# Patient Record
Sex: Female | Born: 1935 | Race: White | Hispanic: No | Smoking: Former smoker
Health system: Southern US, Community
[De-identification: ages and names within clinical notes are randomized; demographics above are authoritative.]

## PROBLEM LIST (undated history)

## (undated) DIAGNOSIS — I480 Paroxysmal atrial fibrillation: Secondary | ICD-10-CM

## (undated) DIAGNOSIS — E119 Type 2 diabetes mellitus without complications: Secondary | ICD-10-CM

## (undated) DIAGNOSIS — I10 Essential (primary) hypertension: Secondary | ICD-10-CM

## (undated) DIAGNOSIS — E785 Hyperlipidemia, unspecified: Secondary | ICD-10-CM

## (undated) DIAGNOSIS — I2510 Atherosclerotic heart disease of native coronary artery without angina pectoris: Secondary | ICD-10-CM

## (undated) DIAGNOSIS — I509 Heart failure, unspecified: Secondary | ICD-10-CM

## (undated) DIAGNOSIS — J449 Chronic obstructive pulmonary disease, unspecified: Secondary | ICD-10-CM

## (undated) HISTORY — PX: BREAST EXCISIONAL BIOPSY: SUR124

## (undated) HISTORY — PX: ABDOMINAL HYSTERECTOMY: SHX81

## (undated) HISTORY — DX: Heart failure, unspecified: I50.9

---

## 2004-05-13 ENCOUNTER — Ambulatory Visit: Payer: Self-pay | Admitting: Internal Medicine

## 2004-06-30 ENCOUNTER — Ambulatory Visit: Payer: Self-pay | Admitting: Unknown Physician Specialty

## 2004-12-10 ENCOUNTER — Ambulatory Visit: Payer: Self-pay | Admitting: Cardiology

## 2005-05-27 ENCOUNTER — Ambulatory Visit: Payer: Self-pay | Admitting: Internal Medicine

## 2005-06-14 ENCOUNTER — Ambulatory Visit: Payer: Self-pay | Admitting: Internal Medicine

## 2005-07-01 ENCOUNTER — Ambulatory Visit: Payer: Self-pay | Admitting: Internal Medicine

## 2005-11-16 ENCOUNTER — Encounter: Payer: Self-pay | Admitting: Internal Medicine

## 2005-12-10 ENCOUNTER — Encounter: Payer: Self-pay | Admitting: Internal Medicine

## 2005-12-20 ENCOUNTER — Ambulatory Visit: Payer: Self-pay | Admitting: Internal Medicine

## 2006-05-30 ENCOUNTER — Ambulatory Visit: Payer: Self-pay | Admitting: Internal Medicine

## 2006-06-11 ENCOUNTER — Ambulatory Visit: Payer: Self-pay | Admitting: Internal Medicine

## 2006-07-12 ENCOUNTER — Ambulatory Visit: Payer: Self-pay | Admitting: Internal Medicine

## 2006-08-04 ENCOUNTER — Ambulatory Visit: Payer: Self-pay | Admitting: Internal Medicine

## 2007-07-12 ENCOUNTER — Emergency Department: Payer: Self-pay | Admitting: Emergency Medicine

## 2007-07-12 ENCOUNTER — Other Ambulatory Visit: Payer: Self-pay

## 2007-10-16 ENCOUNTER — Ambulatory Visit: Payer: Self-pay | Admitting: Internal Medicine

## 2008-11-22 ENCOUNTER — Ambulatory Visit: Payer: Self-pay | Admitting: Nurse Practitioner

## 2009-05-23 ENCOUNTER — Ambulatory Visit: Payer: Self-pay | Admitting: Family Medicine

## 2010-02-18 ENCOUNTER — Ambulatory Visit: Payer: Self-pay | Admitting: Nurse Practitioner

## 2010-05-07 ENCOUNTER — Emergency Department: Payer: Self-pay | Admitting: Emergency Medicine

## 2010-09-25 ENCOUNTER — Ambulatory Visit: Payer: Self-pay | Admitting: Family Medicine

## 2011-08-16 ENCOUNTER — Ambulatory Visit: Payer: Self-pay | Admitting: Unknown Physician Specialty

## 2011-08-18 LAB — PATHOLOGY REPORT

## 2011-12-09 ENCOUNTER — Ambulatory Visit: Payer: Self-pay | Admitting: Family Medicine

## 2013-07-09 ENCOUNTER — Ambulatory Visit: Payer: Self-pay | Admitting: Family Medicine

## 2016-06-25 ENCOUNTER — Other Ambulatory Visit: Payer: Self-pay | Admitting: Family Medicine

## 2016-06-25 DIAGNOSIS — Z1231 Encounter for screening mammogram for malignant neoplasm of breast: Secondary | ICD-10-CM

## 2016-08-03 ENCOUNTER — Ambulatory Visit
Admission: RE | Admit: 2016-08-03 | Discharge: 2016-08-03 | Disposition: A | Discharge status: Home / Self Care | Payer: Medicare Other | Source: Ambulatory Visit | Attending: Family Medicine | Admitting: Family Medicine

## 2016-08-03 ENCOUNTER — Encounter: Payer: Self-pay | Admitting: Radiology

## 2016-08-03 DIAGNOSIS — Z1231 Encounter for screening mammogram for malignant neoplasm of breast: Secondary | ICD-10-CM | POA: Diagnosis not present

## 2017-06-30 ENCOUNTER — Other Ambulatory Visit: Payer: Self-pay | Admitting: Family Medicine

## 2017-06-30 DIAGNOSIS — Z1231 Encounter for screening mammogram for malignant neoplasm of breast: Secondary | ICD-10-CM

## 2018-05-01 ENCOUNTER — Encounter: Payer: Self-pay | Admitting: Internal Medicine

## 2018-05-01 ENCOUNTER — Emergency Department: Payer: Medicare Other

## 2018-05-01 ENCOUNTER — Other Ambulatory Visit: Payer: Self-pay

## 2018-05-01 ENCOUNTER — Inpatient Hospital Stay
Admission: EM | Admit: 2018-05-01 | Discharge: 2018-05-04 | DRG: 871 | Disposition: A | Discharge status: Home Health | Payer: Medicare Other | Attending: Internal Medicine | Admitting: Internal Medicine

## 2018-05-01 DIAGNOSIS — Z7901 Long term (current) use of anticoagulants: Secondary | ICD-10-CM

## 2018-05-01 DIAGNOSIS — I130 Hypertensive heart and chronic kidney disease with heart failure and stage 1 through stage 4 chronic kidney disease, or unspecified chronic kidney disease: Secondary | ICD-10-CM | POA: Diagnosis present

## 2018-05-01 DIAGNOSIS — N179 Acute kidney failure, unspecified: Secondary | ICD-10-CM

## 2018-05-01 DIAGNOSIS — J9601 Acute respiratory failure with hypoxia: Secondary | ICD-10-CM | POA: Diagnosis present

## 2018-05-01 DIAGNOSIS — Z87891 Personal history of nicotine dependence: Secondary | ICD-10-CM

## 2018-05-01 DIAGNOSIS — R0603 Acute respiratory distress: Secondary | ICD-10-CM | POA: Diagnosis not present

## 2018-05-01 DIAGNOSIS — I2510 Atherosclerotic heart disease of native coronary artery without angina pectoris: Secondary | ICD-10-CM | POA: Diagnosis present

## 2018-05-01 DIAGNOSIS — N183 Chronic kidney disease, stage 3 (moderate): Secondary | ICD-10-CM | POA: Diagnosis present

## 2018-05-01 DIAGNOSIS — E785 Hyperlipidemia, unspecified: Secondary | ICD-10-CM | POA: Diagnosis present

## 2018-05-01 DIAGNOSIS — Z515 Encounter for palliative care: Secondary | ICD-10-CM | POA: Diagnosis not present

## 2018-05-01 DIAGNOSIS — D649 Anemia, unspecified: Secondary | ICD-10-CM | POA: Diagnosis present

## 2018-05-01 DIAGNOSIS — J189 Pneumonia, unspecified organism: Secondary | ICD-10-CM

## 2018-05-01 DIAGNOSIS — E119 Type 2 diabetes mellitus without complications: Secondary | ICD-10-CM

## 2018-05-01 DIAGNOSIS — Z23 Encounter for immunization: Secondary | ICD-10-CM

## 2018-05-01 DIAGNOSIS — R6520 Severe sepsis without septic shock: Secondary | ICD-10-CM | POA: Diagnosis present

## 2018-05-01 DIAGNOSIS — Z66 Do not resuscitate: Secondary | ICD-10-CM | POA: Diagnosis present

## 2018-05-01 DIAGNOSIS — J9622 Acute and chronic respiratory failure with hypercapnia: Secondary | ICD-10-CM | POA: Diagnosis not present

## 2018-05-01 DIAGNOSIS — E1122 Type 2 diabetes mellitus with diabetic chronic kidney disease: Secondary | ICD-10-CM | POA: Diagnosis present

## 2018-05-01 DIAGNOSIS — J440 Chronic obstructive pulmonary disease with acute lower respiratory infection: Secondary | ICD-10-CM | POA: Diagnosis present

## 2018-05-01 DIAGNOSIS — J9621 Acute and chronic respiratory failure with hypoxia: Secondary | ICD-10-CM | POA: Diagnosis present

## 2018-05-01 DIAGNOSIS — J9602 Acute respiratory failure with hypercapnia: Secondary | ICD-10-CM | POA: Diagnosis not present

## 2018-05-01 DIAGNOSIS — I350 Nonrheumatic aortic (valve) stenosis: Secondary | ICD-10-CM | POA: Diagnosis present

## 2018-05-01 DIAGNOSIS — I5031 Acute diastolic (congestive) heart failure: Secondary | ICD-10-CM | POA: Diagnosis present

## 2018-05-01 DIAGNOSIS — I480 Paroxysmal atrial fibrillation: Secondary | ICD-10-CM | POA: Diagnosis present

## 2018-05-01 DIAGNOSIS — Z825 Family history of asthma and other chronic lower respiratory diseases: Secondary | ICD-10-CM

## 2018-05-01 DIAGNOSIS — J441 Chronic obstructive pulmonary disease with (acute) exacerbation: Secondary | ICD-10-CM

## 2018-05-01 DIAGNOSIS — A419 Sepsis, unspecified organism: Secondary | ICD-10-CM | POA: Diagnosis present

## 2018-05-01 DIAGNOSIS — R0602 Shortness of breath: Secondary | ICD-10-CM | POA: Diagnosis present

## 2018-05-01 DIAGNOSIS — Z833 Family history of diabetes mellitus: Secondary | ICD-10-CM

## 2018-05-01 DIAGNOSIS — Z800 Family history of malignant neoplasm of digestive organs: Secondary | ICD-10-CM

## 2018-05-01 DIAGNOSIS — D689 Coagulation defect, unspecified: Secondary | ICD-10-CM | POA: Diagnosis present

## 2018-05-01 DIAGNOSIS — I447 Left bundle-branch block, unspecified: Secondary | ICD-10-CM | POA: Diagnosis present

## 2018-05-01 DIAGNOSIS — I10 Essential (primary) hypertension: Secondary | ICD-10-CM | POA: Diagnosis present

## 2018-05-01 DIAGNOSIS — I5021 Acute systolic (congestive) heart failure: Secondary | ICD-10-CM | POA: Diagnosis present

## 2018-05-01 DIAGNOSIS — J9600 Acute respiratory failure, unspecified whether with hypoxia or hypercapnia: Secondary | ICD-10-CM

## 2018-05-01 DIAGNOSIS — Z7189 Other specified counseling: Secondary | ICD-10-CM | POA: Diagnosis not present

## 2018-05-01 HISTORY — DX: Essential (primary) hypertension: I10

## 2018-05-01 HISTORY — DX: Atherosclerotic heart disease of native coronary artery without angina pectoris: I25.10

## 2018-05-01 HISTORY — DX: Hyperlipidemia, unspecified: E78.5

## 2018-05-01 HISTORY — DX: Type 2 diabetes mellitus without complications: E11.9

## 2018-05-01 HISTORY — DX: Paroxysmal atrial fibrillation: I48.0

## 2018-05-01 HISTORY — DX: Chronic obstructive pulmonary disease, unspecified: J44.9

## 2018-05-01 LAB — URINALYSIS, ROUTINE W REFLEX MICROSCOPIC
Bacteria, UA: NONE SEEN
Bilirubin Urine: NEGATIVE
Glucose, UA: NEGATIVE mg/dL
Ketones, ur: NEGATIVE mg/dL
LEUKOCYTES UA: NEGATIVE
Nitrite: NEGATIVE
PH: 5 (ref 5.0–8.0)
Protein, ur: NEGATIVE mg/dL
SPECIFIC GRAVITY, URINE: 1.006 (ref 1.005–1.030)
SQUAMOUS EPITHELIAL / LPF: NONE SEEN (ref 0–5)

## 2018-05-01 LAB — COMPREHENSIVE METABOLIC PANEL
ALBUMIN: 3.3 g/dL — AB (ref 3.5–5.0)
ALT: 50 U/L — ABNORMAL HIGH (ref 0–44)
AST: 76 U/L — AB (ref 15–41)
Alkaline Phosphatase: 79 U/L (ref 38–126)
Anion gap: 13 (ref 5–15)
BILIRUBIN TOTAL: 0.6 mg/dL (ref 0.3–1.2)
BUN: 20 mg/dL (ref 8–23)
CHLORIDE: 98 mmol/L (ref 98–111)
CO2: 23 mmol/L (ref 22–32)
Calcium: 8.9 mg/dL (ref 8.9–10.3)
Creatinine, Ser: 1.63 mg/dL — ABNORMAL HIGH (ref 0.44–1.00)
GFR calc Af Amer: 33 mL/min — ABNORMAL LOW (ref >60)
GFR calc non Af Amer: 29 mL/min — ABNORMAL LOW (ref >60)
GLUCOSE: 255 mg/dL — AB (ref 70–99)
POTASSIUM: 4.4 mmol/L (ref 3.5–5.1)
Sodium: 134 mmol/L — ABNORMAL LOW (ref 135–145)
TOTAL PROTEIN: 6.7 g/dL (ref 6.5–8.1)

## 2018-05-01 LAB — CBC
HCT: 41.7 % (ref 36.0–46.0)
HEMOGLOBIN: 12.4 g/dL (ref 12.0–15.0)
MCH: 26.9 pg (ref 26.0–34.0)
MCHC: 29.7 g/dL — ABNORMAL LOW (ref 30.0–36.0)
MCV: 90.5 fL (ref 80.0–100.0)
Platelets: 247 10*3/uL (ref 150–400)
RBC: 4.61 MIL/uL (ref 3.87–5.11)
RDW: 15.3 % (ref 11.5–15.5)
WBC: 15.8 10*3/uL — ABNORMAL HIGH (ref 4.0–10.5)
nRBC: 0 % (ref 0.0–0.2)

## 2018-05-01 LAB — GLUCOSE, CAPILLARY: GLUCOSE-CAPILLARY: 225 mg/dL — AB (ref 70–99)

## 2018-05-01 LAB — PROTIME-INR
INR: 2
Prothrombin Time: 22.4 seconds — ABNORMAL HIGH (ref 11.4–15.2)

## 2018-05-01 LAB — LACTIC ACID, PLASMA: Lactic Acid, Venous: 3 mmol/L (ref 0.5–1.9)

## 2018-05-01 LAB — TROPONIN I: TROPONIN I: 0.03 ng/mL — AB (ref <0.03)

## 2018-05-01 MED ORDER — ACETAMINOPHEN 650 MG RE SUPP: 650.0000 mg | Freq: Four times a day (QID) | RECTAL | Status: DC | PRN

## 2018-05-01 MED ORDER — NYSTATIN 100000 UNIT/GM EX CREA
TOPICAL_CREAM | Freq: Two times a day (BID) | CUTANEOUS | Status: DC
  Administered 2018-05-02 – 2018-05-04 (×5): via TOPICAL
  Filled 2018-05-01: qty 15

## 2018-05-01 MED ORDER — FUROSEMIDE 10 MG/ML IJ SOLN
60.0000 mg | Freq: Once | INTRAMUSCULAR | Status: AC
  Administered 2018-05-01: 60 mg via INTRAVENOUS
  Filled 2018-05-01: qty 8

## 2018-05-01 MED ORDER — ONDANSETRON HCL 4 MG/2ML IJ SOLN: 4.0000 mg | Freq: Four times a day (QID) | INTRAMUSCULAR | Status: DC | PRN

## 2018-05-01 MED ORDER — ACETAMINOPHEN 650 MG RE SUPP
RECTAL | Status: AC
  Administered 2018-05-01: 650 mg
  Filled 2018-05-01: qty 1

## 2018-05-01 MED ORDER — IPRATROPIUM-ALBUTEROL 0.5-2.5 (3) MG/3ML IN SOLN: 3.0000 mL | RESPIRATORY_TRACT | Status: DC | PRN

## 2018-05-01 MED ORDER — IPRATROPIUM-ALBUTEROL 0.5-2.5 (3) MG/3ML IN SOLN
9.0000 mL | Freq: Once | RESPIRATORY_TRACT | Status: AC
  Administered 2018-05-01: 9 mL via RESPIRATORY_TRACT

## 2018-05-01 MED ORDER — SODIUM CHLORIDE 0.9 % IV SOLN
2.0000 g | Freq: Once | INTRAVENOUS | Status: AC
  Administered 2018-05-01: 2 g via INTRAVENOUS
  Filled 2018-05-01: qty 2

## 2018-05-01 MED ORDER — MOMETASONE FURO-FORMOTEROL FUM 100-5 MCG/ACT IN AERO
2.0000 | INHALATION_SPRAY | Freq: Two times a day (BID) | RESPIRATORY_TRACT | Status: DC
  Filled 2018-05-01: qty 8.8

## 2018-05-01 MED ORDER — ACETAMINOPHEN 325 MG PO TABS
650.0000 mg | ORAL_TABLET | Freq: Four times a day (QID) | ORAL | Status: DC | PRN
  Administered 2018-05-02 – 2018-05-04 (×3): 650 mg via ORAL
  Filled 2018-05-01 (×3): qty 2

## 2018-05-01 MED ORDER — SODIUM CHLORIDE 0.9 % IV SOLN: 1.0000 g | Freq: Three times a day (TID) | INTRAVENOUS | Status: DC

## 2018-05-01 MED ORDER — SODIUM CHLORIDE 0.9 % IV SOLN
2.0000 g | INTRAVENOUS | Status: DC
  Filled 2018-05-01: qty 2

## 2018-05-01 MED ORDER — INSULIN ASPART 100 UNIT/ML ~~LOC~~ SOLN
0.0000 [IU] | SUBCUTANEOUS | Status: DC
  Administered 2018-05-01 – 2018-05-02 (×2): 3 [IU] via SUBCUTANEOUS
  Administered 2018-05-02: 5 [IU] via SUBCUTANEOUS
  Administered 2018-05-02: 2 [IU] via SUBCUTANEOUS
  Administered 2018-05-02 – 2018-05-03 (×2): 1 [IU] via SUBCUTANEOUS
  Filled 2018-05-01 (×6): qty 1

## 2018-05-01 MED ORDER — METHYLPREDNISOLONE SODIUM SUCC 125 MG IJ SOLR
60.0000 mg | Freq: Four times a day (QID) | INTRAMUSCULAR | Status: DC
  Administered 2018-05-02 (×2): 60 mg via INTRAVENOUS
  Filled 2018-05-01 (×2): qty 2

## 2018-05-01 MED ORDER — INFLUENZA VAC SPLIT HIGH-DOSE 0.5 ML IM SUSY
0.5000 mL | PREFILLED_SYRINGE | INTRAMUSCULAR | Status: AC
  Administered 2018-05-02: 0.5 mL via INTRAMUSCULAR
  Filled 2018-05-01 (×2): qty 0.5

## 2018-05-01 MED ORDER — ONDANSETRON HCL 4 MG PO TABS: 4.0000 mg | ORAL_TABLET | Freq: Four times a day (QID) | ORAL | Status: DC | PRN

## 2018-05-01 MED ORDER — SODIUM CHLORIDE 0.9 % IV BOLUS
1000.0000 mL | Freq: Once | INTRAVENOUS | Status: AC
  Administered 2018-05-01: 1000 mL via INTRAVENOUS

## 2018-05-01 MED ORDER — VANCOMYCIN HCL IN DEXTROSE 1-5 GM/200ML-% IV SOLN
1000.0000 mg | INTRAVENOUS | Status: DC
  Administered 2018-05-02: 1000 mg via INTRAVENOUS
  Filled 2018-05-01: qty 200

## 2018-05-01 MED ORDER — VANCOMYCIN HCL IN DEXTROSE 1-5 GM/200ML-% IV SOLN
1000.0000 mg | Freq: Once | INTRAVENOUS | Status: AC
  Administered 2018-05-01: 1000 mg via INTRAVENOUS
  Filled 2018-05-01: qty 200

## 2018-05-01 MED ORDER — AMIODARONE IV BOLUS ONLY 150 MG/100ML: 150.0000 mg | Freq: Once | INTRAVENOUS | Status: DC

## 2018-05-01 MED ORDER — METHYLPREDNISOLONE SODIUM SUCC 125 MG IJ SOLR
125.0000 mg | Freq: Once | INTRAMUSCULAR | Status: AC
  Administered 2018-05-01: 125 mg via INTRAVENOUS
  Filled 2018-05-01: qty 2

## 2018-05-01 NOTE — ED Notes (Signed)
Foley inserted by this EDT and Amy, RN. Urine sample sent to lab upon insertion. Tolerated well.

## 2018-05-01 NOTE — H&P (Signed)
[facility] NAME: Ashley Bird    MR#:  [id number]  DATE OF BIRTH:  02-21-1936  DATE OF ADMISSION:  05/01/2018  PRIMARY CARE PHYSICIAN: Teodoro Spray, MD   REQUESTING/REFERRING PHYSICIAN: Quentin Cornwall, MD  CHIEF COMPLAINT:   Chief Complaint  Patient presents with  . Shortness of Breath    HISTORY OF PRESENT ILLNESS:  Ashley Bird  is a 82 y.o. female who presents with chief complaint as above.  Patient and family at bedside both state that she began to feel ill 3 days ago.  Her daughter had some upper respiratory infection, likely viral, and the patient started having similar symptoms 3 days ago.  Her cough progressed, and today in the afternoon she became severely short of breath.  She used nebulizer treatments at home without any beneficial effect.  She states that she had a few episodes where she became very lethargic and less responsive.  That was when she came to the hospital.  Here she was found to have pulmonary edema on x-ray, likely pneumonia given elevated white blood cell count, she meets sepsis criteria, likely COPD exacerbation given both hypoxia and hypercarbia.  She was placed on BiPAP, given antibiotics, and hospitalist were called for admission  PAST MEDICAL HISTORY:   Past Medical History:  Diagnosis Date  . CAD (coronary artery disease)   . COPD (chronic obstructive pulmonary disease) ([place])   . Diabetes ([place])   . HLD (hyperlipidemia)   . HTN (hypertension)   . PAF (paroxysmal atrial fibrillation) ([place])      PAST SURGICAL HISTORY:   Past Surgical History:  Procedure Laterality Date  . ABDOMINAL HYSTERECTOMY    . BREAST EXCISIONAL BIOPSY Left 40 yrs ago   neg  . BREAST EXCISIONAL BIOPSY Left 40 yrs ago   neg     SOCIAL HISTORY:   Social History   Tobacco Use  . Smoking status: Former Smoker  Substance Use Topics  . Alcohol use: Not Currently     FAMILY HISTORY:   Family History   Problem Relation Age of Onset  . Colon cancer Mother   . Diabetes Mother   . Asthma Father      DRUG ALLERGIES:  Not on File  MEDICATIONS AT HOME:   Prior to Admission medications   Medication Sig Start Date End Date Taking? Authorizing Provider  ADVAIR DISKUS 100-50 MCG/DOSE AEPB Inhale 1 puff into the lungs every 12 (twelve) hours. 02/23/18   [provider]  amiodarone (PACERONE) 200 MG tablet Take 200 mg by mouth daily. 03/03/18   [provider]  metoprolol succinate (TOPROL-XL) 50 MG 24 hr tablet Take 50 mg by mouth daily. 05/01/18   [provider]  olmesartan-hydrochlorothiazide (BENICAR HCT) 40-25 MG tablet Take 1 tablet by mouth daily. 01/26/18   [provider]  warfarin (COUMADIN) 2 MG tablet Take 2 mg by mouth as directed. Take on Sunday, Tuesday, Wednesday, Thursday, Friday, Saturday 04/17/18   [provider]  warfarin (COUMADIN) 3 MG tablet Take 3 mg by mouth once a week. On Monday 03/02/18   [provider]    REVIEW OF SYSTEMS:  Review of Systems  Constitutional: Negative for chills, fever, malaise/fatigue and weight loss.  HENT: Negative for ear pain, hearing loss and tinnitus.   Eyes: Negative for blurred vision, double vision, pain and redness.  Respiratory: Positive for cough, shortness of breath and wheezing. Negative for hemoptysis.   Cardiovascular: Negative for  chest pain, palpitations, orthopnea and leg swelling.  Gastrointestinal: Negative for abdominal pain, constipation, diarrhea, nausea and vomiting.  Genitourinary: Negative for dysuria, frequency and hematuria.  Musculoskeletal: Negative for back pain, joint pain and neck pain.  Skin:       No acne, rash, or lesions  Neurological: Negative for dizziness, tremors, focal weakness and weakness.  Endo/Heme/Allergies: Negative for polydipsia. Does not bruise/bleed easily.  Psychiatric/Behavioral: Negative for depression. The patient is not  nervous/anxious and does not have insomnia.      VITAL SIGNS:   Vitals:   05/01/18 1940 05/01/18 2030 05/01/18 2105 05/01/18 2109  BP:   112/72   Pulse:  (!) 116 (!) 112   Resp:  18 19   Temp: (!) 102.7 F (39.3 C)   100.2 F (37.9 C)  TempSrc: Rectal   Rectal  SpO2:  99% 100%   Weight:      Height:       Wt Readings from Last 3 Encounters:  05/01/18 88.9 kg    PHYSICAL EXAMINATION:  Physical Exam  Vitals reviewed. Constitutional: She is oriented to person, place, and time. She appears well-developed and well-nourished. No distress.  HENT:  Head: Normocephalic and atraumatic.  Mouth/Throat: Oropharynx is clear and moist.  Eyes: Pupils are equal, round, and reactive to light. Conjunctivae and EOM are normal. No scleral icterus.  Neck: Normal range of motion. Neck supple. No JVD present. No thyromegaly present.  Cardiovascular: Intact distal pulses. Exam reveals no gallop and no friction rub.  No murmur heard. Tachycardic, irregular rhythm  Respiratory: She is in respiratory distress. She has no wheezes. She has rales.  Rhonchi, right greater than left  GI: Soft. Bowel sounds are normal. She exhibits no distension. There is no tenderness.  Musculoskeletal: Normal range of motion. She exhibits no edema.  No arthritis, no gout  Lymphadenopathy:    She has no cervical adenopathy.  Neurological: She is alert and oriented to person, place, and time. No cranial nerve deficit.  No dysarthria, no aphasia  Skin: Skin is warm and dry. No rash noted. No erythema.  Psychiatric: She has a normal mood and affect. Her behavior is normal. Judgment and thought content normal.    LABORATORY PANEL:   CBC Recent Labs  Lab 05/01/18 1837  WBC 15.8*  HGB 12.4  HCT 41.7  PLT 247   ------------------------------------------------------------------------------------------------------------------  Chemistries  Recent Labs  Lab 05/01/18 1837  NA 134*  K 4.4  CL 98  CO2 23   GLUCOSE 255*  BUN 20  CREATININE 1.63*  CALCIUM 8.9  AST 76*  ALT 50*  ALKPHOS 79  BILITOT 0.6   ------------------------------------------------------------------------------------------------------------------  Cardiac Enzymes Recent Labs  Lab 05/01/18 1837  TROPONINI 0.03*   ------------------------------------------------------------------------------------------------------------------  RADIOLOGY:  Dg Chest 1 View  Result Date: 05/01/2018 CLINICAL DATA:  Shortness of breath, chest pain EXAM: CHEST  1 VIEW COMPARISON:  07/12/2007 FINDINGS: Cardiomegaly with vascular congestion and interstitial prominence concerning for interstitial edema. No visible significant effusions or acute bony abnormality. IMPRESSION: Cardiomegaly, vascular congestion, probable mild interstitial edema. Electronically Signed   By: Rolm Baptise M.D.   On: 05/01/2018 19:12    EKG:   Orders placed or performed during the hospital encounter of 05/01/18  . ED EKG within 10 minutes  . ED EKG within 10 minutes  . EKG 12-Lead  . EKG 12-Lead  . EKG 12-Lead  . EKG 12-Lead  . EKG 12-Lead  . EKG 12-Lead  . EKG 12-Lead  .  EKG 12-Lead    IMPRESSION AND PLAN:  Principal Problem:   Acute respiratory failure with hypoxia and hypercapnia (HCC) -patient placed on BiPAP with some improvement in her respiratory status.  She certainly had improvement in her oxygenation.  We will keep her on BiPAP for now, admit to ICU, treatment of other problems as below Active Problems:   Acute systolic CHF (congestive heart failure) ([place]) -patient does not have a history of chronic CHF.  She was given a dose of IV Lasix in the ED and states that this improved her breathing some tonight.  We will get an echocardiogram.  If this is abnormal she will need a cardiology consult   Severe sepsis ([place]) -IV antibiotics given, troponin was barely elevated at 0.03, trend cardiac enzymes tonight, lactic acid was elevated, but she was  diuresed initially as above, continue to trend lactate until within normal limits, blood pressure stable though on the soft side, cultures sent   CAP (community acquired pneumonia) -IV antibiotics as above, supportive treatment   COPD with acute exacerbation (HCC) -IV steroids, IV antibiotics, duo nebs and home inhalers, other treatment as above   Diabetes (HCC) -sliding scale insulin with corresponding glucose checks   HTN (hypertension) -hold antihypertensives for now as the patient's blood pressure is borderline low   PAF (paroxysmal atrial fibrillation) ([place]) -continue home meds including anticoagulation   HLD (hyperlipidemia) -Home dose antilipid  Chart review performed and case discussed with ED provider. Labs, imaging and/or ECG reviewed by provider and discussed with patient/family. Management plans discussed with the patient and/or family.  DVT PROPHYLAXIS: Systemic anticoagulation  GI PROPHYLAXIS:  None  ADMISSION STATUS: Inpatient     CODE STATUS: Full  TOTAL CRITICAL CARE TIME TAKING CARE OF THIS PATIENT: 50 minutes.   Hollin Crewe [place] 05/01/2018, 10:09 PM  [employer]  [phone]  CC: Primary care physician; Teodoro Spray, MD  Note:  This document was prepared using Dragon voice recognition software and may include unintentional dictation errors.

## 2018-05-01 NOTE — Consult Note (Addendum)
Name: Ashley Bird MRN: [id number] DOB: 1935/12/01    ADMISSION DATE:  05/01/2018 CONSULTATION DATE: 05/01/2018  REFERRING MD : Dr. Jannifer Franklin   CHIEF COMPLAINT: Shortness of Breath   BRIEF PATIENT DESCRIPTION:  82 yo female admitted with acute on chronic hypercapnic respiratory failure secondary to AECOP, pulmonary edema, and possible pneumonia requiring Bipap   SIGNIFICANT EVENTS/STUDIES:  10/21-Pt admitted to ICU   HISTORY OF PRESENT ILLNESS:   This is an 82 yo female with a PMH of Paroxysmal Atrial Fibrillation, HTN, HLD, Type II Diabetes Mellitus, COPD, and CAD.  She presented to [facility] ER on 10/21 via EMS with c/o worsening shortness of breath. Per ER notes the pt has been compliant with her outpatient medications.  En route to the ER EMS administered 1 duoneb treatment, 125 mg iv solumedrol, and 4 mg of zofran however she remained in severe respiratory distress.  Upon arrival to the ER she required Bipap.  CXR revealed pulmonary edema, she received 60 mg iv lasix.  She was also febrile with temp of 102.7 F and tachycardic hr 120's meeting sepsis protocol.  She received 1L NS bolus, cefepime, and vancomycin.  She was subsequently admitted to ICU by hospitalist team for further workup and treatment.    PAST MEDICAL HISTORY :   has a past medical history of CAD (coronary artery disease), COPD (chronic obstructive pulmonary disease) ([place]), Diabetes ([place]), HLD (hyperlipidemia), HTN (hypertension), and PAF (paroxysmal atrial fibrillation) ([place]).  has a past surgical history that includes Breast excisional biopsy (Left, 40 yrs ago); Breast excisional biopsy (Left, 40 yrs ago); and Abdominal hysterectomy. Prior to Admission medications   Medication Sig Start Date End Date Taking? Authorizing Provider  ADVAIR DISKUS 100-50 MCG/DOSE AEPB Inhale 1 puff into the lungs every 12 (twelve) hours. 02/23/18   [provider]  amiodarone (PACERONE) 200 MG tablet Take 200 mg by mouth daily. 03/03/18    [provider]  metoprolol succinate (TOPROL-XL) 50 MG 24 hr tablet Take 50 mg by mouth daily. 05/01/18   [provider]  olmesartan-hydrochlorothiazide (BENICAR HCT) 40-25 MG tablet Take 1 tablet by mouth daily. 01/26/18   [provider]  warfarin (COUMADIN) 2 MG tablet Take 2 mg by mouth as directed. Take on Sunday, Tuesday, Wednesday, Thursday, Friday, Saturday 04/17/18   [provider]  warfarin (COUMADIN) 3 MG tablet Take 3 mg by mouth once a week. On Monday 03/02/18   [provider]   Not on File  FAMILY HISTORY:  family history includes Asthma in her father; Colon cancer in her mother; Diabetes in her mother. SOCIAL HISTORY:  reports that she has quit smoking. She does not have any smokeless tobacco history on file. She reports that she drank alcohol.  REVIEW OF SYSTEMS: Positives in BOLD   Constitutional: fever, chills, weight loss, malaise/fatigue and diaphoresis.  HENT: Negative for hearing loss, ear pain, nosebleeds, congestion, sore throat, neck pain, tinnitus and ear discharge.   Eyes: Negative for blurred vision, double vision, photophobia, pain, discharge and redness.  Respiratory: cough, hemoptysis, sputum production, shortness of breath, wheezing and stridor.   Cardiovascular: Negative for chest pain, palpitations, orthopnea, claudication, leg swelling and PND.  Gastrointestinal: Negative for heartburn, nausea, vomiting, abdominal pain, diarrhea, constipation, blood in stool and melena.  Genitourinary: Negative for dysuria, urgency, frequency, hematuria and flank pain.  Musculoskeletal: Negative for myalgias, back pain, joint pain and falls.  Skin: Negative for itching and rash.  Neurological: Negative for dizziness, tingling, tremors, sensory change,  speech change, focal weakness, seizures, loss of consciousness, weakness and headaches.  Endo/Heme/Allergies: Negative for environmental allergies and polydipsia. Does not  bruise/bleed easily.  SUBJECTIVE:  Pt states breathing has improved   VITAL SIGNS: Temp:  [100.2 F (37.9 C)-102.7 F (39.3 C)] 100.2 F (37.9 C) (10/21 2109) Pulse Rate:  [112-135] 112 (10/21 2105) Resp:  [18-36] 19 (10/21 2105) BP: (112-136)/(72-114) 112/72 (10/21 2105) SpO2:  [97 %-100 %] 100 % (10/21 2105) Weight:  [88.9 kg] 88.9 kg (10/21 1856)  PHYSICAL EXAMINATION: General: acutely ill appearing female, NAD on Bipap  Neuro: alert and oriented, follows commands  HEENT: supple, no JVD  Cardiovascular: irregular irregular, no R/G  Lungs: diminished throughout, even, non labored  Abdomen: +BS x4, soft, non distended, non tender   Musculoskeletal: normal bulk and tone, no edema  Skin: abdominal fold rash   Recent Labs  Lab 05/01/18 1837  NA 134*  K 4.4  CL 98  CO2 23  BUN 20  CREATININE 1.63*  GLUCOSE 255*   Recent Labs  Lab 05/01/18 1837  HGB 12.4  HCT 41.7  WBC 15.8*  PLT 247   Dg Chest 1 View  Result Date: 05/01/2018 CLINICAL DATA:  Shortness of breath, chest pain EXAM: CHEST  1 VIEW COMPARISON:  07/12/2007 FINDINGS: Cardiomegaly with vascular congestion and interstitial prominence concerning for interstitial edema. No visible significant effusions or acute bony abnormality. IMPRESSION: Cardiomegaly, vascular congestion, probable mild interstitial edema. Electronically Signed   By: Rolm Baptise M.D.   On: 05/01/2018 19:12    ASSESSMENT / PLAN:  Acute on chronic hypercapnic respiratory failure secondary to pulmonary edema and AECOPD Possible pneumonia  Prn Bipap for dyspnea and/or hypoxia  Scheduled and prn bronchodilator therapy IV steroids Repeat CXR in am   Elevated troponin likely demand ischemia in setting of respiratory failure  Paroxysmal atrial fibrillation  Continuous telemetry monitoring  Trend troponin's  BNP and Echo pending  IV lasix as bp permits  VTE px: coumadin Trend CBC Monitor for s/sx bleeding and transfuse for hgb  <7  Acute on chronic renal failure  Lactic acidosis  Trend BMP  Replace electrolytes as indicated  Monitor UOP Avoid nephrotoxic medications   Sepsis  Leukocytosis secondary to possible pneumonia  Trend WBC and monitor fever curve  Trend PCT and lactic acid  Follow cultures  Continue cefepime and vancomycin if MRSA PCR negative will discontinue vancomycin   Type II Diabetes Mellitus CBG's ac/hs SSI   Marda Stalker, [place] Pager [phone] (please enter 7 digits) PCCM Consult Pager [phone] (please enter 7 digits)   I agree with the documented

## 2018-05-01 NOTE — ED Notes (Signed)
Family at bedside. 

## 2018-05-01 NOTE — ED Notes (Signed)
MD Robinson at bedside 

## 2018-05-01 NOTE — ED Provider Notes (Signed)
[facility] Emergency Department Provider Note    First MD Initiated Contact with Patient 05/01/18 1830     (approximate)  I have reviewed the triage vital signs and the nursing notes.   HISTORY  Chief Complaint Shortness of Breath  Level V Caveat:  Respiratory distress  HPI Ashley Bird is a 82 y.o. female presents to the ER in extremis.  Patient reportedly called EMS due to worsening shortness of breath that started around noon today.  Is having some chest pain.  Primary complaint right now is worsening shortness of breath.  States she is been taking her "fluid pill ".  Took some nebulizers in route with some improvement.  Patient with severe respiratory distress requiring submental oxygen unable to speak in more than 1 or 2 word responses.  Denies any abdominal pain.    No past medical history on file. No family history on file. Past Surgical History:  Procedure Laterality Date  . BREAST EXCISIONAL BIOPSY Left 40 yrs ago   neg  . BREAST EXCISIONAL BIOPSY Left 40 yrs ago   neg   There are no active problems to display for this patient.     Prior to Admission medications   Not on File    Allergies Patient has no allergy information on record.    Social History Social History   Tobacco Use  . Smoking status: Not on file  Substance Use Topics  . Alcohol use: Not on file  . Drug use: Not on file    Review of Systems Patient denies headaches, rhinorrhea, blurry vision, numbness, shortness of breath, chest pain, edema, cough, abdominal pain, nausea, vomiting, diarrhea, dysuria, fevers, rashes or hallucinations unless otherwise stated above in HPI. ____________________________________________   PHYSICAL EXAM:  VITAL SIGNS: Vitals:   05/01/18 1940 05/01/18 2030  BP:    Pulse:  (!) 116  Resp:  18  Temp: (!) 102.7 F (39.3 C)   SpO2:  99%    Constitutional: Alert and oriented. Critically ill appearing Eyes: Conjunctivae are  normal.  Head: Atraumatic. Nose: No congestion/rhinnorhea. Mouth/Throat: Mucous membranes are moist.   Neck: No stridor. Painless ROM.  Cardiovascular: tachycardic, regular rhythm. Grossly normal heart sounds.  Good peripheral circulation. Respiratory: Normal respiratory effort.  No retractions. Lungs CTAB. Gastrointestinal: Soft and nontender. No distention. No abdominal bruits. No CVA tenderness. Genitourinary: normal external genitalia Musculoskeletal: No lower extremity tenderness nor edema.  No joint effusions. Neurologic:  Normal speech and language. No gross focal neurologic deficits are appreciated. No facial droop Skin:  Skin is warm, dry and intact. No rash noted. Psychiatric: Mood and affect are normal. Speech and behavior are normal.  ____________________________________________   LABS (all labs ordered are listed, but only abnormal results are displayed)  Results for orders placed or performed during the hospital encounter of 05/01/18 (from the past 24 hour(s))  Lactic acid, plasma     Status: Abnormal   Collection Time: 05/01/18  6:34 PM  Result Value Ref Range   Lactic Acid, Venous 3.0 (HH) 0.5 - 1.9 mmol/L  CBC     Status: Abnormal   Collection Time: 05/01/18  6:37 PM  Result Value Ref Range   WBC 15.8 (H) 4.0 - 10.5 K/uL   RBC 4.61 3.87 - 5.11 MIL/uL   Hemoglobin 12.4 12.0 - 15.0 g/dL   HCT 41.7 36.0 - 46.0 %   MCV 90.5 80.0 - 100.0 fL   MCH 26.9 26.0 - 34.0 pg   MCHC 29.7 (  L) 30.0 - 36.0 g/dL   RDW 15.3 11.5 - 15.5 %   Platelets 247 150 - 400 K/uL   nRBC 0.0 0.0 - 0.2 %  Troponin I     Status: Abnormal   Collection Time: 05/01/18  6:37 PM  Result Value Ref Range   Troponin I 0.03 (HH) <0.03 ng/mL  Comprehensive metabolic panel     Status: Abnormal   Collection Time: 05/01/18  6:37 PM  Result Value Ref Range   Sodium 134 (L) 135 - 145 mmol/L   Potassium 4.4 3.5 - 5.1 mmol/L   Chloride 98 98 - 111 mmol/L   CO2 23 22 - 32 mmol/L   Glucose, Bld 255 (H) 70  - 99 mg/dL   BUN 20 8 - 23 mg/dL   Creatinine, Ser 1.63 (H) 0.44 - 1.00 mg/dL   Calcium 8.9 8.9 - 10.3 mg/dL   Total Protein 6.7 6.5 - 8.1 g/dL   Albumin 3.3 (L) 3.5 - 5.0 g/dL   AST 76 (H) 15 - 41 U/L   ALT 50 (H) 0 - 44 U/L   Alkaline Phosphatase 79 38 - 126 U/L   Total Bilirubin 0.6 0.3 - 1.2 mg/dL   GFR calc non Af Amer 29 (L) >60 mL/min   GFR calc Af Amer 33 (L) >60 mL/min   Anion gap 13 5 - 15  Blood gas, venous     Status: Abnormal (Preliminary result)   Collection Time: 05/01/18  6:49 PM  Result Value Ref Range   FIO2 1.00    Delivery systems NON-REBREATHER OXYGEN MASK    pH, Ven 7.15 (LL) 7.250 - 7.430   pCO2, Ven 78 (HH) 44.0 - 60.0 mmHg   pO2, Ven PENDING 32.0 - 45.0 mmHg   Bicarbonate 27.2 20.0 - 28.0 mmol/L   Acid-base deficit 3.4 (H) 0.0 - 2.0 mmol/L   O2 Saturation PENDING %   Patient temperature 37.0    Collection site VEIN    Sample type VEIN    ____________________________________________  EKG My review and personal interpretation at Time: 18:30   Indication: resp distress  Rate: 125  Rhythm: afib Axis: left Other: lbbb, abnormal ekg ____________________________________________  RADIOLOGY  I personally reviewed all radiographic images ordered to evaluate for the above acute complaints and reviewed radiology reports and findings.  These findings were personally discussed with the patient.  Please see medical record for radiology report.  ____________________________________________   PROCEDURES  Procedure(s) performed:  .Critical Care Performed by: Merlyn Lot, MD Authorized by: Merlyn Lot, MD   Critical care provider statement:    Critical care time (minutes):  48   Critical care time was exclusive of:  Separately billable procedures and treating other patients   Critical care was time spent personally by me on the following activities:  Development of treatment plan with patient or surrogate, discussions with consultants, evaluation  of patient's response to treatment, examination of patient, obtaining history from patient or surrogate, ordering and performing treatments and interventions, ordering and review of laboratory studies, ordering and review of radiographic studies, pulse oximetry, re-evaluation of patient's condition and review of old charts      Critical Care performed: yes ____________________________________________   INITIAL IMPRESSION / ASSESSMENT AND PLAN / ED COURSE  Pertinent labs & imaging results that were available during my care of the patient were reviewed by me and considered in my medical decision making (see chart for details).   DDX: Asthma, copd, CHF, pna, ptx, malignancy, Pe, anemia  BRODY KUMP is a 82 y.o. who presents to the ED with respiratory distress as described above.  Patient placed on BiPAP due to extremis.  Complex presentation given her history of CHF as well as COPD.  Certainly sounds like COPD on exam.  Letter will be sent for the above differentials.  Will start nebulizer treatment steroids.  Clinical Course as of May 01 2036  Mon May 01, 2018  1904 Patient on BiPAP and feels much more comfortable.   [PR]  1907 Patient with hypercapnia.  She states that she feels better is not having any chest pain right now.  EKG does show new left bundle but I do not have previous EKG since 2011.  Doubt ACS given primary symptoms related to respiratory distress.   [PR]  1943 Patient found to be febrile 102.  Will start broad-spectrum IV antibiotics.   Patient did receive IV Lasix.  Blood pressure is stable.  Will also give IV fluid due to elevated lactate.  Basic metabolic panel [PR]  6759 Patient reassessed.  Does appear much better.  Is now having some diuresis and does appear that her respiratory rate is improved.  Heart rate also coming down.  I do feel the lactic acidosis is predominantly secondary to respiratory drive.  Denies any chest pain at this time.  Is given very gentle IV  hydration as I do not want to worsen any volume overload as she does have the most improvement just with Lasix.  Will be covered for IV antibiotics.  Will discuss with hospitalist for admission.   [PR]    Clinical Course User Index [PR] Merlyn Lot, MD     As part of my medical decision making, I reviewed the following data within the [place] notes reviewed and incorporated, Labs reviewed, notes from prior ED visits .  ____________________________________________   FINAL CLINICAL IMPRESSION(S) / ED DIAGNOSES  Final diagnoses:  Acute respiratory distress  Chronic obstructive pulmonary disease with acute exacerbation (HCC)  Sepsis with acute hypercapnic respiratory failure, due to unspecified organism, unspecified whether septic shock present ([place])      NEW MEDICATIONS STARTED DURING THIS VISIT:  New Prescriptions   No medications on file     Note:  This document was prepared using Dragon voice recognition software and may include unintentional dictation errors.    Merlyn Lot, MD 05/01/18 2038

## 2018-05-01 NOTE — Progress Notes (Signed)
CODE SEPSIS - PHARMACY COMMUNICATION  **Broad Spectrum Antibiotics should be administered within 1 hour of Sepsis diagnosis**  Time Code Sepsis Called/Page Received: 1944  Antibiotics Ordered: Cefepime and vancomycin  Time of 1st antibiotic administration: 1956  Additional action taken by pharmacy: n/a  If necessary, Name of Provider/Nurse Contacted: [place] ,PharmD Clinical Pharmacist  05/01/2018  7:50 PM

## 2018-05-01 NOTE — Consult Note (Signed)
Pharmacy Antibiotic Note  Ashley Bird is a 82 y.o. female admitted on 05/01/2018 with pneumonia.  Pharmacy has been consulted for cefepime and vancomycin dosing.  Plan: Vancomycin 1000 IV once followed by 10.5 hr stacked dosing vancomycin 1000 mg IV every 36 hours.  Goal trough 15-20 mcg/mL.  Will draw trough prior to the fifth dose.  Cefepime 2 gm every 24 hours  Height: 5\' 1"  (154.9 cm) Weight: 196 lb (88.9 kg) IBW/kg (Calculated) : 47.8  Temp (24hrs), Avg:102.7 F (39.3 C), Min:102.7 F (39.3 C), Max:102.7 F (39.3 C)  Recent Labs  Lab 05/01/18 1834 05/01/18 1837  WBC  --  15.8*  CREATININE  --  1.63*  LATICACIDVEN 3.0*  --     Estimated Creatinine Clearance: 27.9 mL/min (A) (by C-G formula based on SCr of 1.63 mg/dL (H)).    Not on File  Antimicrobials this admission: cefepime 10/21 >>  Vanco 10/21 >>   Dose adjustments this admission:   Microbiology results: 10/21 BCx: pending 10/21 UCx:  pending  Sputum:   10/21 MRSA PCR: pending  Thank you for allowing pharmacy to be a part of this patient's care.  Forrest Moron, PharmD 05/01/2018 8:22 PM

## 2018-05-01 NOTE — ED Notes (Signed)
ED Provider at bedside. 

## 2018-05-01 NOTE — ED Notes (Signed)
Pt cleaned up twice as she continues to have soft liquid brown stool.  meds given for fever.  Iv abx infusing.  Foley cath in place.  Pt talkin to family bipap in place.

## 2018-05-01 NOTE — ED Notes (Signed)
MD at bedside. 

## 2018-05-01 NOTE — ED Triage Notes (Signed)
Pt to ED from home reporting increased WOB. EMS reporting wheezing in all filed's. Pt was given 1 duoneb, 125 solumedrol and 4 of zofran in route to the hospital. Pt continues to have noted WOB and SOB. Pt unable to speak in complete sentences.

## 2018-05-01 NOTE — ED Notes (Signed)
Resumed care from christina rn.  Pt with urine and stool incontinence.  No temp reported.  Wide tach on monitor. bipap in place.  Family with pt.   Pt alert.  Skin hot to touch.

## 2018-05-01 NOTE — ED Notes (Addendum)
Entered room and Respiratory Beverlee Nims, RN Deforest Hoyles, RN Larene Beach, EDT Laurence Slate at bedside. Pt on BIPAP, pt restless and labored breathing noted. Began charting what information was being relayed to me.

## 2018-05-01 NOTE — Progress Notes (Signed)
[facility] Progress Note Patient Name: Ashley Bird DOB: 1935-11-19 MRN: [id number]   Date of Service  05/01/2018  HPI/Events of Note  COPD and heart failure presented with shortness of breath and fever. Now requiring BiPap.  eICU Interventions  Continue BiPap. Antibiotics and diuresis.     Intervention Category Major Interventions: Hypercarbia - evaluation and management;Respiratory failure - evaluation and management Evaluation Type: New Patient Evaluation  Judd Lien 05/01/2018, 11:53 PM

## 2018-05-02 ENCOUNTER — Inpatient Hospital Stay: Payer: Medicare Other

## 2018-05-02 ENCOUNTER — Inpatient Hospital Stay
Admit: 2018-05-02 | Discharge: 2018-05-02 | Disposition: A | Discharge status: Home / Self Care | Payer: Medicare Other | Attending: Internal Medicine | Admitting: Internal Medicine

## 2018-05-02 DIAGNOSIS — J9601 Acute respiratory failure with hypoxia: Secondary | ICD-10-CM

## 2018-05-02 DIAGNOSIS — J9602 Acute respiratory failure with hypercapnia: Secondary | ICD-10-CM

## 2018-05-02 LAB — BASIC METABOLIC PANEL
Anion gap: 8 (ref 5–15)
Anion gap: 10 (ref 5–15)
BUN: 27 mg/dL — ABNORMAL HIGH (ref 8–23)
BUN: 33 mg/dL — ABNORMAL HIGH (ref 8–23)
CALCIUM: 8.5 mg/dL — AB (ref 8.9–10.3)
CALCIUM: 8.7 mg/dL — AB (ref 8.9–10.3)
CHLORIDE: 98 mmol/L (ref 98–111)
CO2: 26 mmol/L (ref 22–32)
CO2: 25 mmol/L (ref 22–32)
CREATININE: 2.02 mg/dL — AB (ref 0.44–1.00)
CREATININE: 2.02 mg/dL — AB (ref 0.44–1.00)
Chloride: 100 mmol/L (ref 98–111)
GFR calc Af Amer: 26 mL/min — ABNORMAL LOW (ref >60)
GFR calc non Af Amer: 22 mL/min — ABNORMAL LOW (ref >60)
GFR, EST AFRICAN AMERICAN: 26 mL/min — AB (ref >60)
GFR, EST NON AFRICAN AMERICAN: 22 mL/min — AB (ref >60)
Glucose, Bld: 213 mg/dL — ABNORMAL HIGH (ref 70–99)
Glucose, Bld: 158 mg/dL — ABNORMAL HIGH (ref 70–99)
Potassium: 4 mmol/L (ref 3.5–5.1)
Potassium: 4.1 mmol/L (ref 3.5–5.1)
SODIUM: 134 mmol/L — AB (ref 135–145)
SODIUM: 133 mmol/L — AB (ref 135–145)

## 2018-05-02 LAB — INFLUENZA PANEL BY PCR (TYPE A & B)
INFLAPCR: NEGATIVE
Influenza B By PCR: NEGATIVE

## 2018-05-02 LAB — BLOOD GAS, ARTERIAL
ACID-BASE EXCESS: 1.5 mmol/L (ref 0.0–2.0)
Bicarbonate: 26.6 mmol/L (ref 20.0–28.0)
FIO2: 0.28
O2 SAT: 94.9 %
PH ART: 7.4 (ref 7.350–7.450)
Patient temperature: 37
pCO2 arterial: 43 mmHg (ref 32.0–48.0)
pO2, Arterial: 75 mmHg — ABNORMAL LOW (ref 83.0–108.0)

## 2018-05-02 LAB — CBC
HCT: 37.9 % (ref 36.0–46.0)
Hemoglobin: 11.7 g/dL — ABNORMAL LOW (ref 12.0–15.0)
MCH: 27 pg (ref 26.0–34.0)
MCHC: 30.9 g/dL (ref 30.0–36.0)
MCV: 87.3 fL (ref 80.0–100.0)
PLATELETS: 161 10*3/uL (ref 150–400)
RBC: 4.34 MIL/uL (ref 3.87–5.11)
RDW: 15.1 % (ref 11.5–15.5)
WBC: 16.6 10*3/uL — ABNORMAL HIGH (ref 4.0–10.5)
nRBC: 0 % (ref 0.0–0.2)

## 2018-05-02 LAB — ECHOCARDIOGRAM COMPLETE
Height: 63 in
WEIGHTICAEL: 3068.8 [oz_av]

## 2018-05-02 LAB — GLUCOSE, CAPILLARY
GLUCOSE-CAPILLARY: 300 mg/dL — AB (ref 70–99)
GLUCOSE-CAPILLARY: 179 mg/dL — AB (ref 70–99)
GLUCOSE-CAPILLARY: 136 mg/dL — AB (ref 70–99)
Glucose-Capillary: 205 mg/dL — ABNORMAL HIGH (ref 70–99)
Glucose-Capillary: 194 mg/dL — ABNORMAL HIGH (ref 70–99)
Glucose-Capillary: 223 mg/dL — ABNORMAL HIGH (ref 70–99)

## 2018-05-02 LAB — BLOOD GAS, VENOUS
Acid-base deficit: 3.4 mmol/L — ABNORMAL HIGH (ref 0.0–2.0)
BICARBONATE: 27.2 mmol/L (ref 20.0–28.0)
FIO2: 1
PCO2 VEN: 78 mmHg — AB (ref 44.0–60.0)
Patient temperature: 37
pH, Ven: 7.15 — CL (ref 7.250–7.430)

## 2018-05-02 LAB — BRAIN NATRIURETIC PEPTIDE: B Natriuretic Peptide: 991 pg/mL — ABNORMAL HIGH (ref 0.0–100.0)

## 2018-05-02 LAB — PROTIME-INR
INR: 1.87
PROTHROMBIN TIME: 21.3 s — AB (ref 11.4–15.2)

## 2018-05-02 LAB — LACTIC ACID, PLASMA: Lactic Acid, Venous: 1.8 mmol/L (ref 0.5–1.9)

## 2018-05-02 LAB — PROCALCITONIN
Procalcitonin: 18.62 ng/mL
Procalcitonin: 32.06 ng/mL

## 2018-05-02 LAB — TROPONIN I
TROPONIN I: 0.23 ng/mL — AB (ref <0.03)
TROPONIN I: 0.16 ng/mL — AB (ref <0.03)
TROPONIN I: 0.1 ng/mL — AB (ref <0.03)

## 2018-05-02 LAB — MRSA PCR SCREENING: MRSA BY PCR: NEGATIVE

## 2018-05-02 LAB — PHOSPHORUS: Phosphorus: 2.9 mg/dL (ref 2.5–4.6)

## 2018-05-02 LAB — MAGNESIUM: Magnesium: 1.7 mg/dL (ref 1.7–2.4)

## 2018-05-02 MED ORDER — WARFARIN SODIUM 2 MG PO TABS: 2.0000 mg | ORAL_TABLET | Freq: Every day | ORAL | Status: DC

## 2018-05-02 MED ORDER — SODIUM CHLORIDE 0.9 % IV SOLN
1.0000 g | INTRAVENOUS | Status: DC
  Administered 2018-05-02 – 2018-05-03 (×2): 1 g via INTRAVENOUS
  Filled 2018-05-02 (×3): qty 1

## 2018-05-02 MED ORDER — WARFARIN SODIUM 2 MG PO TABS
2.0000 mg | ORAL_TABLET | Freq: Once | ORAL | Status: DC
  Filled 2018-05-02: qty 1

## 2018-05-02 MED ORDER — WARFARIN - PHARMACIST DOSING INPATIENT
Freq: Every day | Status: DC
  Administered 2018-05-02 – 2018-05-03 (×2)

## 2018-05-02 MED ORDER — FLUTICASONE FUROATE-VILANTEROL 100-25 MCG/INH IN AEPB
1.0000 | INHALATION_SPRAY | Freq: Every day | RESPIRATORY_TRACT | Status: DC
  Filled 2018-05-02: qty 28

## 2018-05-02 MED ORDER — FLUTICASONE-SALMETEROL 100-50 MCG/DOSE IN AEPB
1.0000 | INHALATION_SPRAY | Freq: Two times a day (BID) | RESPIRATORY_TRACT | Status: DC
  Administered 2018-05-02 – 2018-05-04 (×4): 1 via RESPIRATORY_TRACT

## 2018-05-02 MED ORDER — PREDNISONE 20 MG PO TABS
40.0000 mg | ORAL_TABLET | Freq: Every day | ORAL | Status: DC
  Administered 2018-05-03 – 2018-05-04 (×2): 40 mg via ORAL
  Filled 2018-05-02 (×2): qty 2

## 2018-05-02 MED ORDER — ASPIRIN EC 81 MG PO TBEC
81.0000 mg | DELAYED_RELEASE_TABLET | Freq: Every day | ORAL | Status: DC
  Administered 2018-05-02 – 2018-05-04 (×3): 81 mg via ORAL
  Filled 2018-05-02 (×3): qty 1

## 2018-05-02 MED ORDER — TRAMADOL HCL 50 MG PO TABS
50.0000 mg | ORAL_TABLET | Freq: Every day | ORAL | Status: DC
  Administered 2018-05-02 – 2018-05-03 (×2): 50 mg via ORAL
  Filled 2018-05-02 (×2): qty 1

## 2018-05-02 MED ORDER — AMIODARONE HCL 200 MG PO TABS
200.0000 mg | ORAL_TABLET | Freq: Every day | ORAL | Status: DC
  Administered 2018-05-02 – 2018-05-04 (×3): 200 mg via ORAL
  Filled 2018-05-02 (×3): qty 1

## 2018-05-02 MED ORDER — WARFARIN SODIUM 3 MG PO TABS
3.0000 mg | ORAL_TABLET | Freq: Once | ORAL | Status: AC
  Administered 2018-05-02: 3 mg via ORAL
  Filled 2018-05-02 (×2): qty 1

## 2018-05-02 MED ORDER — SODIUM CHLORIDE 0.9 % IV SOLN: INTRAVENOUS | Status: DC | PRN

## 2018-05-02 NOTE — Progress Notes (Signed)
Transported pt to ICU 14 on Bipap without incident. Pt remains on Bipap and is tol well at this time. Report given to the ICU RT.

## 2018-05-02 NOTE — Progress Notes (Signed)
*  PRELIMINARY RESULTS* Echocardiogram 2D Echocardiogram has been performed.  Ashley Bird Ashley Bird 05/02/2018, 12:26 PM

## 2018-05-02 NOTE — Progress Notes (Signed)
ANTICOAGULATION CONSULT NOTE - Initial Consult  Pharmacy Consult for warfarin Indication: atrial fibrillation  Not on File  Patient Measurements: Height: 5\' 3"  (160 cm) Weight: 191 lb 12.8 oz (87 kg) IBW/kg (Calculated) : 52.4  Vital Signs: Temp: 100 F (37.8 C) (10/21 2320) Temp Source: Axillary (10/21 2320) BP: 90/71 (10/22 0100) Pulse Rate: 86 (10/22 0100)  Labs: Recent Labs    05/01/18 1837 05/01/18 2328  HGB 12.4  --   HCT 41.7  --   PLT 247  --   LABPROT  --  22.4*  INR  --  2.00  CREATININE 1.63*  --   TROPONINI 0.03* 0.23*    Estimated Creatinine Clearance: 28.8 mL/min (A) (by C-G formula based on SCr of 1.63 mg/dL (H)).   Medical History: Past Medical History:  Diagnosis Date  . CAD (coronary artery disease)   . COPD (chronic obstructive pulmonary disease) ([place])   . Diabetes ([place])   . HLD (hyperlipidemia)   . HTN (hypertension)   . PAF (paroxysmal atrial fibrillation) ([facility])     Medications:  Scheduled:  . Influenza vac split quadrivalent PF  0.5 mL Intramuscular Tomorrow-1000  . insulin aspart  0-9 Units Subcutaneous Q4H  . methylPREDNISolone (SOLU-MEDROL) injection  60 mg Intravenous Q6H  . mometasone-formoterol  2 puff Inhalation BID  . nystatin cream   Topical BID  . warfarin  2 mg Oral q1800    Assessment: Patient admitted w/ SOB w/ h/o afib and is anticoagulated w/ warfarin PTA: Warfarin 2 mg Sun, Tues, Wed, Thurs, Sat Warfarin 3 mg Monday  10/21 INR 2.00  Goal of Therapy:  INR 2-3 Monitor platelets by anticoagulation protocol: Yes   Plan:  Will restart warfarin 2 mg daily for now. Will monitor daily INR's and adjust based on trend. Will monitor daily CBC's and s/sx of bleeding. Antibiotic increase risk of spikes in INR -- will monitor closely for any abrupt changes in INR trend.  Tobie Lords, PharmD, BCPS Clinical Pharmacist 05/02/2018

## 2018-05-02 NOTE — Progress Notes (Addendum)
ANTICOAGULATION CONSULT NOTE - Initial Consult  Pharmacy Consult for warfarin Indication: atrial fibrillation  Not on File  Patient Measurements: Height: 5\' 3"  (160 cm) Weight: 191 lb 12.8 oz (87 kg) IBW/kg (Calculated) : 52.4  Vital Signs: Temp: 97.9 F (36.6 C) (10/22 0345) Temp Source: Axillary (10/22 0345) BP: 112/57 (10/22 0400) Pulse Rate: 69 (10/22 0400)  Labs: Recent Labs    05/01/18 1837 05/01/18 2328 05/02/18 0445  HGB 12.4  --  11.7*  HCT 41.7  --  37.9  PLT 247  --  161  LABPROT  --  22.4* 21.3*  INR  --  2.00 1.87  CREATININE 1.63*  --  2.02*  TROPONINI 0.03* 0.23* 0.16*    Estimated Creatinine Clearance: 23.2 mL/min (A) (by C-G formula based on SCr of 2.02 mg/dL (H)).   Medical History: Past Medical History:  Diagnosis Date  . CAD (coronary artery disease)   . COPD (chronic obstructive pulmonary disease) ([place])   . Diabetes ([place])   . HLD (hyperlipidemia)   . HTN (hypertension)   . PAF (paroxysmal atrial fibrillation) ([facility])     Medications:  Scheduled:  . Influenza vac split quadrivalent PF  0.5 mL Intramuscular Tomorrow-1000  . insulin aspart  0-9 Units Subcutaneous Q4H  . methylPREDNISolone (SOLU-MEDROL) injection  60 mg Intravenous Q6H  . mometasone-formoterol  2 puff Inhalation BID  . nystatin cream   Topical BID  . warfarin  2 mg Oral q1800  . warfarin  2 mg Oral Once    Assessment: Patient admitted w/ SOB w/ h/o afib and is anticoagulated w/ warfarin PTA: Warfarin 2 mg Sun, Tues, Wed, Thurs, Sat Warfarin 3 mg Monday  10/21 INR 2.00 10/22 INR 1.87  Goal of Therapy:  INR 2-3 Monitor platelets by anticoagulation protocol: Yes   Plan:  INR this am subtherapeutic at 1.87. Will administer warfarin 2 mg PO once and will recheck INR w/ am labs. Will hold off on daily dosing for now as patient is also on amiodarone + abx which can increase risk of INR spikes.  Tobie Lords, PharmD, BCPS Clinical Pharmacist 05/02/2018

## 2018-05-02 NOTE — Consult Note (Addendum)
Pharmacy Antibiotic Note  Ashley Bird is a 82 y.o. female admitted on 05/01/2018 with COPD exacerbation. Pharmacy has been consulted for cefepime dosing. Patient felt ill ~3 days PTA. Patient currently on BiPAP. Patient currently AKI on CKD.  Plan: Vancomycin discontinued per CCM rounds discussion  Continue Cefepime 1 gm every 24 hours.  Height: 5\' 3"  (160 cm) Weight: 191 lb 12.8 oz (87 kg) IBW/kg (Calculated) : 52.4  Temp (24hrs), Avg:99.8 F (37.7 C), Min:97.9 F (36.6 C), Max:102.7 F (39.3 C)  Recent Labs  Lab 05/01/18 1834 05/01/18 1837 05/01/18 2328 05/02/18 0445  WBC  --  15.8*  --  16.6*  CREATININE  --  1.63*  --  2.02*  LATICACIDVEN 3.0*  --  1.8  --     Estimated Creatinine Clearance: 23.2 mL/min (A) (by C-G formula based on SCr of 2.02 mg/dL (H)).    Not on File  Antimicrobials this admission:\ Cefepime 10/21 >>  Vancomycin 10/21 >> 10/22  Dose adjustments this admission: 10/22 Cefepime transitioned to 1g IV Q24hr.   Microbiology results: 10/21 BCx: NG < 1 day 10/21 UCx:  pending 10/21 MRSA PCR: (-)  Thank you for allowing pharmacy to be a part of this patient's care.   Paticia Stack, PharmD Pharmacy Resident  05/02/2018 11:45 AM

## 2018-05-02 NOTE — Progress Notes (Signed)
Inpatient Diabetes Program Recommendations  AACE/ADA: New Consensus Statement on Inpatient Glycemic Control (2015)  Target Ranges:  Prepandial:   less than 140 mg/dL      Peak postprandial:   less than 180 mg/dL (1-2 hours)      Critically ill patients:  140 - 180 mg/dL   Results for CARLEENA, MIRES (MRN [id number]) as of 05/02/2018 12:09  Ref. Range 05/01/2018 23:11 05/02/2018 03:43 05/02/2018 07:31 05/02/2018 08:53  Glucose-Capillary Latest Ref Range: 70 - 99 mg/dL 225 (H)  3 units NOVOLOG  205 (H)  3 units NOVOLOG  194 (H) 300 (H)  5 units NOVOLOG     Admit: SOB  History: DM, COPD  Home DM Meds: None listed  Current Orders: Novolog Sensitive Correction Scale/ SSI (0-9 units) Q4 hours (started at [facility]).     Pt got 125 mg Solumedrol X 1 dose at 9pm last night and then started on Solumedrol 60 mg Q6H.    MD- Please consider the following in-hospital insulin adjustments:  1. Change Novolog SSI timing to TID AC + HS (currently ordered Q4 hours and patient eating solid PO diet)  2. Start Novolog Meal Coverage while patient getting steroids: Novolog 3 units TID with meals   (Please add the following Hold Parameters: Hold if pt eats <50% of meal, Hold if pt NPO)     --Will follow patient during hospitalization--  Wyn Quaker RN, MSN, CDE Diabetes Coordinator Inpatient Glycemic Control Team Team Pager: [phone] (8a-5p)

## 2018-05-02 NOTE — Progress Notes (Signed)
Report given to Quillian Quince. Patient alert, no complaints of shortness of breath. She is on 2 liters. Tolerating carb modified diet. Foley removed. Patient being transferred to floor with tele.

## 2018-05-02 NOTE — Progress Notes (Signed)
Patient ID: Ashley Bird, female   DOB: 1935-11-14, 82 y.o.   MRN: [id number]  [facility] PROGRESS NOTE  Ashley Bird TKW:[id number] DOB: December 17, 1935 DOA: 05/01/2018 PCP: Teodoro Spray, MD  HPI/Subjective: Patient came in with shortness of breath and difficulty breathing.  She is breathing better today.  She is also sick to her stomach and very tired and had some diarrhea and was short of breath.  She did have some cough and fever.  Normally she walks with a cane or walker.  She has been off oxygen for the past year.  Objective: Vitals:   05/02/18 1200 05/02/18 1300  BP: (!) 99/48 102/67  Pulse: 75 81  Resp: 20 (!) 22  Temp: 97.8 F (36.6 C)   SpO2: 96% 93%    Intake/Output Summary (Last 24 hours) at 05/02/2018 1431 Last data filed at 05/02/2018 1402 Gross per 24 hour  Intake 2930.07 ml  Output 1650 ml  Net 1280.07 ml   Filed Weights   05/01/18 1827 05/01/18 1856 05/01/18 2320  Weight: 88.9 kg 88.9 kg 87 kg    ROS: Review of Systems  Constitutional: Negative for chills and fever.  Eyes: Negative for blurred vision.  Respiratory: Positive for cough and shortness of breath.   Cardiovascular: Negative for chest pain.  Gastrointestinal: Positive for diarrhea and nausea. Negative for abdominal pain, constipation and vomiting.  Genitourinary: Negative for dysuria.  Musculoskeletal: Negative for joint pain.  Neurological: Negative for dizziness and headaches.   Exam: Physical Exam  Constitutional: She is oriented to person, place, and time.  HENT:  Nose: No mucosal edema.  Mouth/Throat: No oropharyngeal exudate or posterior oropharyngeal edema.  Eyes: Pupils are equal, round, and reactive to light. Conjunctivae, EOM and lids are normal.  Neck: No JVD present. Carotid bruit is not present. No edema present. No thyroid mass and no thyromegaly present.  Cardiovascular: S1 normal and S2 normal. Exam reveals no gallop.  No murmur heard. Pulses:      Dorsalis pedis  pulses are 2+ on the right side, and 2+ on the left side.  Respiratory: No respiratory distress. She has decreased breath sounds in the right lower field and the left lower field. She has no wheezes. She has rhonchi in the right lower field and the left lower field. She has no rales.  GI: Soft. Bowel sounds are normal. There is no tenderness.  Musculoskeletal:       Right ankle: She exhibits no swelling.       Left ankle: She exhibits no swelling.  Lymphadenopathy:    She has no cervical adenopathy.  Neurological: She is alert and oriented to person, place, and time. No cranial nerve deficit.  Skin: Skin is warm. No rash noted. Nails show no clubbing.  Psychiatric: She has a normal mood and affect.      Data Reviewed: Basic Metabolic Panel: Recent Labs  Lab 05/01/18 1837 05/02/18 0445 05/02/18 1351  NA 134* 134* 133*  K 4.4 4.0 4.1  CL 98 100 98  CO2 23 26 25   GLUCOSE 255* 213* 158*  BUN 20 27* 33*  CREATININE 1.63* 2.02* 2.02*  CALCIUM 8.9 8.5* 8.7*  MG  --   --  1.7  PHOS  --   --  2.9   Liver Function Tests: Recent Labs  Lab 05/01/18 1837  AST 76*  ALT 50*  ALKPHOS 79  BILITOT 0.6  PROT 6.7  ALBUMIN 3.3*   No results for input(s): LIPASE, AMYLASE in  the last 168 hours. No results for input(s): AMMONIA in the last 168 hours. CBC: Recent Labs  Lab 05/01/18 1837 05/02/18 0445  WBC 15.8* 16.6*  HGB 12.4 11.7*  HCT 41.7 37.9  MCV 90.5 87.3  PLT 247 161   Cardiac Enzymes: Recent Labs  Lab 05/01/18 1837 05/01/18 2328 05/02/18 0445 05/02/18 1052  TROPONINI 0.03* 0.23* 0.16* 0.10*   BNP (last 3 results) Recent Labs    05/01/18 2328  BNP 991.0*    ProBNP (last 3 results) No results for input(s): PROBNP in the last 8760 hours.  CBG: Recent Labs  Lab 05/01/18 2311 05/02/18 0343 05/02/18 0731 05/02/18 0853 05/02/18 1134  GLUCAP 225* 205* 194* 300* 179*    Recent Results (from the past 240 hour(s))  Blood Culture (routine x 2)     Status:  None (Preliminary result)   Collection Time: 05/01/18  6:39 PM  Result Value Ref Range Status   Specimen Description BLOOD LEFT ANTECUBITAL  Final   Special Requests   Final    BOTTLES DRAWN AEROBIC AND ANAEROBIC Blood Culture adequate volume   Culture   Final    NO GROWTH < 12 HOURS Performed at [facility], [address], [place], [place] [address]    Report Status PENDING  Incomplete  Blood Culture (routine x 2)     Status: None (Preliminary result)   Collection Time: 05/01/18  6:39 PM  Result Value Ref Range Status   Specimen Description BLOOD BLOOD RIGHT HAND  Final   Special Requests   Final    BOTTLES DRAWN AEROBIC AND ANAEROBIC Blood Culture adequate volume   Culture   Final    NO GROWTH < 12 HOURS Performed at [facility], [address] [place], [place] [address]    Report Status PENDING  Incomplete  MRSA PCR Screening     Status: None   Collection Time: 05/01/18 11:12 PM  Result Value Ref Range Status   MRSA by PCR NEGATIVE NEGATIVE Final    Comment:        The GeneXpert MRSA Assay (FDA approved for NASAL specimens only), is one component of a comprehensive MRSA colonization surveillance program. It is not intended to diagnose MRSA infection nor to guide or monitor treatment for MRSA infections. Performed at [facility], [address] [place], [place] [address]      Studies: Dg Chest 1 View  Result Date: 05/01/2018 CLINICAL DATA:  Shortness of breath, chest pain EXAM: CHEST  1 VIEW COMPARISON:  07/12/2007 FINDINGS: Cardiomegaly with vascular congestion and interstitial prominence concerning for interstitial edema. No visible significant effusions or acute bony abnormality. IMPRESSION: Cardiomegaly, vascular congestion, probable mild interstitial edema. Electronically Signed   By: Rolm Baptise M.D.   On: 05/01/2018 19:12   US Renal  Result Date: 05/02/2018 CLINICAL DATA:  82 year old female with acute kidney insufficiency.  Initial encounter. EXAM: RENAL / URINARY TRACT ULTRASOUND COMPLETE COMPARISON:  None. FINDINGS: Right Kidney: Length: 9 cm. Echogenicity within normal limits. No mass or hydronephrosis visualized. Left Kidney: Length: 8.5 cm. Increased echogenicity. No hydronephrosis. Upper pole 1.5 cm cyst. Bladder: Decompressed by Foley catheter. IMPRESSION: 1. No hydronephrosis. 2. Increased left renal parenchyma echogenicity. 3. Left renal 1.5 cm cyst. 4. Bladder decompressed by Foley catheter. Electronically Signed   By: Genia Del M.D.   On: 05/02/2018 13:59    Scheduled Meds: . amiodarone  200 mg Oral Daily  . aspirin EC  81 mg Oral Daily  . Fluticasone-Salmeterol  1 puff Inhalation BID  . insulin aspart  0-9 Units Subcutaneous Q4H  . nystatin cream   Topical BID  . [START ON 05/03/2018] predniSONE  40 mg Oral Q breakfast  . traMADol  50 mg Oral Daily  . warfarin  2 mg Oral Once   Continuous Infusions: . sodium chloride    . ceFEPime (MAXIPIME) IV      Assessment/Plan:  1. Acute on chronic hypoxic hypercapnic respiratory failure.  Respiratory acidosis.  Initially required BiPAP and ICU stepdown monitoring.  Now on nasal cannula.  She has been off oxygen for the last few years. 2. COPD exacerbation started on prednisone.  Nebulizer treatments and Advair. 3. Acute on chronic diastolic congestive heart failure with moderate aortic stenosis, left bundle branch block, elevated troponin.  Patient on aspirin and amiodarone.  Cardiology consultation pending.  Patient received 1 dose of diuretic in the ED. 4. Acute on chronic kidney disease stage III.  Continue to monitor closely. 5. Paroxysmal atrial fibrillation on amiodarone and warfarin 6. Type 2 diabetes mellitus on sliding scale.  Diabetes coordinator recommends NovoLog 3 times 3 times daily with meals  Code Status:     Code Status Orders  (From admission, onward)         Start     Ordered   05/01/18 2254  Full code  Continuous      05/01/18 2253        Code Status History    This patient has a current code status but no historical code status.     Family Communication: Daughter at the bedside Disposition Plan: To be determined  Consultants:  Critical care specialist.  Antibiotics:  Cefepime  Time spent: 28 minutes  [place]

## 2018-05-02 NOTE — Progress Notes (Addendum)
ANTICOAGULATION CONSULT NOTE - Initial Consult  Pharmacy Consult for warfarin Indication: atrial fibrillation  Not on File  Patient Measurements: Height: 5\' 3"  (160 cm) Weight: 191 lb 12.8 oz (87 kg) IBW/kg (Calculated) : 52.4  Vital Signs: Temp: 98.3 F (36.8 C) (10/22 0800) Temp Source: Oral (10/22 0800) BP: 99/48 (10/22 1200) Pulse Rate: 75 (10/22 1200)  Labs: Recent Labs    05/01/18 1837 05/01/18 2328 05/02/18 0445 05/02/18 1052  HGB 12.4  --  11.7*  --   HCT 41.7  --  37.9  --   PLT 247  --  161  --   LABPROT  --  22.4* 21.3*  --   INR  --  2.00 1.87  --   CREATININE 1.63*  --  2.02*  --   TROPONINI 0.03* 0.23* 0.16* 0.10*    Estimated Creatinine Clearance: 23.2 mL/min (A) (by C-G formula based on SCr of 2.02 mg/dL (H)).   Medical History: Past Medical History:  Diagnosis Date  . CAD (coronary artery disease)   . COPD (chronic obstructive pulmonary disease) ([place])   . Diabetes ([place])   . HLD (hyperlipidemia)   . HTN (hypertension)   . PAF (paroxysmal atrial fibrillation) ([facility])     Medications:  Scheduled:  . amiodarone  200 mg Oral Daily  . aspirin EC  81 mg Oral Daily  . Fluticasone-Salmeterol  1 puff Inhalation BID  . insulin aspart  0-9 Units Subcutaneous Q4H  . nystatin cream   Topical BID  . [START ON 05/03/2018] predniSONE  40 mg Oral Q breakfast  . traMADol  50 mg Oral Daily  . warfarin  2 mg Oral Once    Assessment: Patient admitted w/ SOB w/ h/o afib and is anticoagulated w/ warfarin PTA: Spoke with patient - she now takes Warfarin 2 mg daily (no longer on the 2 mg daily, except for 2 mg on Monday).  10/21 INR 2.00 10/22 INR 1.87  CHADSVASC score = 6  Goal of Therapy:  INR 2-3 Monitor platelets by anticoagulation protocol: Yes   Plan:  INR this am subtherapeutic at 1.87.  Per patient, she missed her dose on 10/21.  Will order warfarin 3 mg PO once and will recheck INR w/ am labs. Will order Warfarin 2 mg starting tomorrow,  10/23.   Drug Interactions Amiodarone (home medication) + cefepime can increase the effects of warfarin. Will continue to monitor.  Paticia Stack, PharmD Pharmacy Resident  05/02/2018 1:54 PM

## 2018-05-02 NOTE — Progress Notes (Addendum)
   Name: Ashley Bird MRN: [id number] DOB: 08/18/1935     CONSULTATION DATE: 05/01/2018  Subjective & Objectives: Off BiPAP, on [place], no chest pain and tolerating a diet.  PAST MEDICAL HISTORY :   has a past medical history of CAD (coronary artery disease), COPD (chronic obstructive pulmonary disease) ([place]), Diabetes ([place]), HLD (hyperlipidemia), HTN (hypertension), and PAF (paroxysmal atrial fibrillation) ([place]).  has a past surgical history that includes Breast excisional biopsy (Left, 40 yrs ago); Breast excisional biopsy (Left, 40 yrs ago); and Abdominal hysterectomy. Prior to Admission medications   Medication Sig Start Date End Date Taking? Authorizing Provider  ADVAIR DISKUS 100-50 MCG/DOSE AEPB Inhale 1 puff into the lungs every 12 (twelve) hours. 02/23/18   [provider]  amiodarone (PACERONE) 200 MG tablet Take 200 mg by mouth daily. 03/03/18   [provider]  metoprolol succinate (TOPROL-XL) 50 MG 24 hr tablet Take 50 mg by mouth daily. 05/01/18   [provider]  olmesartan-hydrochlorothiazide (BENICAR HCT) 40-25 MG tablet Take 1 tablet by mouth daily. 01/26/18   [provider]  warfarin (COUMADIN) 2 MG tablet Take 2 mg by mouth as directed. Take on Sunday, Tuesday, Wednesday, Thursday, Friday, Saturday 04/17/18   [provider]  warfarin (COUMADIN) 3 MG tablet Take 3 mg by mouth once a week. On Monday 03/02/18   [provider]   Not on File  FAMILY HISTORY:  family history includes Asthma in her father; Colon cancer in her mother; Diabetes in her mother. SOCIAL HISTORY:  reports that she has quit smoking. She has never used smokeless tobacco. She reports that she drank alcohol.  REVIEW OF SYSTEMS:   Unable to obtain due to critical illness   VITAL SIGNS: Temp:  [97.9 F (36.6 C)-102.7 F (39.3 C)] 98.3 F (36.8 C) (10/22 0800) Pulse Rate:  [67-135] 71 (10/22 0900) Resp:  [13-36] 16 (10/22 0900) BP:  (80-136)/(56-114) 117/83 (10/22 0800) SpO2:  [91 %-100 %] 97 % (10/22 0900) FiO2 (%):  [30 %-50 %] 30 % (10/22 0345) Weight:  [87 kg-88.9 kg] 87 kg (10/21 2320)  Physical Examination:  A & O x 3 and no focal acute neuro deficits On [place], no distress, able to talk in full sentences. BEAE and no rales S1 & S2 audible and no murmur Benign abdomen with normal peristalses No edema   ASSESSMENT / PLAN:  Acute on chronic respiratory failure( has been off home O2 for the last 3 y as per patient). Improved, off BiPAP and tolerating [place] -Monitor work of breathing and ABG  Acute exacerbation of COPD -Optimize bronchodilators, ABX and tapering steroids.  Atelectasis and pneumonia. Lt. L airspace disease -c/w Cefep + d/c Vac. -Monitor CXR + CBC + FIO2   CHF, P A Fib, CAD and mild elevation of Trop with possible demand vs supply mismatch / ACS with questionable LBB and VT. On Amio + Metoprol (on hold because of BP parametrs)+ Warfarin -Start on ASA  -Optimize diuresis -Monitor electrolytes, ECHO and follow with cardiology consult  AKI -Optimize volume, avoid nephrotoxins, monitor renal panel and urine out put -Follow with renal U/S  Sepsis -Cefep, MRSA CR -ve -Monitor cultures  DM -Glycemic control  HTN -Optimize antihypertensives and monitor hemodynamics.  Anemia -Keep HB > 7 gm/dl  Coagulopathy with Warfarin for A fib -Optimize anticoagulation and monitor coags.  Full code  Supportive care  Critical care time 40 min

## 2018-05-03 ENCOUNTER — Inpatient Hospital Stay: Payer: Medicare Other

## 2018-05-03 DIAGNOSIS — Z515 Encounter for palliative care: Secondary | ICD-10-CM

## 2018-05-03 DIAGNOSIS — J441 Chronic obstructive pulmonary disease with (acute) exacerbation: Secondary | ICD-10-CM

## 2018-05-03 DIAGNOSIS — R0603 Acute respiratory distress: Secondary | ICD-10-CM

## 2018-05-03 DIAGNOSIS — I5021 Acute systolic (congestive) heart failure: Secondary | ICD-10-CM

## 2018-05-03 DIAGNOSIS — Z7189 Other specified counseling: Secondary | ICD-10-CM

## 2018-05-03 LAB — CBC WITH DIFFERENTIAL/PLATELET
ABS IMMATURE GRANULOCYTES: 0.15 10*3/uL — AB (ref 0.00–0.07)
BASOS ABS: 0 10*3/uL (ref 0.0–0.1)
Basophils Relative: 0 %
Eosinophils Absolute: 0 10*3/uL (ref 0.0–0.5)
Eosinophils Relative: 0 %
HCT: 33.9 % — ABNORMAL LOW (ref 36.0–46.0)
Hemoglobin: 10.4 g/dL — ABNORMAL LOW (ref 12.0–15.0)
IMMATURE GRANULOCYTES: 1 %
LYMPHS ABS: 0.9 10*3/uL (ref 0.7–4.0)
Lymphocytes Relative: 5 %
MCH: 26.7 pg (ref 26.0–34.0)
MCHC: 30.7 g/dL (ref 30.0–36.0)
MCV: 86.9 fL (ref 80.0–100.0)
MONOS PCT: 4 %
Monocytes Absolute: 0.8 10*3/uL (ref 0.1–1.0)
NEUTROS ABS: 17 10*3/uL — AB (ref 1.7–7.7)
NEUTROS PCT: 90 %
NRBC: 0 % (ref 0.0–0.2)
PLATELETS: 167 10*3/uL (ref 150–400)
RBC: 3.9 MIL/uL (ref 3.87–5.11)
RDW: 15.2 % (ref 11.5–15.5)
Smear Review: NORMAL
WBC: 18.9 10*3/uL — ABNORMAL HIGH (ref 4.0–10.5)

## 2018-05-03 LAB — GLUCOSE, CAPILLARY
GLUCOSE-CAPILLARY: 135 mg/dL — AB (ref 70–99)
Glucose-Capillary: 118 mg/dL — ABNORMAL HIGH (ref 70–99)
Glucose-Capillary: 105 mg/dL — ABNORMAL HIGH (ref 70–99)
Glucose-Capillary: 191 mg/dL — ABNORMAL HIGH (ref 70–99)
Glucose-Capillary: 175 mg/dL — ABNORMAL HIGH (ref 70–99)

## 2018-05-03 LAB — BASIC METABOLIC PANEL
Anion gap: 8 (ref 5–15)
BUN: 37 mg/dL — ABNORMAL HIGH (ref 8–23)
CHLORIDE: 97 mmol/L — AB (ref 98–111)
CO2: 26 mmol/L (ref 22–32)
CREATININE: 1.91 mg/dL — AB (ref 0.44–1.00)
Calcium: 8.7 mg/dL — ABNORMAL LOW (ref 8.9–10.3)
GFR calc Af Amer: 27 mL/min — ABNORMAL LOW (ref >60)
GFR calc non Af Amer: 24 mL/min — ABNORMAL LOW (ref >60)
GLUCOSE: 150 mg/dL — AB (ref 70–99)
POTASSIUM: 4.1 mmol/L (ref 3.5–5.1)
Sodium: 131 mmol/L — ABNORMAL LOW (ref 135–145)

## 2018-05-03 LAB — PROTIME-INR
INR: 1.91
Prothrombin Time: 21.6 seconds — ABNORMAL HIGH (ref 11.4–15.2)

## 2018-05-03 LAB — PHOSPHORUS: Phosphorus: 3.7 mg/dL (ref 2.5–4.6)

## 2018-05-03 LAB — HEMOGLOBIN A1C
Hgb A1c MFr Bld: 6.2 % — ABNORMAL HIGH (ref 4.8–5.6)
Mean Plasma Glucose: 131 mg/dL

## 2018-05-03 LAB — URINE CULTURE: Culture: <10000 — AB

## 2018-05-03 LAB — PROCALCITONIN: PROCALCITONIN: 31.49 ng/mL

## 2018-05-03 LAB — CALCIUM, IONIZED: CALCIUM, IONIZED, SERUM: 4.9 mg/dL (ref 4.5–5.6)

## 2018-05-03 LAB — MAGNESIUM: Magnesium: 1.7 mg/dL (ref 1.7–2.4)

## 2018-05-03 MED ORDER — WARFARIN SODIUM 3 MG PO TABS
3.0000 mg | ORAL_TABLET | Freq: Once | ORAL | Status: AC
  Administered 2018-05-03: 3 mg via ORAL
  Filled 2018-05-03: qty 1

## 2018-05-03 MED ORDER — INSULIN ASPART 100 UNIT/ML ~~LOC~~ SOLN: 0.0000 [IU] | Freq: Every day | SUBCUTANEOUS | Status: DC

## 2018-05-03 MED ORDER — INSULIN ASPART 100 UNIT/ML ~~LOC~~ SOLN
2.0000 [IU] | Freq: Three times a day (TID) | SUBCUTANEOUS | Status: DC
  Administered 2018-05-03 – 2018-05-04 (×2): 2 [IU] via SUBCUTANEOUS
  Filled 2018-05-03 (×2): qty 1

## 2018-05-03 MED ORDER — INSULIN ASPART 100 UNIT/ML ~~LOC~~ SOLN
0.0000 [IU] | Freq: Three times a day (TID) | SUBCUTANEOUS | Status: DC
  Administered 2018-05-03: 2 [IU] via SUBCUTANEOUS
  Filled 2018-05-03: qty 1

## 2018-05-03 MED ORDER — FUROSEMIDE 10 MG/ML IJ SOLN
40.0000 mg | Freq: Once | INTRAMUSCULAR | Status: AC
  Administered 2018-05-03: 10:00:00 40 mg via INTRAVENOUS
  Filled 2018-05-03 (×2): qty 4

## 2018-05-03 NOTE — Progress Notes (Addendum)
ANTICOAGULATION CONSULT NOTE - Initial Consult  Pharmacy Consult for warfarin Indication: atrial fibrillation  Allergies  Allergen Reactions  . Alendronate   . Doxycycline Swelling    Swelling and hemmorage     . Effexor [Venlafaxine] Other (See Comments)    Body ached   . Ferralet [Iron-Folic [id number]] Nausea And Vomiting    Vomiting   . Ferrous Sulfate Nausea And Vomiting  . Penicillins Swelling    Per pt: swelling and hemmorage     . Prozac [Fluoxetine]   . Simvastatin Other (See Comments)    Muscle pain   . Zoloft [Sertraline Hcl]     Patient Measurements: Height: 5\' 3"  (160 cm) Weight: 191 lb 12.8 oz (87 kg) IBW/kg (Calculated) : 52.4  Vital Signs: BP: 114/73 (10/23 0513) Pulse Rate: 71 (10/23 0513)  Labs: Recent Labs    05/01/18 1837 05/01/18 2328 05/02/18 0445 05/02/18 1052 05/02/18 1351 05/03/18 0444  HGB 12.4  --  11.7*  --   --  10.4*  HCT 41.7  --  37.9  --   --  33.9*  PLT 247  --  161  --   --  167  LABPROT  --  22.4* 21.3*  --   --  21.6*  INR  --  2.00 1.87  --   --  1.91  CREATININE 1.63*  --  2.02*  --  2.02* 1.91*  TROPONINI 0.03* 0.23* 0.16* 0.10*  --   --     Estimated Creatinine Clearance: 24.6 mL/min (A) (by C-G formula based on SCr of 1.91 mg/dL (H)).   Medical History: Past Medical History:  Diagnosis Date  . CAD (coronary artery disease)   . COPD (chronic obstructive pulmonary disease) ([place])   . Diabetes ([place])   . HLD (hyperlipidemia)   . HTN (hypertension)   . PAF (paroxysmal atrial fibrillation) ([facility])     Medications:  Scheduled:  . amiodarone  200 mg Oral Daily  . aspirin EC  81 mg Oral Daily  . Fluticasone-Salmeterol  1 puff Inhalation BID  . insulin aspart  0-9 Units Subcutaneous Q4H  . nystatin cream   Topical BID  . predniSONE  40 mg Oral Q breakfast  . traMADol  50 mg Oral Daily  . warfarin  2 mg Oral Once  . warfarin  2 mg Oral q1800  . Warfarin - Pharmacist Dosing Inpatient   Does not apply  q1800    Assessment: Patient admitted w/ SOB w/ h/o afib and is anticoagulated w/ warfarin PTA: Spoke with patient - she now takes Warfarin 2 mg daily. Per patient, she missed her dose on 10/21.   CHADSVASC score = 6  DATE INR DOSE 10/21 2.00 Missed  10/22 1.87 3mg  10/23 1.91  Goal of Therapy:  INR 2-3 Monitor platelets by anticoagulation protocol: Yes   Plan:  INR  this am still subtherapeutic at 1.91. Will order warfarin 3mg  x 1 dose this evening. Will recheck INR with AM labs and continue to check INR daily while on Abx per protocol.   Drug Interactions Amiodarone (home medication) + cefepime can increase the effects of warfarin. Will continue to monitor.  Pernell Dupre, PharmD, BCPS Clinical Pharmacist 05/03/2018 8:32 AM

## 2018-05-03 NOTE — Progress Notes (Signed)
Inpatient Diabetes Program Recommendations  AACE/ADA: New Consensus Statement on Inpatient Glycemic Control (2015)  Target Ranges:  Prepandial:   less than 140 mg/dL      Peak postprandial:   less than 180 mg/dL (1-2 hours)      Critically ill patients:  140 - 180 mg/dL    Results for Ashley Bird, Ashley Bird (MRN [id number]) as of 05/03/2018 07:02  Ref. Range 05/01/2018 23:11 05/02/2018 03:43 05/02/2018 07:31 05/02/2018 08:53 05/02/2018 11:34 05/02/2018 16:21 05/02/2018 21:11  Glucose-Capillary Latest Ref Range: 70 - 99 mg/dL 225 (H)  3 units NOVOLOG  205 (H)  3 units NOVOLOG  194 (H) 300 (H)  5 units NOVOLOG  179 (H)  2 units NOVOLOG    136 (H)  1 unit NOVOLOG  223 (H)    Admit: SOB  History: DM, COPD  Home DM Meds: None listed  Current Orders: Novolog Sensitive Correction Scale/ SSI (0-9 units) Q4 hours     Note Solumedrol stopped--Last dose given yesterday at 9am.  Now scheduled to receive Prednisone 40 mg Daily.    MD- Please consider the following in-hospital insulin adjustments:  1. Change Novolog SSI timing to TID AC + HS (currently ordered Q4 hours and patient eating solid PO diet)  2. Start Novolog Meal Coverage while patient getting steroids: Novolog 2 units TID with meals   (Please add the following Hold Parameters: Hold if pt eats <50% of meal, Hold if pt NPO)     --Will follow patient during hospitalization--  Wyn Quaker RN, MSN, CDE Diabetes Coordinator Inpatient Glycemic Control Team Team Pager: [phone] (8a-5p)

## 2018-05-03 NOTE — Consult Note (Signed)
[facility]  CARDIOLOGY CONSULT NOTE  Patient ID: Ashley Bird MRN: [id number] DOB/AGE: 1936/03/04 82 y.o.  Admit date: 05/01/2018 Referring Physician Manuella Ghazi Primary Physician [facility] Primary Cardiologist Fath Reason for Consultation acute on chronic diastolic CHF, Atrial fibrillation on warfarin  HPI: 82 year old female referred for evaluation of acute on chronic diastolic CHF and atrial fibrillation. The patient has a history of paroxysmal atrial fibrillation on amiodarone and warfarin with a chads vasc score of 4, moderate aortic stenosis, as well as type 2 diabetes, hypertension, COPD, and hyperlipidemia.  The patient presented [facility] via EMS on 05/01/2018 and acute respiratory distress, requiring BiPAP.  The patient reported that she had not been feeling well for the past 3 days with the development of a cough which progressed to severe shortness of breath.  She used nebulizer treatments at home with no improvement.  She also had episodes of lethargy and was less responsive, thus was taken to the ER.  Initial ECG revealed wide-complex tachycardia with left bundle branch block at a rate of 140 bpm, which improved when the patient's respiratory status improved. She received IV Lasix and gentle hydration.  Most recent admission labs notable for sodium 131, BUN 37, creatinine 1.91, INR 1.91, and borderline elevated troponin of 0.03, 0.23, 0.16, followed by 0.10. Currently, the patient is resting in bed with family at the bedside. She reports feeling much better, denying chest pain or palpitations, and states her breathing is much improved.  Review of systems complete and found to be negative unless listed above     Past Medical History:  Diagnosis Date  . CAD (coronary artery disease)   . COPD (chronic obstructive pulmonary disease) ([place])   . Diabetes ([place])   . HLD (hyperlipidemia)   . HTN (hypertension)   . PAF (paroxysmal atrial fibrillation) ([place])     Past Surgical History:   Procedure Laterality Date  . ABDOMINAL HYSTERECTOMY    . BREAST EXCISIONAL BIOPSY Left 40 yrs ago   neg  . BREAST EXCISIONAL BIOPSY Left 40 yrs ago   neg    Medications Prior to Admission  Medication Sig Dispense Refill Last Dose  . acetaminophen (TYLENOL) 325 MG tablet Take 325 mg by mouth every 6 (six) hours as needed.   Unknown at Unknown  . ADVAIR DISKUS 100-50 MCG/DOSE AEPB Inhale 1 puff into the lungs every 12 (twelve) hours.  3 Unknown at Unknown  . amiodarone (PACERONE) 200 MG tablet Take 200 mg by mouth daily.  1 Unknown at Unknown  . b complex vitamins tablet Take 1 tablet by mouth daily.   Unknown at Unknown  . calcium carbonate (OSCAL) 1500 (600 Ca) MG TABS tablet Take 600 mg of elemental calcium by mouth daily with breakfast.   Unknown at Unknown  . cetirizine (ZYRTEC) 10 MG tablet Take 10 mg by mouth daily.   Unknown at Unknown  . Cholecalciferol (VITAMIN D3) 5000 units TABS Take 1 tablet by mouth daily.   Unknown at Unknown  . metoprolol succinate (TOPROL-XL) 50 MG 24 hr tablet Take 50 mg by mouth daily.   Unknown at Unknown  . Multiple Vitamins-Minerals (MULTIVITAMIN GUMMIES WOMENS) CHEW Chew 1 tablet by mouth daily.   Unknown at Unknown  . olmesartan-hydrochlorothiazide (BENICAR HCT) 40-25 MG tablet Take 1 tablet by mouth daily.  0 Unknown at Unknown  . traMADol (ULTRAM) 50 MG tablet TAKE 1 TO 2 TABLETS BY MOUTH EVERY 4 TO 6 HOURS AS NEEDED  1 prn at prn  . warfarin (  COUMADIN) 2 MG tablet Take 2 mg by mouth daily. On Monday  0 Unknown at Unknown   Social History   Socioeconomic History  . Marital status: Widowed    Spouse name: Not on file  . Number of children: Not on file  . Years of education: Not on file  . Highest education level: Not on file  Occupational History  . Not on file  Social Needs  . Financial resource strain: Not on file  . Food insecurity:    Worry: Not on file    Inability: Not on file  . Transportation needs:    Medical: Not on file     Non-medical: Not on file  Tobacco Use  . Smoking status: Former Research scientist (life sciences)  . Smokeless tobacco: Never Used  Substance and Sexual Activity  . Alcohol use: Not Currently  . Drug use: Not on file  . Sexual activity: Not on file  Lifestyle  . Physical activity:    Days per week: Not on file    Minutes per session: Not on file  . Stress: Not on file  Relationships  . Social connections:    Talks on phone: Not on file    Gets together: Not on file    Attends religious service: Not on file    Active member of club or organization: Not on file    Attends meetings of clubs or organizations: Not on file    Relationship status: Not on file  . Intimate partner violence:    Fear of current or ex partner: Not on file    Emotionally abused: Not on file    Physically abused: Not on file    Forced sexual activity: Not on file  Other Topics Concern  . Not on file  Social History Narrative  . Not on file    Family History  Problem Relation Age of Onset  . Colon cancer Mother   . Diabetes Mother   . Asthma Father       Review of systems complete and found to be negative unless listed above      PHYSICAL EXAM  General: Well developed, well nourished, in no acute distress, lying nearly flat in bed HEENT:  Normocephalic and atramatic Neck:  No JVD.  Lungs: Normal effort of breathing on supplemental oxygen via nasal cannula Heart: Irregularly irregular. 2/6 systolic murmur  Abdomen: nondistended Msk:  Gait not assessed. No obvious deformity.  Extremities: No clubbing, cyanosis or edema.   Neuro: Alert and oriented X 3. Psych:  Good affect, responds appropriately  Labs:   Lab Results  Component Value Date   WBC 18.9 (H) 05/03/2018   HGB 10.4 (L) 05/03/2018   HCT 33.9 (L) 05/03/2018   MCV 86.9 05/03/2018   PLT 167 05/03/2018    Recent Labs  Lab 05/01/18 1837  05/03/18 0444  NA 134*   < > 131*  K 4.4   < > 4.1  CL 98   < > 97*  CO2 23   < > 26  BUN 20   < > 37*   CREATININE 1.63*   < > 1.91*  CALCIUM 8.9   < > 8.7*  PROT 6.7  --   --   BILITOT 0.6  --   --   ALKPHOS 79  --   --   ALT 50*  --   --   AST 76*  --   --   GLUCOSE 255*   < > 150*   < > =  values in this interval not displayed.   Lab Results  Component Value Date   TROPONINI 0.10 ([place]) 05/02/2018   No results found for: CHOL No results found for: HDL No results found for: LDLCALC No results found for: TRIG No results found for: CHOLHDL No results found for: LDLDIRECT    Radiology: Dg Chest 1 View  Result Date: 05/01/2018 CLINICAL DATA:  Shortness of breath, chest pain EXAM: CHEST  1 VIEW COMPARISON:  07/12/2007 FINDINGS: Cardiomegaly with vascular congestion and interstitial prominence concerning for interstitial edema. No visible significant effusions or acute bony abnormality. IMPRESSION: Cardiomegaly, vascular congestion, probable mild interstitial edema. Electronically Signed   By: Rolm Baptise M.D.   On: 05/01/2018 19:12   US Renal  Result Date: 05/02/2018 CLINICAL DATA:  82 year old female with acute kidney insufficiency. Initial encounter. EXAM: RENAL / URINARY TRACT ULTRASOUND COMPLETE COMPARISON:  None. FINDINGS: Right Kidney: Length: 9 cm. Echogenicity within normal limits. No mass or hydronephrosis visualized. Left Kidney: Length: 8.5 cm. Increased echogenicity. No hydronephrosis. Upper pole 1.5 cm cyst. Bladder: Decompressed by Foley catheter. IMPRESSION: 1. No hydronephrosis. 2. Increased left renal parenchyma echogenicity. 3. Left renal 1.5 cm cyst. 4. Bladder decompressed by Foley catheter. Electronically Signed   By: Genia Del M.D.   On: 05/02/2018 13:59   Dg Chest Port 1 View  Result Date: 05/03/2018 CLINICAL DATA:  82 year old female with pneumonia EXAM: PORTABLE CHEST 1 VIEW COMPARISON:  Prior chest x-ray 05/01/2018 FINDINGS: Stable cardiomegaly. Atherosclerotic calcifications are present in the transverse aorta. Patchy airspace opacities in the left lung base.  Otherwise, the lungs are clear save for a background of chronic bronchitic changes. No pulmonary edema, large pleural effusion or pneumothorax. No acute osseous abnormality. IMPRESSION: Patchy left basilar airspace opacity concerning for pneumonia. Aortic Atherosclerosis (ICD10-170.0) Electronically Signed   By: Jacqulynn Cadet M.D.   On: 05/03/2018 10:38    EKG: atrial fibrillation, rate 89 bpm  ASSESSMENT AND PLAN:  1.  Acute respiratory failure with hypoxia and hypercapnia, admitted initially to ICU due to requiring BiPAP, now in step-down unit with nasal cannula, and feeling much better. Secondary to community-acquired pneumonia, COPD exacerbation, and acute on chronic diastolic CHF. 2D echocardiogram reveals normal LV function with moderate AS, mild MR 2.  Elevated troponin, 0.03, 0.23, 0.16, followed by 0.10.  Likely demand supply ischemia in the absence of chest pain or acute ST-T wave abnormalities. 3.  Left bundle branch block 4.  Sepsis due to community-acquired pneumonia, on IV antibiotics and steroids 5.  Acute on chronic diastolic CHF, receiving supplemental oxygen and 1 dose of IV Lasix in the ER.  6.  Moderate aortic stenosis, with aortic valve area 1.09 cm with peak gradient 61 mmHg.  Previous echocardiogram 12/2016 revealed moderate AS with aortic valve area 0.97 cm peak gradient 47.9 mmHg. 7.  Paroxysmal atrial fibrillation on warfarin and amiodarone, rate controlled 8.  Hypertension, currently well controlled 9.  COPD exacerbation 10.  Acute on chronic kidney disease stage III   Recommendations: 1. Agree with overall therapy 2. Continue amiodarone 200 mg daily 3. Continue warfarin with goal INR 2.0-3.0 4. No further cardiac diagnostics recommended at this time.  Signed: Clabe Seal PA-C 05/03/2018, 11:24 AM  This patient encounter was discussed with Dr. Saralyn Pilar who agreed with the plan.

## 2018-05-03 NOTE — Care Management Note (Signed)
Case Management Note  Patient Details  Name: Ashley Bird MRN: [id number] Date of Birth: [birth year]  Subjective/Objective:   RNCM consulted on patient for home health needs and palliative at home. Patient agreeable to home health and thinks she used [facility] 2-3 years ago. Referral placed with [place] from [facility] who agrees to accept the patient. We will also set up outpatient Palliative with [facility]. Consult pending. Patient uses a walker in the home and lives with her daughter. No DME needs. We will await formal discharge orders and proceed with discharge at the appropriate time.                  Action/Plan: Nursing to wean off O2.   Expected Discharge Date:                  Expected Discharge Plan:     In-House Referral:     Discharge planning Services     Post Acute Care Choice:    Choice offered to:     DME Arranged:    DME Agency:     HH Arranged:    HH Agency:     Status of Service:     If discussed at [employer], dates discussed:    Additional Comments:  Ashley Drown Sharai [place], RN 05/03/2018, 10:04 AM

## 2018-05-03 NOTE — Progress Notes (Signed)
Patient ID: Ashley Bird, female   DOB: 10-Mar-1936, 82 y.o.   MRN: [id number]  [facility] PROGRESS NOTE  Ashley Bird WPY:[id number] DOB: 04-10-1936 DOA: 05/01/2018 PCP: Maryland Pink, MD  HPI/Subjective: Shortness of breath somewhat better, still coughing some  Objective: Vitals:   05/03/18 0513 05/03/18 1347  BP: 114/73 119/78  Pulse: 71 79  Resp: 17 18  Temp:  98.3 F (36.8 C)  SpO2: 96% 99%    Intake/Output Summary (Last 24 hours) at 05/03/2018 1414 Last data filed at 05/03/2018 1100 Gross per 24 hour  Intake 92.71 ml  Output 825 ml  Net -732.29 ml   Filed Weights   05/01/18 1827 05/01/18 1856 05/01/18 2320  Weight: 88.9 kg 88.9 kg 87 kg    ROS: Review of Systems  Constitutional: Negative for chills and fever.  Eyes: Negative for blurred vision.  Respiratory: Positive for cough and shortness of breath.   Cardiovascular: Negative for chest pain.  Gastrointestinal: Positive for nausea. Negative for abdominal pain, constipation and vomiting.  Genitourinary: Negative for dysuria.  Musculoskeletal: Negative for joint pain.  Neurological: Negative for dizziness and headaches.   Exam: Physical Exam  Constitutional: She is oriented to person, place, and time.  HENT:  Nose: No mucosal edema.  Mouth/Throat: No oropharyngeal exudate or posterior oropharyngeal edema.  Eyes: Pupils are equal, round, and reactive to light. Conjunctivae, EOM and lids are normal.  Neck: No JVD present. Carotid bruit is not present. No edema present. No thyroid mass and no thyromegaly present.  Cardiovascular: S1 normal and S2 normal. Exam reveals no gallop.  No murmur heard. Pulses:      Dorsalis pedis pulses are 2+ on the right side, and 2+ on the left side.  Respiratory: No respiratory distress. She has decreased breath sounds in the right lower field and the left lower field. She has no wheezes. She has rhonchi in the right lower field and the left lower field. She has no rales.   GI: Soft. Bowel sounds are normal. There is no tenderness.  Musculoskeletal:       Right ankle: She exhibits no swelling.       Left ankle: She exhibits no swelling.  Lymphadenopathy:    She has no cervical adenopathy.  Neurological: She is alert and oriented to person, place, and time. No cranial nerve deficit.  Skin: Skin is warm. No rash noted. Nails show no clubbing.  Psychiatric: She has a normal mood and affect.      Data Reviewed: Basic Metabolic Panel: Recent Labs  Lab 05/01/18 1837 05/02/18 0445 05/02/18 1351 05/03/18 0444  NA 134* 134* 133* 131*  K 4.4 4.0 4.1 4.1  CL 98 100 98 97*  CO2 23 26 25 26   GLUCOSE 255* 213* 158* 150*  BUN 20 27* 33* 37*  CREATININE 1.63* 2.02* 2.02* 1.91*  CALCIUM 8.9 8.5* 8.7* 8.7*  MG  --   --  1.7 1.7  PHOS  --   --  2.9 3.7   Liver Function Tests: Recent Labs  Lab 05/01/18 1837  AST 76*  ALT 50*  ALKPHOS 79  BILITOT 0.6  PROT 6.7  ALBUMIN 3.3*   No results for input(s): LIPASE, AMYLASE in the last 168 hours. No results for input(s): AMMONIA in the last 168 hours. CBC: Recent Labs  Lab 05/01/18 1837 05/02/18 0445 05/03/18 0444  WBC 15.8* 16.6* 18.9*  NEUTROABS  --   --  17.0*  HGB 12.4 11.7* 10.4*  HCT 41.7 37.9  33.9*  MCV 90.5 87.3 86.9  PLT 247 161 167   Cardiac Enzymes: Recent Labs  Lab 05/01/18 1837 05/01/18 2328 05/02/18 0445 05/02/18 1052  TROPONINI 0.03* 0.23* 0.16* 0.10*   BNP (last 3 results) Recent Labs    05/01/18 2328  BNP 991.0*    ProBNP (last 3 results) No results for input(s): PROBNP in the last 8760 hours.  CBG: Recent Labs  Lab 05/02/18 1621 05/02/18 2111 05/03/18 0523 05/03/18 0755 05/03/18 1153  GLUCAP 136* 223* 135* 118* 105*    Recent Results (from the past 240 hour(s))  Urine culture     Status: Abnormal   Collection Time: 05/01/18  6:34 PM  Result Value Ref Range Status   Specimen Description   Final    URINE, RANDOM Performed at [facility], [address], [place], [place] [address]    Special Requests   Final    NONE Performed at [facility], [address], [place], [place] [address]    Culture (A)  Final    <10,000 COLONIES/mL INSIGNIFICANT GROWTH Performed at [facility], [place] [address] [place], [place] [address]    Report Status 05/03/2018 FINAL  Final  Blood Culture (routine x 2)     Status: None (Preliminary result)   Collection Time: 05/01/18  6:39 PM  Result Value Ref Range Status   Specimen Description BLOOD LEFT ANTECUBITAL  Final   Special Requests   Final    BOTTLES DRAWN AEROBIC AND ANAEROBIC Blood Culture adequate volume   Culture   Final    NO GROWTH 2 DAYS Performed at [facility], [address], [place], [place] [address]    Report Status PENDING  Incomplete  Blood Culture (routine x 2)     Status: None (Preliminary result)   Collection Time: 05/01/18  6:39 PM  Result Value Ref Range Status   Specimen Description BLOOD BLOOD RIGHT HAND  Final   Special Requests   Final    BOTTLES DRAWN AEROBIC AND ANAEROBIC Blood Culture adequate volume   Culture   Final    NO GROWTH 2 DAYS Performed at [facility], [address], [place], [place] [address]    Report Status PENDING  Incomplete  MRSA PCR Screening     Status: None   Collection Time: 05/01/18 11:12 PM  Result Value Ref Range Status   MRSA by PCR NEGATIVE NEGATIVE Final    Comment:        The GeneXpert MRSA Assay (FDA approved for NASAL specimens only), is one component of a comprehensive MRSA colonization surveillance program. It is not intended to diagnose MRSA infection nor to guide or monitor treatment for MRSA infections. Performed at [facility], [address] [place], [place] [address]      Studies: Dg Chest 1 View  Result Date: 05/01/2018 CLINICAL DATA:  Shortness of breath, chest pain EXAM: CHEST  1 VIEW COMPARISON:  07/12/2007 FINDINGS: Cardiomegaly with vascular  congestion and interstitial prominence concerning for interstitial edema. No visible significant effusions or acute bony abnormality. IMPRESSION: Cardiomegaly, vascular congestion, probable mild interstitial edema. Electronically Signed   By: Rolm Baptise M.D.   On: 05/01/2018 19:12   US Renal  Result Date: 05/02/2018 CLINICAL DATA:  82 year old female with acute kidney insufficiency. Initial encounter. EXAM: RENAL / URINARY TRACT ULTRASOUND COMPLETE COMPARISON:  None. FINDINGS: Right Kidney: Length: 9 cm. Echogenicity within normal limits. No mass or hydronephrosis visualized. Left Kidney: Length: 8.5 cm. Increased  echogenicity. No hydronephrosis. Upper pole 1.5 cm cyst. Bladder: Decompressed by Foley catheter. IMPRESSION: 1. No hydronephrosis. 2. Increased left renal parenchyma echogenicity. 3. Left renal 1.5 cm cyst. 4. Bladder decompressed by Foley catheter. Electronically Signed   By: Genia Del M.D.   On: 05/02/2018 13:59   Dg Chest Port 1 View  Result Date: 05/03/2018 CLINICAL DATA:  82 year old female with pneumonia EXAM: PORTABLE CHEST 1 VIEW COMPARISON:  Prior chest x-ray 05/01/2018 FINDINGS: Stable cardiomegaly. Atherosclerotic calcifications are present in the transverse aorta. Patchy airspace opacities in the left lung base. Otherwise, the lungs are clear save for a background of chronic bronchitic changes. No pulmonary edema, large pleural effusion or pneumothorax. No acute osseous abnormality. IMPRESSION: Patchy left basilar airspace opacity concerning for pneumonia. Aortic Atherosclerosis (ICD10-170.0) Electronically Signed   By: Jacqulynn Cadet M.D.   On: 05/03/2018 10:38    Scheduled Meds: . amiodarone  200 mg Oral Daily  . aspirin EC  81 mg Oral Daily  . Fluticasone-Salmeterol  1 puff Inhalation BID  . insulin aspart  0-5 Units Subcutaneous QHS  . insulin aspart  0-9 Units Subcutaneous TID WC  . insulin aspart  2 Units Subcutaneous TID WC  . nystatin cream   Topical BID  .  predniSONE  40 mg Oral Q breakfast  . traMADol  50 mg Oral Daily  . warfarin  3 mg Oral ONCE-1800  . Warfarin - Pharmacist Dosing Inpatient   Does not apply q1800   Continuous Infusions: . sodium chloride    . ceFEPime (MAXIPIME) IV Stopped (05/02/18 2057)    Assessment/Plan:  1. Acute on chronic hypoxic hypercapnic respiratory failure.  Respiratory acidosis.  Initially required BiPAP and ICU stepdown monitoring.  Now on nasal cannula.  She has been off oxygen for the last few years.  Give another 40 mg of IV Lasix to improve breathing with diuresis 2. COPD exacerbation -continue prednisone.  Nebulizer treatments and Advair. 3. Acute on chronic diastolic congestive heart failure with moderate aortic stenosis, left bundle branch block, elevated troponin.  Continue aspirin and amiodarone.  Cardiology input appreciated.  Will give another 40 mg of IV Lasix today 4. Acute on chronic kidney disease stage III.  Continue to monitor closely. 5. Paroxysmal atrial fibrillation on amiodarone and warfarin.  QRS widening per central telemetry reporting 6. Type 2 diabetes mellitus on sliding scale.  Diabetes coordinator recommends NovoLog 3 times 3 times daily with meals  We will consult palliative care for goals of care conversation and consider palliative care to follow as an outpatient  Code Status:     Code Status Orders  (From admission, onward)         Start     Ordered   05/01/18 2254  Full code  Continuous     05/01/18 2253        Code Status History    This patient has a current code status but no historical code status.     Family Communication: Daughter at the bedside Disposition Plan: To be determined  Consultants:  Critical care specialist.  Antibiotics:  Cefepime  Time spent: 28 minutes  Darreon L[employer]

## 2018-05-03 NOTE — Progress Notes (Addendum)
New referral for outpatient Palliative to follow at home received from [facility]. Plan is for discharge tomorrow with [facility]. Patient information faxed to referral. Flo Shanks RN, BSN, [facility] [place], hospital liaison [phone]

## 2018-05-03 NOTE — Consult Note (Addendum)
Consultation Note Date: 05/03/2018   Patient Name: Ashley Bird  DOB: 02/17/1936  MRN: [id number]  Age / Sex: 82 y.o., female  PCP: Maryland Pink, MD Referring Physician: Max Sane, MD  Reason for Consultation: Establishing goals of care  HPI/Patient Profile: This is an 82 yo female with a PMH of Paroxysmal Atrial Fibrillation, HTN, HLD, Type II Diabetes Mellitus, COPD, and CAD.  She presented to [facility] ER on 10/21 via EMS with c/o worsening shortness of breath. In the ER she required Bipap.  CXR revealed pulmonary edema. She was also febrile with temp of 102.7 F and tachycardic hr 120's meeting sepsis protocol. Antibiotics were initiated and diuretic was given.   Clinical Assessment and Goals of Care: Patient is resting in bed with an intermittent cough. She was widowed in 1995 and lives with her daughter in an in-law suite. She helps to take care of the great grandchildren. She loves to spend time at church.   She uses a cane and walker for ambulation. She uses a cane a church and for short distances. She uses the walker for longer distances. She grocery shops, and uses an Web designer. She no longer really cooks, but is able to microwave and makes cheese bread for breakfast. She has no issues with incontinence of bladder or bowel. She denies issue with eating.    She used O2 when she was first diagnosed with COPD for around 3 years. Her O2 sats improved and she no longer required the oxygen. She has noticed a decline in her breathing recently. Over the past few weeks she has had shortness of breath with trying to change her sheets where she has had to do one side and rest before changing the other. Two weeks ago she noticed she became winded with dressing herself. For the past week she has had to use a shower chair, but states she is afraid of it because she has fallen off of the chair twice.    We discussed  her diagnosis, prognosis, GOC, EOL wishes disposition and options. She does not want her family present for a [place] conversation and states she will speak with them about her wishes herself privately.   A detailed discussion was had today regarding advanced directives. The difference between an aggressive medical intervention path and a comfort care path was discussed.  We discussed quality of life.   She states a family member was placed on a ventilator and the family had to decide to withdraw care. She tells me she would never want a feeding tube and would never want a tracheostomy. She states she would want to be intubated for respiratory depression or failure, but only for about a week to give providers a chance to determine the problem and attempt to treat. She states she would never want to live in a vegetative state. She states she would never want chest compressions, shocks, or a breathing tube if her breathing or heart stops.   Attempted to complete a MOST form outlining these wishes, however  a visitor came and the conversation was terminated per her wishes.  Will return as able to reattempt form.    She states if she were unable to make decisions, then all  5 of her children would be her decision maker as a group.   Addendum: Visitor who was present earlier came to nurse's station and identifies herself as a daughter. She asked questions about palliative care. I returned to room to inquire what patient would like for me to speak to. She requested I speak with the daughter about palliative care and the [place] conversation we had earlier. We discussed per her wishes and a MOST form was completed and signed by the patient.    I completed a MOST form today. The patient outlined her wishes for the following treatment decisions:  Cardiopulmonary Resuscitation: Do Not Attempt Resuscitation (DNR/No CPR)  Medical Interventions: Full Scope of Treatment: Use intubation, advanced airway interventions,  mechanical ventilation, cardioversion as indicated, medical treatment, IV fluids, etc, also provide comfort measures. Transfer to the hospital if indicated  Antibiotics: Antibiotics if indicated  IV Fluids: IV fluids if indicated  Feeding Tube: No feeding tube   She requested a chaplain come to complete POA papers with her.      SUMMARY OF RECOMMENDATIONS   Patient is amenable to outpatient palliative to follow. Consult placed for chaplain to complete POA papers.    Prognosis:   Unable to determine  Discharge Planning: Home with Palliative Services      Primary Diagnoses: Present on Admission: . Acute systolic CHF (congestive heart failure) ([place]) . Acute respiratory failure with hypoxia and hypercapnia (HCC) . Severe sepsis ([place]) . CAP (community acquired pneumonia) . COPD with acute exacerbation ([place]) . HTN (hypertension) . HLD (hyperlipidemia) . PAF (paroxysmal atrial fibrillation) ([place]) . Acute on chronic respiratory failure with hypoxia and hypercapnia (HCC)   I have reviewed the medical record, interviewed the patient and family, and examined the patient. The following aspects are pertinent.  Past Medical History:  Diagnosis Date  . CAD (coronary artery disease)   . COPD (chronic obstructive pulmonary disease) ([place])   . Diabetes ([place])   . HLD (hyperlipidemia)   . HTN (hypertension)   . PAF (paroxysmal atrial fibrillation) (HCC)    Social History   Socioeconomic History  . Marital status: Widowed    Spouse name: Not on file  . Number of children: Not on file  . Years of education: Not on file  . Highest education level: Not on file  Occupational History  . Not on file  Social Needs  . Financial resource strain: Not on file  . Food insecurity:    Worry: Not on file    Inability: Not on file  . Transportation needs:    Medical: Not on file    Non-medical: Not on file  Tobacco Use  . Smoking status: Former Research scientist (life sciences)  . Smokeless tobacco: Never Used    Substance and Sexual Activity  . Alcohol use: Not Currently  . Drug use: Not on file  . Sexual activity: Not on file  Lifestyle  . Physical activity:    Days per week: Not on file    Minutes per session: Not on file  . Stress: Not on file  Relationships  . Social connections:    Talks on phone: Not on file    Gets together: Not on file    Attends religious service: Not on file    Active member of club or organization: Not on  file    Attends meetings of clubs or organizations: Not on file    Relationship status: Not on file  Other Topics Concern  . Not on file  Social History Narrative  . Not on file   Family History  Problem Relation Age of Onset  . Colon cancer Mother   . Diabetes Mother   . Asthma Father    Scheduled Meds: . amiodarone  200 mg Oral Daily  . aspirin EC  81 mg Oral Daily  . Fluticasone-Salmeterol  1 puff Inhalation BID  . insulin aspart  0-5 Units Subcutaneous QHS  . insulin aspart  0-9 Units Subcutaneous TID WC  . insulin aspart  2 Units Subcutaneous TID WC  . nystatin cream   Topical BID  . predniSONE  40 mg Oral Q breakfast  . traMADol  50 mg Oral Daily  . warfarin  3 mg Oral ONCE-1800  . Warfarin - Pharmacist Dosing Inpatient   Does not apply q1800   Continuous Infusions: . sodium chloride    . ceFEPime (MAXIPIME) IV Stopped (05/02/18 2057)   PRN Meds:.sodium chloride, acetaminophen **OR** acetaminophen, ipratropium-albuterol Medications Prior to Admission:  Prior to Admission medications   Medication Sig Start Date End Date Taking? Authorizing Provider  acetaminophen (TYLENOL) 325 MG tablet Take 325 mg by mouth every 6 (six) hours as needed.   Yes [provider]  ADVAIR DISKUS 100-50 MCG/DOSE AEPB Inhale 1 puff into the lungs every 12 (twelve) hours. 02/23/18  Yes [provider]  amiodarone (PACERONE) 200 MG tablet Take 200 mg by mouth daily. 03/03/18  Yes [provider]  b complex vitamins tablet Take 1 tablet by  mouth daily.   Yes [provider]  calcium carbonate (OSCAL) 1500 (600 Ca) MG TABS tablet Take 600 mg of elemental calcium by mouth daily with breakfast.   Yes [provider]  cetirizine (ZYRTEC) 10 MG tablet Take 10 mg by mouth daily.   Yes [provider]  Cholecalciferol (VITAMIN D3) 5000 units TABS Take 1 tablet by mouth daily.   Yes [provider]  metoprolol succinate (TOPROL-XL) 50 MG 24 hr tablet Take 50 mg by mouth daily. 05/01/18  Yes [provider]  Multiple Vitamins-Minerals (MULTIVITAMIN GUMMIES WOMENS) CHEW Chew 1 tablet by mouth daily.   Yes [provider]  olmesartan-hydrochlorothiazide (BENICAR HCT) 40-25 MG tablet Take 1 tablet by mouth daily. 01/26/18  Yes [provider]  traMADol (ULTRAM) 50 MG tablet TAKE 1 TO 2 TABLETS BY MOUTH EVERY 4 TO 6 HOURS AS NEEDED 04/03/18  Yes [provider]  warfarin (COUMADIN) 2 MG tablet Take 2 mg by mouth daily. On Monday 03/02/18  Yes [provider]   Allergies  Allergen Reactions  . Alendronate   . Doxycycline Swelling    Swelling and hemmorage     . Effexor [Venlafaxine] Other (See Comments)    Body ached   . Ferralet [Iron-Folic [id number]] Nausea And Vomiting    Vomiting   . Ferrous Sulfate Nausea And Vomiting  . Penicillins Swelling    Per pt: swelling and hemmorage     . Prozac [Fluoxetine]   . Simvastatin Other (See Comments)    Muscle pain   . Zoloft [Sertraline Hcl]    Review of Systems  Respiratory: Positive for cough and shortness of breath.     Physical Exam  Constitutional: No distress.  Pulmonary/Chest: Effort normal.  Intermittent cough. O2 by cannula  Neurological: She is alert.  Oriented.  Vital Signs: BP 114/73 (BP Location: Right Arm)   Pulse 71   Temp 98 F (36.7 C) (Oral)   Resp 17   Ht 5\' 3"  (1.6 m)   Wt 87 kg   SpO2 96%   BMI 33.98 kg/m  Pain Scale: 0-10 POSS *See Group Information*:  1-Acceptable,Awake and alert Pain Score: 0-No pain   SpO2: SpO2: 96 % O2 Device:SpO2: 96 % O2 Flow Rate: .O2 Flow Rate (L/min): 2 L/min  IO: Intake/output summary:   Intake/Output Summary (Last 24 hours) at 05/03/2018 1135 Last data filed at 05/03/2018 1010 Gross per 24 hour  Intake 332.71 ml  Output 525 ml  Net -192.29 ml    LBM: Last BM Date: 05/02/18 Baseline Weight: Weight: 88.9 kg Most recent weight: Weight: 87 kg     Palliative Assessment/Data: 60%     Time In: 10:10 Time Out: 11:20 Time Total: 70 min Greater than 50%  of this time was spent counseling and coordinating care related to the above assessment and plan.  Signed by: Asencion Gowda, NP   Please contact Palliative Medicine Team phone at [phone] for questions and concerns.  For individual provider: See Shea Evans

## 2018-05-04 DIAGNOSIS — J9622 Acute and chronic respiratory failure with hypercapnia: Secondary | ICD-10-CM

## 2018-05-04 DIAGNOSIS — J9621 Acute and chronic respiratory failure with hypoxia: Secondary | ICD-10-CM

## 2018-05-04 LAB — GLUCOSE, CAPILLARY
GLUCOSE-CAPILLARY: 100 mg/dL — AB (ref 70–99)
GLUCOSE-CAPILLARY: 94 mg/dL (ref 70–99)

## 2018-05-04 LAB — CALCIUM, IONIZED: Calcium, Ionized, Serum: 4.9 mg/dL (ref 4.5–5.6)

## 2018-05-04 LAB — PROTIME-INR
INR: 2.29
PROTHROMBIN TIME: 24.9 s — AB (ref 11.4–15.2)

## 2018-05-04 MED ORDER — LEVOFLOXACIN 750 MG PO TABS: 750.0000 mg | ORAL_TABLET | Freq: Every day | ORAL | 0 refills | Status: AC

## 2018-05-04 MED ORDER — PREDNISONE 10 MG PO TABS: 10.0000 mg | ORAL_TABLET | Freq: Every day | ORAL | 6 refills | Status: DC

## 2018-05-04 MED ORDER — WARFARIN SODIUM 2 MG PO TABS
2.0000 mg | ORAL_TABLET | Freq: Every day | ORAL | Status: DC
  Filled 2018-05-04: qty 1

## 2018-05-04 NOTE — Progress Notes (Signed)
   05/04/18 1100  Clinical Encounter Type  Visited With Patient  Visit Type Initial (Advance Directives Completion)  Referral From Nurse  Recommendations Follow-up, if requested.  Spiritual Encounters  Spiritual Needs Emotional (AD documentation on file.)  Advance Directives (For Healthcare)  Does Patient Have a Medical Advance Directive? Yes  Type of Advance Directive Living will;Healthcare Power of [place] in Chart? Yes  Copy of Living Will in Chart? Yes  Would patient like information on creating a medical advance directive?  (AD Completed.)  Mental Health Advance Directives  Does Patient Have a Mental Health Advance Directive? Yes (Included in LW.)   At the request of Colette Ribas, and a page from 1C, [place] assisted the patient in the completion of the AD. After notarization, a copy was placed in the patient's record and the original was given to the patient. Chaplain explained to the patient, the importance of keeping the original for use at our healthcare facilities.

## 2018-05-04 NOTE — Progress Notes (Signed)
[facility] Cardiology  SUBJECTIVE: The patient reports feeling much better this morning. She states her breathing feels much better. She denies chest pain or palpitations.   Vitals:   05/03/18 0513 05/03/18 1347 05/03/18 1908 05/04/18 0410  BP: 114/73 119/78 126/72 (!) 111/51  Pulse: 71 79 78 69  Resp: 17 18 19 18   Temp:  98.3 F (36.8 C) 98 F (36.7 C) 97.7 F (36.5 C)  TempSrc:  Oral Oral Oral  SpO2: 96% 99% 100% 100%  Weight:    86.2 kg  Height:         Intake/Output Summary (Last 24 hours) at 05/04/2018 0842 Last data filed at 05/03/2018 1410 Gross per 24 hour  Intake -  Output 1250 ml  Net -1250 ml      PHYSICAL EXAM  General: Well developed, well nourished, in no acute distress HEENT:  Normocephalic and atramatic Neck:  No JVD.  Lungs: Normal effort of breathing with suppl oxygen via nasal cannula. Heart: Irregularly irregular 1-6/6 systolic murmur  Abdomen: nondistended Msk:  Back normal, no obvious deformity. Extremities: No clubbing, cyanosis or edema.   Neuro: Alert and oriented X 3. Psych:  Good affect, responds appropriately   LABS: Basic Metabolic Panel: Recent Labs    05/02/18 1351 05/03/18 0444  NA 133* 131*  K 4.1 4.1  CL 98 97*  CO2 25 26  GLUCOSE 158* 150*  BUN 33* 37*  CREATININE 2.02* 1.91*  CALCIUM 8.7* 8.7*  MG 1.7 1.7  PHOS 2.9 3.7   Liver Function Tests: Recent Labs    05/01/18 1837  AST 76*  ALT 50*  ALKPHOS 79  BILITOT 0.6  PROT 6.7  ALBUMIN 3.3*   No results for input(s): LIPASE, AMYLASE in the last 72 hours. CBC: Recent Labs    05/02/18 0445 05/03/18 0444  WBC 16.6* 18.9*  NEUTROABS  --  17.0*  HGB 11.7* 10.4*  HCT 37.9 33.9*  MCV 87.3 86.9  PLT 161 167   Cardiac Enzymes: Recent Labs    05/01/18 2328 05/02/18 0445 05/02/18 1052  TROPONINI 0.23* 0.16* 0.10*   BNP: Invalid input(s): POCBNP D-Dimer: No results for input(s): DDIMER in the last 72 hours. Hemoglobin A1C: Recent Labs    05/02/18 1052   HGBA1C 6.2*   Fasting Lipid Panel: No results for input(s): CHOL, HDL, LDLCALC, TRIG, CHOLHDL, LDLDIRECT in the last 72 hours. Thyroid Function Tests: No results for input(s): TSH, T4TOTAL, T3FREE, THYROIDAB in the last 72 hours.  Invalid input(s): FREET3 Anemia Panel: No results for input(s): VITAMINB12, FOLATE, FERRITIN, TIBC, IRON, RETICCTPCT in the last 72 hours.  US Renal  Result Date: 05/02/2018 CLINICAL DATA:  82 year old female with acute kidney insufficiency. Initial encounter. EXAM: RENAL / URINARY TRACT ULTRASOUND COMPLETE COMPARISON:  None. FINDINGS: Right Kidney: Length: 9 cm. Echogenicity within normal limits. No mass or hydronephrosis visualized. Left Kidney: Length: 8.5 cm. Increased echogenicity. No hydronephrosis. Upper pole 1.5 cm cyst. Bladder: Decompressed by Foley catheter. IMPRESSION: 1. No hydronephrosis. 2. Increased left renal parenchyma echogenicity. 3. Left renal 1.5 cm cyst. 4. Bladder decompressed by Foley catheter. Electronically Signed   By: Genia Del M.D.   On: 05/02/2018 13:59   Dg Chest Port 1 View  Result Date: 05/03/2018 CLINICAL DATA:  82 year old female with pneumonia EXAM: PORTABLE CHEST 1 VIEW COMPARISON:  Prior chest x-ray 05/01/2018 FINDINGS: Stable cardiomegaly. Atherosclerotic calcifications are present in the transverse aorta. Patchy airspace opacities in the left lung base. Otherwise, the lungs are clear save for a background  of chronic bronchitic changes. No pulmonary edema, large pleural effusion or pneumothorax. No acute osseous abnormality. IMPRESSION: Patchy left basilar airspace opacity concerning for pneumonia. Aortic Atherosclerosis (ICD10-170.0) Electronically Signed   By: Jacqulynn Cadet M.D.   On: 05/03/2018 10:38     Echo EF 55-65%, moderate AS  TELEMETRY: Atrial fibrillation, rate 78 bpm  ASSESSMENT AND PLAN:  Principal Problem:   Acute respiratory failure with hypoxia and hypercapnia (HCC) Active Problems:   Acute  systolic CHF (congestive heart failure) (HCC)   Severe sepsis (HCC)   CAP (community acquired pneumonia)   COPD with acute exacerbation (HCC)   Diabetes ([place])   HTN (hypertension)   HLD (hyperlipidemia)   PAF (paroxysmal atrial fibrillation) (HCC)   Acute on chronic respiratory failure with hypoxia and hypercapnia ([place])    1. Acute on chronic respiratory failure secondary to COPD exacerbation, pneumonia, and acute on chronic diastolic CHF. Patient doing much better after IV Lasix and BiPAP, now on nasal cannula. EF 55-65%. 2. Paroxysmal atrial fibrillation on amiodarone and warfarin. Rate controlled and INR therapeutic. Wide QRS with LBBB intermittently. 3. Moderate aortic stenosis with aortic valve area stable from previous echocardiogram in 2018 4. Acute on chronic kidney disease  Recommendations: 1. Continue amiodarone 200 mg daily 2. Continue warfarin with goal INR 2.0-3.0 3. No further cardiac diagnostics recommended at this time. 4. Recommend follow up with Dr. Ubaldo Glassing 1 week from discharge  Clabe Seal, PA-C 05/04/2018 8:42 AM

## 2018-05-04 NOTE — Progress Notes (Signed)
SATURATION QUALIFICATIONS: (This note is used to comply with regulatory documentation for home oxygen)  Patient Saturations on Room Air at Rest = 95%  Patient Saturations on Room Air while Ambulating = 90-93%  Madlyn Frankel, RN

## 2018-05-04 NOTE — Discharge Instructions (Signed)

## 2018-05-04 NOTE — Progress Notes (Signed)
Daily Progress Note   Patient Name: Ashley Bird       Date: 05/04/2018 DOB: 1936/01/16  Age: 82 y.o. MRN#: [id number] Attending Physician: Max Sane, MD Primary Care Physician: [place] Pink, MD Admit Date: 05/01/2018  Reason for Consultation/Follow-up: Establishing goals of care  Subjective:  Patient is sitting in bedside watching t.v. She is feeling better and is off oxygen at this time. She has filled out POA papers, awaiting notarization. POA papers elect 2 grandchildren for her decision makers and checks box that her wishes cannot be changed by POA.   Recommended D/C with palliative.   Length of Stay: 3  Current Medications: Scheduled Meds:  . amiodarone  200 mg Oral Daily  . aspirin EC  81 mg Oral Daily  . Fluticasone-Salmeterol  1 puff Inhalation BID  . insulin aspart  0-5 Units Subcutaneous QHS  . insulin aspart  0-9 Units Subcutaneous TID WC  . insulin aspart  2 Units Subcutaneous TID WC  . nystatin cream   Topical BID  . predniSONE  40 mg Oral Q breakfast  . traMADol  50 mg Oral Daily  . warfarin  2 mg Oral q1800  . Warfarin - Pharmacist Dosing Inpatient   Does not apply q1800    Continuous Infusions: . sodium chloride    . ceFEPime (MAXIPIME) IV Stopped (05/03/18 1911)    PRN Meds: sodium chloride, acetaminophen **OR** acetaminophen, ipratropium-albuterol  Physical Exam  Constitutional: No distress.  Pulmonary/Chest:  Intermittent cough  Neurological: She is alert.  Oriented            Vital Signs: BP (!) 147/70 (BP Location: Right Arm)   Pulse 82   Temp 97.7 F (36.5 C) (Oral)   Resp 18   Ht 5\' 3"  (1.6 m)   Wt 86.2 kg   SpO2 96%   BMI 33.66 kg/m  SpO2: SpO2: 96 % O2 Device: O2 Device: Room Air O2 Flow Rate: O2 Flow Rate (L/min): 2  L/min  Intake/output summary:   Intake/Output Summary (Last 24 hours) at 05/04/2018 1013 Last data filed at 05/03/2018 1410 Gross per 24 hour  Intake -  Output 1050 ml  Net -1050 ml   LBM: Last BM Date: 05/03/18 Baseline Weight: Weight: 88.9 kg Most recent weight: Weight: 86.2 kg       Palliative  Assessment/Data:  60%    Flowsheet Rows     Most Recent Value  Intake Tab  Referral Department  Hospitalist  Unit at Time of Referral  Med/Surg Unit  Palliative Care Primary Diagnosis  Cardiac  Date Notified  05/03/18  Palliative Care Type  New Palliative care  Reason for referral  Clarify Goals of Care  Date of Admission  05/01/18  Date first seen by Palliative Care  05/03/18  # of days Palliative referral response time  0 Day(s)  # of days IP prior to Palliative referral  2  Clinical Assessment  Psychosocial & Spiritual Assessment  Palliative Care Outcomes      Patient Active Problem List   Diagnosis Date Noted  . Acute systolic CHF (congestive heart failure) ([place]) 05/01/2018  . Acute respiratory failure with hypoxia and hypercapnia ([place]) 05/01/2018  . Severe sepsis ([place]) 05/01/2018  . CAP (community acquired pneumonia) 05/01/2018  . COPD with acute exacerbation ([place]) 05/01/2018  . Diabetes ([place]) 05/01/2018  . HTN (hypertension) 05/01/2018  . HLD (hyperlipidemia) 05/01/2018  . PAF (paroxysmal atrial fibrillation) ([place]) 05/01/2018  . Acute on chronic respiratory failure with hypoxia and hypercapnia (HCC) 05/01/2018    Palliative Care Assessment & Plan    Recommendations/Plan:  Recommend palliative at D/C.   Code Status:    Code Status Orders  (From admission, onward)         Start     Ordered   05/01/18 2254  Full code  Continuous     05/01/18 2253        Code Status History    This patient has a current code status but no historical code status.       Prognosis:   Unable to determine  Discharge Planning:  Home with [place] was discussed with RN  Thank you for allowing the Palliative Medicine Team to assist in the care of this patient.   Total Time 15 min Prolonged Time Billed  no       Greater than 50%  of this time was spent counseling and coordinating care related to the above assessment and plan.  Asencion Gowda, NP  Please contact Palliative Medicine Team phone at [phone] for questions and concerns.

## 2018-05-04 NOTE — Progress Notes (Signed)
ANTICOAGULATION CONSULT NOTE - Initial Consult  Pharmacy Consult for warfarin Indication: atrial fibrillation  Allergies  Allergen Reactions  . Alendronate   . Doxycycline Swelling    Swelling and hemmorage     . Effexor [Venlafaxine] Other (See Comments)    Body ached   . Ferralet [Iron-Folic [id number]] Nausea And Vomiting    Vomiting   . Ferrous Sulfate Nausea And Vomiting  . Penicillins Swelling    Per pt: swelling and hemmorage     . Prozac [Fluoxetine]   . Simvastatin Other (See Comments)    Muscle pain   . Zoloft [Sertraline Hcl]     Patient Measurements: Height: 5\' 3"  (160 cm) Weight: 190 lb 0.6 oz (86.2 kg) IBW/kg (Calculated) : 52.4  Vital Signs: Temp: 97.7 F (36.5 C) (10/24 0410) Temp Source: Oral (10/24 0410) BP: 111/51 (10/24 0410) Pulse Rate: 69 (10/24 0410)  Labs: Recent Labs    05/01/18 1837  05/01/18 2328 05/02/18 0445 05/02/18 1052 05/02/18 1351 05/03/18 0444 05/04/18 0523  HGB 12.4  --   --  11.7*  --   --  10.4*  --   HCT 41.7  --   --  37.9  --   --  33.9*  --   PLT 247  --   --  161  --   --  167  --   LABPROT  --    < > 22.4* 21.3*  --   --  21.6* 24.9*  INR  --    < > 2.00 1.87  --   --  1.91 2.29  CREATININE 1.63*  --   --  2.02*  --  2.02* 1.91*  --   TROPONINI 0.03*  --  0.23* 0.16* 0.10*  --   --   --    < > = values in this interval not displayed.    Estimated Creatinine Clearance: 24.4 mL/min (A) (by C-G formula based on SCr of 1.91 mg/dL (H)).   Medical History: Past Medical History:  Diagnosis Date  . CAD (coronary artery disease)   . COPD (chronic obstructive pulmonary disease) ([place])   . Diabetes ([place])   . HLD (hyperlipidemia)   . HTN (hypertension)   . PAF (paroxysmal atrial fibrillation) (HCC)     Medications:  Scheduled:  . amiodarone  200 mg Oral Daily  . aspirin EC  81 mg Oral Daily  . Fluticasone-Salmeterol  1 puff Inhalation BID  . insulin aspart  0-5 Units Subcutaneous QHS  . insulin  aspart  0-9 Units Subcutaneous TID WC  . insulin aspart  2 Units Subcutaneous TID WC  . nystatin cream   Topical BID  . predniSONE  40 mg Oral Q breakfast  . traMADol  50 mg Oral Daily  . Warfarin - Pharmacist Dosing Inpatient   Does not apply q1800    Assessment: Patient admitted w/ SOB w/ h/o afib and is anticoagulated w/ warfarin PTA: Spoke with patient - she now takes Warfarin 2 mg daily. Per patient, she missed her dose on 10/21.   CHADSVASC score = 6  DATE INR DOSE 10/21 2.00 Missed  10/22 1.87 3mg  10/23 1.91 3mg  10/24 2.29  Goal of Therapy:  INR 2-3 Monitor platelets by anticoagulation protocol: Yes   Plan:  INR therapeutic this morning at 2.29. Will resume patient's home dose of warfarin 2mg  daily.  Will continue to check INR daily while on Abx per protocol.   Drug Interactions Amiodarone (home medication) + cefepime can increase the  effects of warfarin. Will continue to monitor.  Pernell Dupre, PharmD, BCPS Clinical Pharmacist 05/04/2018 7:45 AM

## 2018-05-04 NOTE — Care Management Note (Signed)
Case Management Note  Patient Details  Name: Ashley Bird MRN: [id number] Date of Birth: 04/16/1936  Subjective/Objective:   Discharging today                 Action/Plan: [place] with [facility] notified of discharge.   Expected Discharge Date:  05/04/18               Expected Discharge Plan:     In-House Referral:     Discharge planning Services     Post Acute Care Choice:    Choice offered to:     DME Arranged:    DME Agency:     HH Arranged:    HH Agency:     Status of Service:     If discussed at [employer], dates discussed:    Additional Comments:  Jolly Mango, RN 05/04/2018, 9:45 AM

## 2018-05-04 NOTE — Progress Notes (Signed)
Patient discharged home with daughter via wheelchair by volunteer services. Madlyn Frankel, RN

## 2018-05-04 NOTE — Progress Notes (Signed)
Discharge instructions given and went over with patient at bedside. Prescriptions reviewed. All questions answered. Patient to discharge home. Awaiting transportation. Madlyn Frankel, RN

## 2018-05-04 NOTE — Care Management Important Message (Signed)
Important Message  Patient Details  Name: Ashley Bird MRN: [id number] Date of Birth: 10/13/1935   Medicare Important Message Given:  Yes    [name] 05/04/2018, 11:08 AM

## 2018-05-04 NOTE — Consult Note (Signed)
Pharmacy Antibiotic Note  Ashley Bird is a 82 y.o. female admitted on 05/01/2018 with COPD exacerbation. Pharmacy has been consulted for cefepime dosing. Patient felt ill ~3 days PTA. Marland Kitchen Patient with AKI on CKD on admission.  Plan: Continue Cefepime 1 gm every 24 hours.  Height: 5\' 3"  (160 cm) Weight: 190 lb 0.6 oz (86.2 kg) IBW/kg (Calculated) : 52.4  Temp (24hrs), Avg:98 F (36.7 C), Min:97.7 F (36.5 C), Max:98.3 F (36.8 C)  Recent Labs  Lab 05/01/18 1834 05/01/18 1837 05/01/18 2328 05/02/18 0445 05/02/18 1351 05/03/18 0444  WBC  --  15.8*  --  16.6*  --  18.9*  CREATININE  --  1.63*  --  2.02* 2.02* 1.91*  LATICACIDVEN 3.0*  --  1.8  --   --   --     Estimated Creatinine Clearance: 24.4 mL/min (A) (by C-G formula based on SCr of 1.91 mg/dL (H)).    Allergies  Allergen Reactions  . Alendronate   . Doxycycline Swelling    Swelling and hemmorage     . Effexor [Venlafaxine] Other (See Comments)    Body ached   . Ferralet [Iron-Folic [id number]] Nausea And Vomiting    Vomiting   . Ferrous Sulfate Nausea And Vomiting  . Penicillins Swelling    Per pt: swelling and hemmorage     . Prozac [Fluoxetine]   . Simvastatin Other (See Comments)    Muscle pain   . Zoloft [Sertraline Hcl]     Antimicrobials this admission:\ Cefepime 10/21 >>  Vancomycin 10/21 >> 10/22  Dose adjustments this admission: 10/22 Cefepime transitioned to 1g IV Q24hr.   Microbiology results: 10/21 BCx: NG TD 10/21 UCx:  Insignificant growth  10/21 MRSA PCR: (-)  Thank you for allowing pharmacy to be a part of this patient's care.  Pernell Dupre, PharmD, BCPS Clinical Pharmacist 05/04/2018 7:47 AM

## 2018-05-05 NOTE — Discharge Summary (Signed)
[facility] [place] NAME: Ashley Bird    MR#:  [id number]  DATE OF BIRTH:  02-Mar-1936  DATE OF ADMISSION:  05/01/2018   ADMITTING PHYSICIAN: Lance Coon, MD  DATE OF DISCHARGE: 05/04/2018  1:02 PM  PRIMARY CARE PHYSICIAN: Maryland Pink, MD   ADMISSION DIAGNOSIS:  Shortness of breath [R06.02] Acute respiratory distress [R06.03] Chronic obstructive pulmonary disease with acute exacerbation (HCC) [J44.1] Sepsis with acute hypercapnic respiratory failure, due to unspecified organism, unspecified whether septic shock present ([place]) [A41.9, R65.20, J96.02] DISCHARGE DIAGNOSIS:  Principal Problem:   Acute respiratory failure with hypoxia and hypercapnia (HCC) Active Problems:   Acute systolic CHF (congestive heart failure) (HCC)   Severe sepsis ([place])   CAP (community acquired pneumonia)   COPD with acute exacerbation ([place])   Diabetes ([place])   HTN (hypertension)   HLD (hyperlipidemia)   PAF (paroxysmal atrial fibrillation) (HCC)   Acute on chronic respiratory failure with hypoxia and hypercapnia ([place])  SECONDARY DIAGNOSIS:   Past Medical History:  Diagnosis Date  . CAD (coronary artery disease)   . COPD (chronic obstructive pulmonary disease) ([place])   . Diabetes ([place])   . HLD (hyperlipidemia)   . HTN (hypertension)   . PAF (paroxysmal atrial fibrillation) ([place])    HOSPITAL COURSE:   1. Acute on chronic hypoxic hypercapnic respiratory failure.  Respiratory acidosis.  Initially required BiPAP and ICU stepdown monitoring. Weaned off to nasal cannula.   2. COPD exacerbation -improved with prednisone.  Nebulizer treatments and Advair. 3. Acute on chronic diastolic congestive heart failure with moderate aortic stenosis, left bundle branch block, elevated troponin.  Continue aspirin and amiodarone.  diuresed with IV Lasix as needed 4. Acute on chronic kidney disease stage III.  stable. 5. Paroxysmal atrial fibrillation on amiodarone and  warfarin, therapeutic INR 6. Type 2 diabetes mellitus  DISCHARGE CONDITIONS:  stable CONSULTS OBTAINED:  Treatment Team:  Isaias Cowman, MD DRUG ALLERGIES:   Allergies  Allergen Reactions  . Alendronate   . Doxycycline Swelling    Swelling and hemmorage     . Effexor [Venlafaxine] Other (See Comments)    Body ached   . Ferralet [Iron-Folic [id number]] Nausea And Vomiting    Vomiting   . Ferrous Sulfate Nausea And Vomiting  . Penicillins Swelling    Per pt: swelling and hemmorage     . Prozac [Fluoxetine]   . Simvastatin Other (See Comments)    Muscle pain   . Zoloft [Sertraline Hcl]    DISCHARGE MEDICATIONS:   Allergies as of 05/04/2018      Reactions   Alendronate    Doxycycline Swelling   Swelling and hemmorage      Effexor [venlafaxine] Other (See Comments)   Body ached   Ferralet [iron-folic [id number]] Nausea And Vomiting   Vomiting   Ferrous Sulfate Nausea And Vomiting   Penicillins Swelling   Per pt: swelling and hemmorage    Prozac [fluoxetine]    Simvastatin Other (See Comments)   Muscle pain   Zoloft [sertraline Hcl]       Medication List    TAKE these medications   acetaminophen 325 MG tablet Commonly known as:  TYLENOL Take 325 mg by mouth every 6 (six) hours as needed.   ADVAIR DISKUS 100-50 MCG/DOSE Aepb Generic drug:  Fluticasone-Salmeterol Inhale 1 puff into the lungs every 12 (twelve) hours.   amiodarone 200 MG tablet Commonly known as:  PACERONE Take 200 mg by mouth daily.   b  complex vitamins tablet Take 1 tablet by mouth daily.   calcium carbonate 1500 (600 Ca) MG Tabs tablet Commonly known as:  OSCAL Take 600 mg of elemental calcium by mouth daily with breakfast.   cetirizine 10 MG tablet Commonly known as:  ZYRTEC Take 10 mg by mouth daily.   levofloxacin 750 MG tablet Commonly known as:  LEVAQUIN Take 1 tablet (750 mg total) by mouth daily for 4 days.   metoprolol succinate 50 MG 24 hr  tablet Commonly known as:  TOPROL-XL Take 50 mg by mouth daily.   MULTIVITAMIN GUMMIES WOMENS Diona Fanti 1 tablet by mouth daily.   olmesartan-hydrochlorothiazide 40-25 MG tablet Commonly known as:  BENICAR HCT Take 1 tablet by mouth daily.   predniSONE 10 MG tablet Commonly known as:  DELTASONE Take 1 tablet (10 mg total) by mouth daily with breakfast. Take 30 mg once daily, taper 10 mg daily until done (30-20-10).   traMADol 50 MG tablet Commonly known as:  ULTRAM TAKE 1 TO 2 TABLETS BY MOUTH EVERY 4 TO 6 HOURS AS NEEDED   Vitamin D3 5000 units Tabs Take 1 tablet by mouth daily.   warfarin 2 MG tablet Commonly known as:  COUMADIN Take 2 mg by mouth daily. On Monday        DISCHARGE INSTRUCTIONS:   DIET:  Cardiac diet DISCHARGE CONDITION:  Stable ACTIVITY:  Activity as tolerated OXYGEN:  Home Oxygen: No.  Oxygen Delivery: room air DISCHARGE LOCATION:  home   If you experience worsening of your admission symptoms, develop shortness of breath, life threatening emergency, suicidal or homicidal thoughts you must seek medical attention immediately by calling 911 or calling your MD immediately  if symptoms less severe.  You Must read complete instructions/literature along with all the possible adverse reactions/side effects for all the Medicines you take and that have been prescribed to you. Take any new Medicines after you have completely understood and accpet all the possible adverse reactions/side effects.   Please note  You were cared for by a hospitalist during your hospital stay. If you have any questions about your discharge medications or the care you received while you were in the hospital after you are discharged, you can call the unit and asked to speak with the hospitalist on call if the hospitalist that took care of you is not available. Once you are discharged, your primary care physician will handle any further medical issues. Please note that NO REFILLS for  any discharge medications will be authorized once you are discharged, as it is imperative that you return to your primary care physician (or establish a relationship with a primary care physician if you do not have one) for your aftercare needs so that they can reassess your need for medications and monitor your lab values.    On the day of Discharge:  VITAL SIGNS:  Blood pressure (!) 147/70, pulse 82, temperature 97.7 F (36.5 C), temperature source Oral, resp. rate 18, height 5\' 3"  (1.6 m), weight 86.2 kg, SpO2 96 %. PHYSICAL EXAMINATION:  GENERAL:  82 y.o.-year-old patient lying in the bed with no acute distress.  EYES: Pupils equal, round, reactive to light and accommodation. No scleral icterus. Extraocular muscles intact.  HEENT: Head atraumatic, normocephalic. Oropharynx and nasopharynx clear.  NECK:  Supple, no jugular venous distention. No thyroid enlargement, no tenderness.  LUNGS: Normal breath sounds bilaterally, no wheezing, rales,rhonchi or crepitation. No use of accessory muscles of respiration.  CARDIOVASCULAR: S1, S2 normal. No murmurs, rubs,  or gallops.  ABDOMEN: Soft, non-tender, non-distended. Bowel sounds present. No organomegaly or mass.  EXTREMITIES: No pedal edema, cyanosis, or clubbing.  NEUROLOGIC: Cranial nerves II through XII are intact. Muscle strength 5/5 in all extremities. Sensation intact. Gait not checked.  PSYCHIATRIC: The patient is alert and oriented x 3.  SKIN: No obvious rash, lesion, or ulcer.  DATA REVIEW:   CBC Recent Labs  Lab 05/03/18 0444  WBC 18.9*  HGB 10.4*  HCT 33.9*  PLT 167    Chemistries  Recent Labs  Lab 05/01/18 1837  05/03/18 0444  NA 134*   < > 131*  K 4.4   < > 4.1  CL 98   < > 97*  CO2 23   < > 26  GLUCOSE 255*   < > 150*  BUN 20   < > 37*  CREATININE 1.63*   < > 1.91*  CALCIUM 8.9   < > 8.7*  MG  --    < > 1.7  AST 76*  --   --   ALT 50*  --   --   ALKPHOS 79  --   --   BILITOT 0.6  --   --    < > = values in  this interval not displayed.     Microbiology Results  Results for orders placed or performed during the hospital encounter of 05/01/18  Urine culture     Status: Abnormal   Collection Time: 05/01/18  6:34 PM  Result Value Ref Range Status   Specimen Description   Final    URINE, RANDOM Performed at [facility], [address], [place], [place] [address]    Special Requests   Final    NONE Performed at [facility], [address] [place], [place] [address]    Culture (A)  Final    <10,000 COLONIES/mL INSIGNIFICANT GROWTH Performed at [facility], [place] [address], [place], [place] [address]    Report Status 05/03/2018 FINAL  Final  Blood Culture (routine x 2)     Status: None (Preliminary result)   Collection Time: 05/01/18  6:39 PM  Result Value Ref Range Status   Specimen Description BLOOD LEFT ANTECUBITAL  Final   Special Requests   Final    BOTTLES DRAWN AEROBIC AND ANAEROBIC Blood Culture adequate volume   Culture   Final    NO GROWTH 4 DAYS Performed at [facility], [address], [place], [place] [address]    Report Status PENDING  Incomplete  Blood Culture (routine x 2)     Status: None (Preliminary result)   Collection Time: 05/01/18  6:39 PM  Result Value Ref Range Status   Specimen Description BLOOD BLOOD RIGHT HAND  Final   Special Requests   Final    BOTTLES DRAWN AEROBIC AND ANAEROBIC Blood Culture adequate volume   Culture   Final    NO GROWTH 4 DAYS Performed at [facility], [address], [place], [place] [address]    Report Status PENDING  Incomplete  MRSA PCR Screening     Status: None   Collection Time: 05/01/18 11:12 PM  Result Value Ref Range Status   MRSA by PCR NEGATIVE NEGATIVE Final    Comment:        The GeneXpert MRSA Assay (FDA approved for NASAL specimens only), is one component of a comprehensive MRSA colonization surveillance program. It is not intended to diagnose MRSA infection  nor to guide or  monitor treatment for MRSA infections. Performed at [facility], [address] [place], [place] [address]     RADIOLOGY:  No results found.   Management plans discussed with the patient, family and they are in agreement.  CODE STATUS: Prior   TOTAL TIME TAKING CARE OF THIS PATIENT: 45 minutes.    Max Sane M.D on 05/05/2018 at 5:58 PM  Between 7am to 6pm - Pager - [phone]  After 6pm go to [email or web address] - Proofreader  [facility]  Office  [phone]  CC: Primary care physician; Maryland Pink, MD   Note: This dictation was prepared with Dragon dictation along with smaller phrase technology. Any transcriptional errors that result from this process are unintentional.

## 2018-05-06 LAB — CULTURE, BLOOD (ROUTINE X 2)
CULTURE: NO GROWTH
Culture: NO GROWTH
SPECIAL REQUESTS: ADEQUATE
Special Requests: ADEQUATE

## 2018-05-09 ENCOUNTER — Telehealth: Payer: Self-pay

## 2018-05-09 NOTE — Telephone Encounter (Signed)
EMMI Follow-up: Noted on the report that the patient wasn't sure if she had new Rx's.  I talked with Ashley Bird and she had gotten her new Rx's filled and everything was going well.  No other needs noted.

## 2018-05-10 ENCOUNTER — Telehealth: Payer: Self-pay

## 2018-05-10 NOTE — Telephone Encounter (Signed)
EMMI flagged for loss of interest. CSW contacted patient regarding concerns. CSW spoke with patient and introduced self and explained reason for call. Patient states that she is doing well and at the time of her automated call she was feeling weak and tired and unable to do the things she wanted. Patient states that she is feeling much better. She reports no depression or anxiety and no signs of SI/HI. Completed PHQ-9 with patient and had an overall score of 3. No further concerns at this time.   [place], [place]

## 2018-05-11 ENCOUNTER — Ambulatory Visit: Payer: PRIVATE HEALTH INSURANCE | Admitting: Family

## 2018-05-18 ENCOUNTER — Encounter: Payer: Self-pay | Admitting: Pulmonary Disease

## 2018-05-18 ENCOUNTER — Ambulatory Visit (INDEPENDENT_AMBULATORY_CARE_PROVIDER_SITE_OTHER): Payer: Medicare Other | Admitting: Pulmonary Disease

## 2018-05-18 VITALS — BP 122/70 | HR 72 | Ht 63.0 in | Wt 194.0 lb

## 2018-05-18 DIAGNOSIS — R0609 Other forms of dyspnea: Secondary | ICD-10-CM

## 2018-05-18 DIAGNOSIS — J189 Pneumonia, unspecified organism: Secondary | ICD-10-CM

## 2018-05-18 DIAGNOSIS — J449 Chronic obstructive pulmonary disease, unspecified: Secondary | ICD-10-CM | POA: Diagnosis not present

## 2018-05-18 DIAGNOSIS — J181 Lobar pneumonia, unspecified organism: Secondary | ICD-10-CM

## 2018-05-18 DIAGNOSIS — Z87891 Personal history of nicotine dependence: Secondary | ICD-10-CM

## 2018-05-18 DIAGNOSIS — E668 Other obesity: Secondary | ICD-10-CM

## 2018-05-18 DIAGNOSIS — R531 Weakness: Secondary | ICD-10-CM

## 2018-05-18 DIAGNOSIS — I5032 Chronic diastolic (congestive) heart failure: Secondary | ICD-10-CM

## 2018-05-18 NOTE — Progress Notes (Signed)
PULMONARY CONSULT NOTE  Requesting MD/Service: Hospitalists Date of initial consultation: 05/18/18 Reason for consultation: S/P hospitalization for   PT PROFILE: 82 y.o. female former smoker recently hospitalized with acute hypoxemic/hypercarbic respiratory failure due to CAP and AECOPD.  Also has history of paroxysmal atrial fibrillation, morbid obesity, diastolic heart failure.  DATA: 03/13/15 PFTs: FVC: 2.02 L (89 %pred), FEV1: 1.13 L (64 %pred), FEV1/FVC: 56%, TLC:  L (103 %pred), DLCO 76 %pred, DLCO/VA 108% predicted  07/22/16 PFTs: FVC: 2.10 L (94 %pred), FEV1: 1.11 L (65 %pred), FEV1/FVC: 53%, TLC:  L (102 %pred), DLCO 63 %pred, DLCO/VA 99% predicted 08/20/17 PFTs: FVC: 1.72 L (78 %pred), FEV1: 0.94 L (56 %pred), FEV1/FVC: 54%, TLC:  L (97 %pred), DLCO 54 %pred, DLCO/VA 120% predicted 05/02/18 echocardiogram: LVEF 55-65%.  Mild LVH.  Moderate aortic stenosis, mild MR.  LA moderately dilated.  RA mildly dilated.  RVSP estimated 41 mmHg   INTERVAL:  HPI:  This consultation was made at the time of discharge from her recent hospitalization.  She was hospitalized 10/21-10/24/2019 when she presented with fever and increased dyspnea.  Her discharge diagnoses were COPD exacerbation, community acquired pneumonia, paroxysmal atrial fibrillation, congestive heart failure.  She reports to me that prior to the hospitalization she was feeling "yucky" for a couple of weeks with malaise and increasing shortness of breath.  On the morning of admission she was brought by EMS to the [facility] ED with fever and worsening dyspnea.  Her chest x-ray demonstrated a retrocardiac opacity consistent with pneumonia.  Her admission note indicates that she was wheezing.  Her admission arterial blood gas revealed severe hypercarbia as well as hypoxemia.  Her BNP was elevated at 991  She was treated in the usual fashion and did require a brief stay in the ICU/SDU for administration of BiPAP.  She believes that she is  gradually improving.  However she remains very weak with significant shortness of breath.  She has nonproductive cough.  She denies fever and purulent sputum.  At her baseline she has class II-III dyspnea.  Now she describes class III-IV dyspnea.  She was discharged home.  She does not have home oxygen therapy.  She does have home physical therapy for the next 2 to 3 weeks.  She is maintained on Advair inhaler which she is using compliantly.  At discharge, she was given a nebulizer for nebulized albuterol.  She is using this frequently and reports that it causes her to feel "jittery".  She smoked up to 2 packs of cigarettes per day from age 89 to age 22.  She worked in the [employer]  She was first diagnosed with COPD in 2003.  Although she is significantly overweight, she has lost approximately 60 pounds in the past few years.  She has done this with changes in her diet.  Past Medical History:  Diagnosis Date  . CAD (coronary artery disease)   . COPD (chronic obstructive pulmonary disease) ([place])   . Diabetes ([place])   . HLD (hyperlipidemia)   . HTN (hypertension)   . PAF (paroxysmal atrial fibrillation) ([place])   CKD  Past Surgical History:  Procedure Laterality Date  . ABDOMINAL HYSTERECTOMY    . BREAST EXCISIONAL BIOPSY Left 40 yrs ago   neg  . BREAST EXCISIONAL BIOPSY Left 40 yrs ago   neg    MEDICATIONS: I have reviewed all medications and confirmed regimen as documented  Social History   Socioeconomic History  . Marital status: Widowed    Spouse  name: Not on file  . Number of children: Not on file  . Years of education: Not on file  . Highest education level: Not on file  Occupational History  . Not on file  Social Needs  . Financial resource strain: Not on file  . Food insecurity:    Worry: Not on file    Inability: Not on file  . Transportation needs:    Medical: Not on file    Non-medical: Not on file  Tobacco Use  . Smoking status: Former Smoker     Packs/day: 2.00    Years: 20.00    Pack years: 40.00    Types: Cigarettes    Last attempt to quit: 05/1979    Years since quitting: 39.0  . Smokeless tobacco: Never Used  Substance and Sexual Activity  . Alcohol use: Not Currently  . Drug use: Not on file  . Sexual activity: Not on file  Lifestyle  . Physical activity:    Days per week: Not on file    Minutes per session: Not on file  . Stress: Not on file  Relationships  . Social connections:    Talks on phone: Not on file    Gets together: Not on file    Attends religious service: Not on file    Active member of club or organization: Not on file    Attends meetings of clubs or organizations: Not on file    Relationship status: Not on file  . Intimate partner violence:    Fear of current or ex partner: Not on file    Emotionally abused: Not on file    Physically abused: Not on file    Forced sexual activity: Not on file  Other Topics Concern  . Not on file  Social History Narrative  . Not on file    Family History  Problem Relation Age of Onset  . Colon cancer Mother   . Diabetes Mother   . Asthma Father     ROS: No fever, myalgias/arthralgias, unexplained weight loss or weight gain No new focal weakness or sensory deficits No otalgia, hearing loss, visual changes, nasal and sinus symptoms, mouth and throat problems No neck pain or adenopathy No abdominal pain, N/V/D, diarrhea, change in bowel pattern No dysuria, change in urinary pattern   Vitals:   05/18/18 1117 05/18/18 1129  BP:  122/70  Pulse:  72  SpO2:  96%  Weight: 194 lb (88 kg)   Height: 5\' 3"  (1.6 m)      EXAM:  Gen: Obese, pleasant, No overt respiratory distress at rest, mildly tremulous HEENT: NCAT, sclera white, oropharynx normal Neck: Supple without LAN, thyromegaly.  JVP not well visualized Lungs: breath sounds mildly diminished, percussion normal, adventitious sounds: None Cardiovascular: Slightly irregular, rate controlled, no  murmurs noted Abdomen: Soft, nontender, normal BS Ext: without clubbing, cyanosis, edema Neuro: CNs grossly intact, motor and sensory intact Skin: Limited exam, no lesions noted  DATA:   BMP Latest Ref Rng & Units 05/03/2018 05/02/2018 05/02/2018  Glucose 70 - 99 mg/dL 150(H) 158(H) 213(H)  BUN 8 - 23 mg/dL 37(H) 33(H) 27(H)  Creatinine 0.44 - 1.00 mg/dL 1.91(H) 2.02(H) 2.02(H)  Sodium 135 - 145 mmol/L 131(L) 133(L) 134(L)  Potassium 3.5 - 5.1 mmol/L 4.1 4.1 4.0  Chloride 98 - 111 mmol/L 97(L) 98 100  CO2 22 - 32 mmol/L 26 25 26   Calcium 8.9 - 10.3 mg/dL 8.7(L) 8.7(L) 8.5(L)    CBC Latest Ref Rng & Units  05/03/2018 05/02/2018 05/01/2018  WBC 4.0 - 10.5 K/uL 18.9(H) 16.6(H) 15.8(H)  Hemoglobin 12.0 - 15.0 g/dL 10.4(L) 11.7(L) 12.4  Hematocrit 36.0 - 46.0 % 33.9(L) 37.9 41.7  Platelets 150 - 400 K/uL 167 161 247    CXR 10/21: Cardiomegaly, no overt edema, retrocardiac opacity in LLL  I have personally reviewed all chest radiographs reported above including CXRs and CT chest unless otherwise indicated  IMPRESSION:     ICD-10-CM   1. COPD, moderate ([place]) J44.9 Pulse oximetry, overnight  2. Severe exertional dyspnea, multifactorial R06.09   3. Former smoker Z87.891   4. Recent CAP J18.1 DG Chest 2 View  5. Chronic diastolic congestive heart failure (HCC) I50.32   6. Moderate obesity E66.8   7. Weakness R53.1      PLAN:  1) Continue Advair inhaler, 1 inhalation twice daily.  Rinse mouth after use 2) Continue albuterol inhaler as needed for increased shortness breath, wheezing, chest tightness, cough 3) We will evaluate for nocturnal oxygen needs with an overnight oximetry 4) Continue excellent efforts at weight loss 5) Referral to pulmonary rehab program to be initiated after physical therapy completed  Follow-up in 6 weeks with CXR prior to that visit to ensure resolution of left lower lobe opacity   Merton Border, MD PCCM service Mobile [phone] Pager  [phone] 05/18/2018 2:33 PM

## 2018-05-18 NOTE — Patient Instructions (Addendum)
1) Continue Advair inhaler, 1 inhalation twice daily.  Rinse mouth after use 2) Continue albuterol inhaler as needed for increased shortness breath, wheezing, chest tightness, cough 3) We will evaluate for nocturnal oxygen needs with an overnight oximetry 4) Continue your excellent efforts at weight loss 5) Referral to pulmonary rehab program to be initiated after physical therapy completed  Follow-up in 6 weeks with CXR prior to that visit

## 2018-05-22 ENCOUNTER — Ambulatory Visit: Payer: Medicare Other | Attending: Family | Admitting: Family

## 2018-05-22 ENCOUNTER — Encounter: Payer: Self-pay | Admitting: Family

## 2018-05-22 VITALS — BP 102/86 | HR 73 | Resp 18 | Ht 63.0 in | Wt 193.2 lb

## 2018-05-22 DIAGNOSIS — I110 Hypertensive heart disease with heart failure: Secondary | ICD-10-CM | POA: Insufficient documentation

## 2018-05-22 DIAGNOSIS — Z7901 Long term (current) use of anticoagulants: Secondary | ICD-10-CM | POA: Diagnosis not present

## 2018-05-22 DIAGNOSIS — Z87891 Personal history of nicotine dependence: Secondary | ICD-10-CM | POA: Diagnosis not present

## 2018-05-22 DIAGNOSIS — E119 Type 2 diabetes mellitus without complications: Secondary | ICD-10-CM | POA: Diagnosis not present

## 2018-05-22 DIAGNOSIS — E785 Hyperlipidemia, unspecified: Secondary | ICD-10-CM | POA: Diagnosis not present

## 2018-05-22 DIAGNOSIS — J441 Chronic obstructive pulmonary disease with (acute) exacerbation: Secondary | ICD-10-CM | POA: Insufficient documentation

## 2018-05-22 DIAGNOSIS — I5032 Chronic diastolic (congestive) heart failure: Secondary | ICD-10-CM | POA: Insufficient documentation

## 2018-05-22 DIAGNOSIS — Z880 Allergy status to penicillin: Secondary | ICD-10-CM | POA: Insufficient documentation

## 2018-05-22 DIAGNOSIS — I2510 Atherosclerotic heart disease of native coronary artery without angina pectoris: Secondary | ICD-10-CM | POA: Diagnosis not present

## 2018-05-22 DIAGNOSIS — Z881 Allergy status to other antibiotic agents status: Secondary | ICD-10-CM | POA: Insufficient documentation

## 2018-05-22 DIAGNOSIS — Z888 Allergy status to other drugs, medicaments and biological substances status: Secondary | ICD-10-CM | POA: Insufficient documentation

## 2018-05-22 DIAGNOSIS — I480 Paroxysmal atrial fibrillation: Secondary | ICD-10-CM | POA: Insufficient documentation

## 2018-05-22 DIAGNOSIS — Z79899 Other long term (current) drug therapy: Secondary | ICD-10-CM | POA: Diagnosis not present

## 2018-05-22 DIAGNOSIS — I10 Essential (primary) hypertension: Secondary | ICD-10-CM

## 2018-05-22 DIAGNOSIS — R0602 Shortness of breath: Secondary | ICD-10-CM | POA: Diagnosis present

## 2018-05-22 NOTE — Progress Notes (Signed)
Patient ID: Ashley Bird, female    DOB: 02-Dec-1935, 82 y.o.   MRN: [id number]  HPI  Ms Cueva is a 82 y/o female with a history of CAD, DM, hyperlipidemia, HTN, paroxysmal atrial fibrillation, COPD, previous tobacco use and chronic heart failure.   Echo report from 05/02/18 reviewed and showed an EF of 55-65% along with mild MR, moderate AS and an elevated PA pressure of 41 mm Hg.   Admitted 05/01/18 due to acute COPD / HF exacerbation. Initially needed bipap and then weaned to nasal cannula. Given nebulizer treatments and inhalers. Initially given IV lasix and then transitioned to oral diuretics. Palliative care and cardiology consults were obtained. Discharged after 3 days.   She presents today for her initial visit with a chief complaint of moderate shortness of breath upon minimal exertion. She describes this as chronic in nature having been present for several years. She says that she gets short of breath even when bathing or dressing. She has associated fatigue, head congestion, intermittent chest pain, chronic back pain and light-headedness along with this. She denies any difficulty sleeping, abdominal distention, palpitations, pedal edema, cough or weight gain.   Past Medical History:  Diagnosis Date  . CAD (coronary artery disease)   . CHF (congestive heart failure) ([place])   . COPD (chronic obstructive pulmonary disease) ([place])   . Diabetes ([place])   . HLD (hyperlipidemia)   . HTN (hypertension)   . PAF (paroxysmal atrial fibrillation) ([place])    Past Surgical History:  Procedure Laterality Date  . ABDOMINAL HYSTERECTOMY    . BREAST EXCISIONAL BIOPSY Left 40 yrs ago   neg  . BREAST EXCISIONAL BIOPSY Left 40 yrs ago   neg   Family History  Problem Relation Age of Onset  . Colon cancer Mother   . Diabetes Mother   . Asthma Father    Social History   Tobacco Use  . Smoking status: Former Smoker    Packs/day: 2.00    Years: 20.00    Pack years: 40.00    Types: Cigarettes     Last attempt to quit: 05/1979    Years since quitting: 39.0  . Smokeless tobacco: Never Used  Substance Use Topics  . Alcohol use: Not Currently   Allergies  Allergen Reactions  . Alendronate   . Doxycycline Swelling    Swelling and hemmorage     . Effexor [Venlafaxine] Other (See Comments)    Body ached   . Ferralet [Iron-Folic [id number]] Nausea And Vomiting    Vomiting   . Ferrous Sulfate Nausea And Vomiting  . Penicillins Swelling    Per pt: swelling and hemmorage     . Prozac [Fluoxetine]   . Simvastatin Other (See Comments)    Muscle pain   . Zoloft [Sertraline Hcl]    Prior to Admission medications   Medication Sig Start Date End Date Taking? Authorizing Provider  acetaminophen (TYLENOL) 325 MG tablet Take 325 mg by mouth every 6 (six) hours as needed.   Yes [provider]  ADVAIR DISKUS 100-50 MCG/DOSE AEPB Inhale 1 puff into the lungs every 12 (twelve) hours. 02/23/18  Yes [provider]  albuterol (PROVENTIL HFA;VENTOLIN HFA) 108 (90 Base) MCG/ACT inhaler Inhale into the lungs. 05/11/18 05/11/19 Yes [provider]  amiodarone (PACERONE) 200 MG tablet Take 200 mg by mouth daily. 03/03/18  Yes [provider]  b complex vitamins tablet Take 1 tablet by mouth daily.   Yes [provider]  calcium carbonate (  OSCAL) 1500 (600 Ca) MG TABS tablet Take 600 mg of elemental calcium by mouth daily with breakfast.   Yes [provider]  cetirizine (ZYRTEC) 10 MG tablet Take 10 mg by mouth daily.   Yes [provider]  Cholecalciferol (VITAMIN D3) 5000 units TABS Take 1 tablet by mouth daily.   Yes [provider]  furosemide (LASIX) 20 MG tablet Take 20 mg by mouth as needed.  05/12/18 05/12/19 Yes [provider]  metoprolol succinate (TOPROL-XL) 50 MG 24 hr tablet Take 50 mg by mouth daily. 05/01/18  Yes [provider]  Multiple Vitamins-Minerals (MULTIVITAMIN GUMMIES  WOMENS) CHEW Chew 1 tablet by mouth daily.   Yes [provider]  olmesartan-hydrochlorothiazide (BENICAR HCT) 40-25 MG tablet Take 1 tablet by mouth daily. 01/26/18  Yes [provider]  traMADol (ULTRAM) 50 MG tablet TAKE 1 TO 2 TABLETS BY MOUTH EVERY 4 TO 6 HOURS AS NEEDED 04/03/18  Yes [provider]  warfarin (COUMADIN) 2 MG tablet Take 2 mg by mouth daily. On Monday 03/02/18  Yes [provider]  albuterol (ACCUNEB) 1.25 MG/3ML nebulizer solution Inhale into the lungs. 05/11/18 05/11/19  [provider]    Review of Systems  Constitutional: Positive for fatigue. Negative for appetite change.  HENT: Positive for congestion. Negative for rhinorrhea and sore throat.   Eyes: Negative.   Respiratory: Positive for shortness of breath (with minimal exertion). Negative for cough.   Cardiovascular: Positive for chest pain (at times). Negative for palpitations and leg swelling.  Gastrointestinal: Negative for abdominal distention and abdominal pain.  Endocrine: Negative.   Genitourinary: Negative.   Musculoskeletal: Positive for back pain (chronic). Negative for neck pain.  Skin: Negative.   Allergic/Immunologic: Negative.   Neurological: Positive for light-headedness. Negative for dizziness.  Hematological: Negative for adenopathy. Does not bruise/bleed easily.  Psychiatric/Behavioral: Negative for dysphoric mood and sleep disturbance (sleeping on 2 pillows). The patient is not nervous/anxious.    Vitals:   05/22/18 1056  BP: 102/86  Pulse: 73  Resp: 18  SpO2: 97%  Weight: 193 lb 4 oz (87.7 kg)  Height: 5\' 3"  (1.6 m)   Wt Readings from Last 3 Encounters:  05/22/18 193 lb 4 oz (87.7 kg)  05/18/18 194 lb (88 kg)  05/04/18 190 lb 0.6 oz (86.2 kg)   Lab Results  Component Value Date   CREATININE 1.91 (H) 05/03/2018   CREATININE 2.02 (H) 05/02/2018   CREATININE 2.02 (H) 05/02/2018   Physical Exam  Constitutional: She is oriented to  person, place, and time. She appears well-developed and well-nourished.  HENT:  Head: Normocephalic and atraumatic.  Neck: Normal range of motion. Neck supple. No JVD present.  Cardiovascular: Normal rate and regular rhythm.  Pulmonary/Chest: Effort normal. No respiratory distress. She has no rhonchi. She has no rales.  Abdominal: Soft. She exhibits no distension.  Musculoskeletal:       Right lower leg: She exhibits no tenderness and no edema.       Left lower leg: She exhibits no tenderness and no edema.  Neurological: She is alert and oriented to person, place, and time.  Skin: Skin is warm and dry.  Psychiatric: She has a normal mood and affect. Her behavior is normal.  Nursing note and vitals reviewed.  Assessment & Plan:  1: Chronic heart failure with preserved ejection fraction- - NYHA class III - euvolemic today - weighing daily and she was instructed to call for an overnight weight gain of >2 pounds  or a weekly weight gain of >5 pounds - not adding salt to her food and her daughter has been reading food labels for sodium content. Discussed the importance of closely following a 2000mg  sodium diet and written dietary information was given to her about this.  - saw cardiology Minette Brine) 05/12/18 - BNP 05/01/18 was 991.0 - saw pulmonology Alva Garnet) 05/18/18 - due to have overnight oximetry scheduled - patient says that she's gotten her flu vaccine - has home health nursing, PT and OT currently coming to the home - PharmD reconciled medications with the patient  2: HTN- - BP looks good today - saw PCP Kary Kos) 05/11/18 - BMP 05/03/18 reviewed and showed sodium 131, potassium 4.1, creatinine 1.91 and GFR 24   Patient did not bring her medications nor a list. Each medication was verbally reviewed with the patient and she was encouraged to bring the bottles to every visit to confirm accuracy of list.  Return in 2 months or sooner for any questions/problems before then.

## 2018-05-22 NOTE — Patient Instructions (Signed)
Continue weighing daily and call for an overnight weight gain of > 2 pounds or a weekly weight gain of >5 pounds. 

## 2018-06-28 ENCOUNTER — Other Ambulatory Visit: Payer: Self-pay | Admitting: *Deleted

## 2018-06-28 DIAGNOSIS — J449 Chronic obstructive pulmonary disease, unspecified: Secondary | ICD-10-CM

## 2018-06-29 ENCOUNTER — Ambulatory Visit
Admission: RE | Admit: 2018-06-29 | Discharge: 2018-06-29 | Disposition: A | Discharge status: Home / Self Care | Payer: Medicare Other | Source: Ambulatory Visit | Attending: Pulmonary Disease | Admitting: Pulmonary Disease

## 2018-06-29 ENCOUNTER — Ambulatory Visit (INDEPENDENT_AMBULATORY_CARE_PROVIDER_SITE_OTHER): Payer: Medicare Other | Admitting: Pulmonary Disease

## 2018-06-29 ENCOUNTER — Encounter: Payer: Self-pay | Admitting: Pulmonary Disease

## 2018-06-29 VITALS — BP 110/80 | HR 72 | Resp 16 | Ht 63.0 in | Wt 189.0 lb

## 2018-06-29 DIAGNOSIS — I5032 Chronic diastolic (congestive) heart failure: Secondary | ICD-10-CM | POA: Diagnosis not present

## 2018-06-29 DIAGNOSIS — R0609 Other forms of dyspnea: Secondary | ICD-10-CM

## 2018-06-29 DIAGNOSIS — E668 Other obesity: Secondary | ICD-10-CM | POA: Diagnosis not present

## 2018-06-29 DIAGNOSIS — J181 Lobar pneumonia, unspecified organism: Principal | ICD-10-CM

## 2018-06-29 DIAGNOSIS — Z87891 Personal history of nicotine dependence: Secondary | ICD-10-CM | POA: Diagnosis not present

## 2018-06-29 DIAGNOSIS — R0683 Snoring: Secondary | ICD-10-CM

## 2018-06-29 DIAGNOSIS — J449 Chronic obstructive pulmonary disease, unspecified: Secondary | ICD-10-CM

## 2018-06-29 DIAGNOSIS — J189 Pneumonia, unspecified organism: Secondary | ICD-10-CM

## 2018-06-29 DIAGNOSIS — G4719 Other hypersomnia: Secondary | ICD-10-CM

## 2018-06-29 DIAGNOSIS — G4734 Idiopathic sleep related nonobstructive alveolar hypoventilation: Secondary | ICD-10-CM

## 2018-06-29 NOTE — Patient Instructions (Signed)
Continue Advair 100/50 inhaler, 1 inhalation twice a day.  Rinse mouth after use Continue albuterol inhaler as needed for increased shortness of breath, chest tightness, cough, wheezing Use albuterol nebulizer if the inhaler does not provide sufficient relief Get your chest x-ray today.  I will contact you if there are any important findings Based on overnight oximetry measurement, home sleep study has been ordered Referral to pulmonary rehab Follow-up in 3 months.  Call sooner if needed

## 2018-06-29 NOTE — Progress Notes (Signed)
PULMONARY CONSULT NOTE  Requesting MD/Service: Hospitalists Date of initial consultation: 05/18/18 Reason for consultation: S/P hospitalization for acute respiratory failure with hypoxia and hypercapnia  PT PROFILE: 82 y.o. female former smoker recently hospitalized with acute hypoxemic/hypercarbic respiratory failure due to CAP and AECOPD.  Also has history of paroxysmal atrial fibrillation, morbid obesity, diastolic heart failure.  DATA: 03/13/15 PFTs: FVC: 2.02 L (89 %pred), FEV1: 1.13 L (64 %pred), FEV1/FVC: 56%, TLC:  L (103 %pred), DLCO 76 %pred, DLCO/VA 108% predicted  07/22/16 PFTs: FVC: 2.10 L (94 %pred), FEV1: 1.11 L (65 %pred), FEV1/FVC: 53%, TLC:  L (102 %pred), DLCO 63 %pred, DLCO/VA 99% predicted 08/20/17 PFTs: FVC: 1.72 L (78 %pred), FEV1: 0.94 L (56 %pred), FEV1/FVC: 54%, TLC:  L (97 %pred), DLCO 54 %pred, DLCO/VA 120% predicted 05/02/18 echocardiogram: LVEF 55-65%.  Mild LVH.  Moderate aortic stenosis, mild MR.  LA moderately dilated.  RA mildly dilated.  RVSP estimated 41 mmHg    INTERVAL: Last seen 05/18/2018.  No major pulmonary events  SUBJ:  This is a scheduled follow-up.  She has no new complaints.  She states that she is "better" overall.  She remains on Advair.  She is using this compliantly.  She has not required albuterol since last visit.  She denies shortness of breath, chest pain, cough, purulent sputum, hemoptysis, lower extremity edema, calf tenderness.  She remains limited by weakness and back pain.  Since last visit, and overnight oximetry was performed with results as documented above.  Based on this, a formal polysomnogram has been recommended.  Upon screening questioning regarding this, she does note daytime hypersomnolence.  Her daughter notes that she is a heavy snorer.  She did not get her chest x-ray today.   Vitals:   06/29/18 1101 06/29/18 1105  BP:  110/80  Pulse:  72  Resp: 16   SpO2:  97%  Weight: 189 lb (85.7 kg)   Height: 5\' 3"  (1.6 m)       EXAM:  Gen: NAD, in wheelchair HEENT: NCAT, sclera white Neck: No JVD Lungs: breath sounds full, no wheezes or other adventitious sounds Cardiovascular: RRR, no murmurs Abdomen: Moderately obese, soft, nontender, normal BS Ext: without clubbing, cyanosis, edema Neuro: grossly intact Skin: Limited exam, no lesions noted   DATA:   BMP Latest Ref Rng & Units 05/03/2018 05/02/2018 05/02/2018  Glucose 70 - 99 mg/dL 150(H) 158(H) 213(H)  BUN 8 - 23 mg/dL 37(H) 33(H) 27(H)  Creatinine 0.44 - 1.00 mg/dL 1.91(H) 2.02(H) 2.02(H)  Sodium 135 - 145 mmol/L 131(L) 133(L) 134(L)  Potassium 3.5 - 5.1 mmol/L 4.1 4.1 4.0  Chloride 98 - 111 mmol/L 97(L) 98 100  CO2 22 - 32 mmol/L 26 25 26   Calcium 8.9 - 10.3 mg/dL 8.7(L) 8.7(L) 8.5(L)    CBC Latest Ref Rng & Units 05/03/2018 05/02/2018 05/01/2018  WBC 4.0 - 10.5 K/uL 18.9(H) 16.6(H) 15.8(H)  Hemoglobin 12.0 - 15.0 g/dL 10.4(L) 11.7(L) 12.4  Hematocrit 36.0 - 46.0 % 33.9(L) 37.9 41.7  Platelets 150 - 400 K/uL 167 161 247    CXR: Pending  I have personally reviewed all chest radiographs reported above including CXRs and CT chest unless otherwise indicated  IMPRESSION:     ICD-10-CM   1. Former smoker Z87.891   2. COPD, moderate ([place]) J44.9   3. Chronic diastolic CHF (congestive heart failure) ([facility]) I50.32   4. Moderate obesity E66.8   5. DOE, multifactorial R06.09   6. Nocturnal hypoxemia G47.34   7. Snoring R06.83  8. Daytime hypersomnolence G47.19      PLAN:  1) Continue Advair 100/50 inhaler, 1 inhalation twice daily.  Rinse mouth after use 2) Continue albuterol inhaler as needed for increased shortness breath, wheezing, chest tightness, cough 3) she is to get her chest x-ray after this encounter 4) based on ONO, home sleep study has been ordered 5) Referral to pulmonary rehab program has been made  [place] up in 3 months.  Call sooner if needed   Merton Border, MD PCCM service Mobile [phone] Pager  [phone] 06/29/2018 11:25 AM

## 2018-07-06 ENCOUNTER — Telehealth: Payer: Self-pay | Admitting: Student

## 2018-07-06 NOTE — Telephone Encounter (Signed)
NP Phone call. NP left message for daughter Baker Janus. She had called in to triage stating patient was having back pain. Awaiting return call.

## 2018-07-17 ENCOUNTER — Encounter: Payer: Medicare Other | Attending: Pulmonary Disease

## 2018-07-17 ENCOUNTER — Ambulatory Visit: Payer: PRIVATE HEALTH INSURANCE | Admitting: Family

## 2018-07-17 VITALS — Ht 60.5 in | Wt 189.7 lb

## 2018-07-17 DIAGNOSIS — J449 Chronic obstructive pulmonary disease, unspecified: Secondary | ICD-10-CM | POA: Insufficient documentation

## 2018-07-17 DIAGNOSIS — Z7901 Long term (current) use of anticoagulants: Secondary | ICD-10-CM | POA: Diagnosis not present

## 2018-07-17 DIAGNOSIS — Z87891 Personal history of nicotine dependence: Secondary | ICD-10-CM | POA: Insufficient documentation

## 2018-07-17 DIAGNOSIS — I110 Hypertensive heart disease with heart failure: Secondary | ICD-10-CM | POA: Diagnosis not present

## 2018-07-17 DIAGNOSIS — I480 Paroxysmal atrial fibrillation: Secondary | ICD-10-CM | POA: Diagnosis not present

## 2018-07-17 DIAGNOSIS — Z79899 Other long term (current) drug therapy: Secondary | ICD-10-CM | POA: Insufficient documentation

## 2018-07-17 DIAGNOSIS — I2510 Atherosclerotic heart disease of native coronary artery without angina pectoris: Secondary | ICD-10-CM | POA: Diagnosis not present

## 2018-07-17 DIAGNOSIS — E119 Type 2 diabetes mellitus without complications: Secondary | ICD-10-CM | POA: Diagnosis not present

## 2018-07-17 DIAGNOSIS — I509 Heart failure, unspecified: Secondary | ICD-10-CM | POA: Diagnosis not present

## 2018-07-17 NOTE — Progress Notes (Signed)
Pulmonary Individual Treatment Plan  Patient Details  Name: LIBBEY DUCE MRN: [id number] Date of Birth: [birth year] Referring Provider:     Pulmonary Rehab from 07/17/2018 in [facility] Cardiac and Pulmonary Rehab  Referring Provider  Merton Border MD      Initial Encounter Date:    Pulmonary Rehab from 07/17/2018 in [facility] Cardiac and Pulmonary Rehab  Date  07/17/18      Visit Diagnosis: Chronic obstructive pulmonary disease, unspecified COPD type ([place])  Patient's Home Medications on Admission:  Current Outpatient Medications:  .  acetaminophen (TYLENOL) 325 MG tablet, Take 325 mg by mouth every 6 (six) hours as needed., Disp: , Rfl:  .  ADVAIR DISKUS 100-50 MCG/DOSE AEPB, Inhale 1 puff into the lungs every 12 (twelve) hours., Disp: , Rfl: 3 .  albuterol (ACCUNEB) 1.25 MG/3ML nebulizer solution, Inhale into the lungs., Disp: , Rfl:  .  albuterol (PROVENTIL HFA;VENTOLIN HFA) 108 (90 Base) MCG/ACT inhaler, Inhale into the lungs., Disp: , Rfl:  .  amiodarone (PACERONE) 200 MG tablet, Take 200 mg by mouth daily., Disp: , Rfl: 1 .  b complex vitamins tablet, Take 1 tablet by mouth daily., Disp: , Rfl:  .  calcium carbonate (OSCAL) 1500 (600 Ca) MG TABS tablet, Take 600 mg of elemental calcium by mouth daily with breakfast., Disp: , Rfl:  .  cetirizine (ZYRTEC) 10 MG tablet, Take 10 mg by mouth daily., Disp: , Rfl:  .  Cholecalciferol (VITAMIN D3) 5000 units TABS, Take 1 tablet by mouth daily., Disp: , Rfl:  .  furosemide (LASIX) 20 MG tablet, Take 20 mg by mouth as needed. , Disp: , Rfl:  .  metoprolol succinate (TOPROL-XL) 50 MG 24 hr tablet, Take 50 mg by mouth daily., Disp: , Rfl:  .  Multiple Vitamins-Minerals (MULTIVITAMIN GUMMIES WOMENS) CHEW, Chew 1 tablet by mouth daily., Disp: , Rfl:  .  olmesartan-hydrochlorothiazide (BENICAR HCT) 40-25 MG tablet, Take 1 tablet by mouth daily., Disp: , Rfl: 0 .  traMADol (ULTRAM) 50 MG tablet, TAKE 1 TO 2 TABLETS BY MOUTH EVERY 4 TO 6 HOURS AS NEEDED,  Disp: , Rfl: 1 .  warfarin (COUMADIN) 2 MG tablet, Take 2 mg by mouth daily. On Monday, Disp: , Rfl: 0  Past Medical History: Past Medical History:  Diagnosis Date  . CAD (coronary artery disease)   . CHF (congestive heart failure) ([place])   . COPD (chronic obstructive pulmonary disease) ([place])   . Diabetes ([place])   . HLD (hyperlipidemia)   . HTN (hypertension)   . PAF (paroxysmal atrial fibrillation) ([facility])     Tobacco Use: Social History   Tobacco Use  Smoking Status Former Smoker  . Packs/day: 2.00  . Years: 30.00  . Pack years: 60.00  . Types: Cigarettes  . Last attempt to quit: 05/1979  . Years since quitting: 39.2  Smokeless Tobacco Never Used    Labs: Recent Chemical engineer    Labs for ITP Cardiac and Pulmonary Rehab Latest Ref Rng & Units 05/01/2018 05/02/2018   Hemoglobin A1c 4.8 - 5.6 % - 6.2(H)   PHART 7.350 - 7.450 - 7.40   PCO2ART 32.0 - 48.0 mmHg - 43   HCO3 20.0 - 28.0 mmol/L 27.2 26.6   ACIDBASEDEF 0.0 - 2.0 mmol/L 3.4(H) -   O2SAT % - 94.9       Pulmonary Assessment Scores: Pulmonary Assessment Scores    Row Name 07/17/18 1449         ADL UCSD   ADL  Phase  Entry     SOB Score total  58     Rest  1     Walk  3     Stairs  5     Bath  3     Dress  2     Shop  4       CAT Score   CAT Score  20       mMRC Score   mMRC Score  2        Pulmonary Function Assessment: Pulmonary Function Assessment - 07/17/18 1448      Initial Spirometry Results   FVC%  44 %    FEV1%  47 %    FEV1/FVC Ratio  79.46    Comments  Good patient effort      Post Bronchodilator Spirometry Results   FVC%  52.8 %    FEV1%  54.99 %    FEV1/FVC Ratio  77.63    Comments  Good patient effort      Breath   Bilateral Breath Sounds  Clear    Shortness of Breath  Yes;Panic with Shortness of Breath;Limiting activity       Exercise Target Goals: Exercise Program Goal: Individual exercise prescription set using results from initial 6 min walk test and THRR  while considering  patient's activity barriers and safety.   Exercise Prescription Goal: Initial exercise prescription builds to 30-45 minutes a day of aerobic activity, 2-3 days per week.  Home exercise guidelines will be given to patient during program as part of exercise prescription that the participant will acknowledge.  Activity Barriers & Risk Stratification: Activity Barriers & Cardiac Risk Stratification - 07/17/18 1602      Activity Barriers & Cardiac Risk Stratification   Activity Barriers  Back Problems;Arthritis;Joint Problems;Deconditioning;Muscular Weakness;Shortness of Breath;Balance Concerns;History of Falls;Assistive Device   arthritis in right knee, chronic hip and back pain      6 Minute Walk: 6 Minute Walk    Row Name 07/17/18 1556         6 Minute Walk   Phase  Initial     Distance  525 feet     Walk Time  4.7 minutes     # of Rest Breaks  2 18 sec, 1 min     MPH  1.57     METS  0.73     RPE  19     Perceived Dyspnea   3     VO2 Peak  2.57     Symptoms  Yes (comment)     Comments  back pain 10/10; nausea 5/10; feels wobbly and afraid of falling     Resting HR  70 bpm     Resting BP  134/66     Resting Oxygen Saturation   96 %     Exercise Oxygen Saturation  during 6 min walk  92 %     Max Ex. HR  110 bpm     Max Ex. BP  154/72     2 Minute Post BP  142/74       Interval HR   1 Minute HR  83     2 Minute HR  107     3 Minute HR  102     4 Minute HR  110     5 Minute HR  93     6 Minute HR  105     2 Minute Post HR  84  Interval Heart Rate?  Yes       Interval Oxygen   Interval Oxygen?  Yes     Baseline Oxygen Saturation %  96 %     1 Minute Oxygen Saturation %  92 %     1 Minute Liters of Oxygen  0 L Room Air     2 Minute Oxygen Saturation %  93 %     2 Minute Liters of Oxygen  0 L     3 Minute Oxygen Saturation %  94 %     3 Minute Liters of Oxygen  0 L     4 Minute Oxygen Saturation %  93 %     4 Minute Liters of Oxygen  0 L     5  Minute Oxygen Saturation %  98 %     5 Minute Liters of Oxygen  0 L     6 Minute Oxygen Saturation %  94 %     6 Minute Liters of Oxygen  0 L     2 Minute Post Oxygen Saturation %  95 %     2 Minute Post Liters of Oxygen  0 L       Oxygen Initial Assessment: Oxygen Initial Assessment - 07/17/18 1448      Home Oxygen   Home Oxygen Device  None    Sleep Oxygen Prescription  None    Home Exercise Oxygen Prescription  None    Home at Rest Exercise Oxygen Prescription  None      Initial 6 min Walk   Oxygen Used  None      Program Oxygen Prescription   Program Oxygen Prescription  None      Intervention   Short Term Goals  To learn and exhibit compliance with exercise, home and travel O2 prescription;To learn and understand importance of monitoring SPO2 with pulse oximeter and demonstrate accurate use of the pulse oximeter.;To learn and understand importance of maintaining oxygen saturations>88%;To learn and demonstrate proper pursed lip breathing techniques or other breathing techniques.;To learn and demonstrate proper use of respiratory medications    Long  Term Goals  Exhibits compliance with exercise, home and travel O2 prescription;Verbalizes importance of monitoring SPO2 with pulse oximeter and return demonstration;Maintenance of O2 saturations>88%;Exhibits proper breathing techniques, such as pursed lip breathing or other method taught during program session;Compliance with respiratory medication;Demonstrates proper use of MDI's       Oxygen Re-Evaluation:   Oxygen Discharge (Final Oxygen Re-Evaluation):   Initial Exercise Prescription: Initial Exercise Prescription - 07/17/18 1600      Date of Initial Exercise RX and Referring Provider   Date  07/17/18    Referring Provider  Merton Border MD      Treadmill   MPH  1    Grade  0.5    Minutes  15    METs  1.83      NuStep   Level  1    SPM  80    Minutes  15    METs  1.8      Biostep-RELP   Level  1    SPM   50    Minutes  15    METs  2      Prescription Details   Frequency (times per week)  3    Duration  Progress to 45 minutes of aerobic exercise without signs/symptoms of physical distress      Intensity   THRR 40-80% of Max Heartrate  98-126  Ratings of Perceived Exertion  11-13    Perceived Dyspnea  0-4      Progression   Progression  Continue to progress workloads to maintain intensity without signs/symptoms of physical distress.      Resistance Training   Training Prescription  Yes    Weight  3 lbs    Reps  10-15       Perform Capillary Blood Glucose checks as needed.  Exercise Prescription Changes: Exercise Prescription Changes    Row Name 07/17/18 1600             Response to Exercise   Blood Pressure (Admit)  134/66       Blood Pressure (Exercise)  154/72       Blood Pressure (Exit)  142/74       Heart Rate (Admit)  70 bpm       Heart Rate (Exercise)  110 bpm       Heart Rate (Exit)  84 bpm       Oxygen Saturation (Admit)  96 %       Oxygen Saturation (Exercise)  92 %       Oxygen Saturation (Exit)  95 %       Rating of Perceived Exertion (Exercise)  19       Perceived Dyspnea (Exercise)  3       Symptoms  back pain 10/10; nausea 5/10; feels wobbly and afraid to fall       Comments  walk test results          Exercise Comments:   Exercise Goals and Review: Exercise Goals    Row Name 07/17/18 1605             Exercise Goals   Increase Physical Activity  Yes       Intervention  Provide advice, education, support and counseling about physical activity/exercise needs.;Develop an individualized exercise prescription for aerobic and resistive training based on initial evaluation findings, risk stratification, comorbidities and participant's personal goals.       Expected Outcomes  Short Term: Attend rehab on a regular basis to increase amount of physical activity.;Long Term: Add in home exercise to make exercise part of routine and to increase amount  of physical activity.;Long Term: Exercising regularly at least 3-5 days a week.       Increase Strength and Stamina  Yes       Intervention  Provide advice, education, support and counseling about physical activity/exercise needs.;Develop an individualized exercise prescription for aerobic and resistive training based on initial evaluation findings, risk stratification, comorbidities and participant's personal goals.       Expected Outcomes  Short Term: Increase workloads from initial exercise prescription for resistance, speed, and METs.;Short Term: Perform resistance training exercises routinely during rehab and add in resistance training at home;Long Term: Improve cardiorespiratory fitness, muscular endurance and strength as measured by increased METs and functional capacity (6MWT)       Able to understand and use rate of perceived exertion (RPE) scale  Yes       Intervention  Provide education and explanation on how to use RPE scale       Expected Outcomes  Short Term: Able to use RPE daily in rehab to express subjective intensity level;Long Term:  Able to use RPE to guide intensity level when exercising independently       Able to understand and use Dyspnea scale  Yes       Intervention  Provide education and explanation  on how to use Dyspnea scale       Expected Outcomes  Long Term: Able to use Dyspnea scale to guide intensity level when exercising independently;Short Term: Able to use Dyspnea scale daily in rehab to express subjective sense of shortness of breath during exertion       Knowledge and understanding of Target Heart Rate Range (THRR)  Yes       Intervention  Provide education and explanation of THRR including how the numbers were predicted and where they are located for reference       Expected Outcomes  Short Term: Able to state/look up THRR;Short Term: Able to use daily as guideline for intensity in rehab;Long Term: Able to use THRR to govern intensity when exercising independently        Able to check pulse independently  Yes       Intervention  Provide education and demonstration on how to check pulse in carotid and radial arteries.;Review the importance of being able to check your own pulse for safety during independent exercise       Expected Outcomes  Short Term: Able to explain why pulse checking is important during independent exercise;Long Term: Able to check pulse independently and accurately       Understanding of Exercise Prescription  Yes       Intervention  Provide education, explanation, and written materials on patient's individual exercise prescription       Expected Outcomes  Short Term: Able to explain program exercise prescription;Long Term: Able to explain home exercise prescription to exercise independently          Exercise Goals Re-Evaluation :   Discharge Exercise Prescription (Final Exercise Prescription Changes): Exercise Prescription Changes - 07/17/18 1600      Response to Exercise   Blood Pressure (Admit)  134/66    Blood Pressure (Exercise)  154/72    Blood Pressure (Exit)  142/74    Heart Rate (Admit)  70 bpm    Heart Rate (Exercise)  110 bpm    Heart Rate (Exit)  84 bpm    Oxygen Saturation (Admit)  96 %    Oxygen Saturation (Exercise)  92 %    Oxygen Saturation (Exit)  95 %    Rating of Perceived Exertion (Exercise)  19    Perceived Dyspnea (Exercise)  3    Symptoms  back pain 10/10; nausea 5/10; feels wobbly and afraid to fall    Comments  walk test results       Nutrition:  Target Goals: Understanding of nutrition guidelines, daily intake of sodium <1561m, cholesterol <2024m calories 30% from fat and 7% or less from saturated fats, daily to have 5 or more servings of fruits and vegetables.  Biometrics: Pre Biometrics - 07/17/18 1606      Pre Biometrics   Height  5' 0.5" (1.537 m)    Weight  189 lb 11.2 oz (86 kg)    Waist Circumference  41 inches    Hip Circumference  48 inches    Waist to Hip Ratio  0.85 %    BMI  (Calculated)  36.42    Single Leg Stand  1.46 seconds        Nutrition Therapy Plan and Nutrition Goals: Nutrition Therapy & Goals - 07/17/18 1454      Personal Nutrition Goals   Nutrition Goal  Learn to eat healthier    Personal Goal #2  Lose weight      Intervention Plan   Intervention  Prescribe, educate and counsel regarding individualized specific dietary modifications aiming towards targeted core components such as weight, hypertension, lipid management, diabetes, heart failure and other comorbidities.;Nutrition handout(s) given to patient.    Expected Outcomes  Short Term Goal: Understand basic principles of dietary content, such as calories, fat, sodium, cholesterol and nutrients.;Long Term Goal: Adherence to prescribed nutrition plan.       Nutrition Assessments: Nutrition Assessments - 07/17/18 1543      MEDFICTS Scores   Pre Score  3       Nutrition Goals Re-Evaluation:   Nutrition Goals Discharge (Final Nutrition Goals Re-Evaluation):   Psychosocial: Target Goals: Acknowledge presence or absence of significant depression and/or stress, maximize coping skills, provide positive support system. Participant is able to verbalize types and ability to use techniques and skills needed for reducing stress and depression.   Initial Review & Psychosocial Screening: Initial Psych Review & Screening - 07/17/18 1452      Initial Review   Current issues with  Current Sleep Concerns      Family Dynamics   Good Support System?  Yes    Comments  Her daughter is her support system. She has a positive outlook.      Barriers   Psychosocial barriers to participate in program  There are no identifiable barriers or psychosocial needs.;The patient should benefit from training in stress management and relaxation.      Screening Interventions   Interventions  Encouraged to exercise;Program counselor consult;To provide support and resources with identified psychosocial needs;Provide  feedback about the scores to participant    Expected Outcomes  Long Term Goal: Stressors or current issues are controlled or eliminated.;Short Term goal: Utilizing psychosocial counselor, staff and physician to assist with identification of specific Stressors or current issues interfering with healing process. Setting desired goal for each stressor or current issue identified.;Short Term goal: Identification and review with participant of any Quality of Life or Depression concerns found by scoring the questionnaire.;Long Term goal: The participant improves quality of Life and PHQ9 Scores as seen by post scores and/or verbalization of changes       Quality of Life Scores:  Scores of 19 and below usually indicate a poorer quality of life in these areas.  A difference of  2-3 points is a clinically meaningful difference.  A difference of 2-3 points in the total score of the Quality of Life Index has been associated with significant improvement in overall quality of life, self-image, physical symptoms, and general health in studies assessing change in quality of life.  PHQ-9: Recent Review Flowsheet Data    Depression screen [facility] 2/9 07/17/2018 05/10/2018   Decreased Interest 0 1   Down, Depressed, Hopeless 0 0   PHQ - 2 Score 0 1   Altered sleeping 2 0   Tired, decreased energy 2 1   Change in appetite 0 0   Feeling bad or failure about yourself  0 0   Trouble concentrating 0 1   Moving slowly or fidgety/restless 0 0   Suicidal thoughts 0 0   PHQ-9 Score 4 3   Difficult doing work/chores Somewhat difficult Somewhat difficult     Interpretation of Total Score  Total Score Depression Severity:  1-4 = Minimal depression, 5-9 = Mild depression, 10-14 = Moderate depression, 15-19 = Moderately severe depression, 20-27 = Severe depression   Psychosocial Evaluation and Intervention:   Psychosocial Re-Evaluation:   Psychosocial Discharge (Final Psychosocial  Re-Evaluation):   Education: Education Goals: Education classes will  be provided on a weekly basis, covering required topics. Participant will state understanding/return demonstration of topics presented.  Learning Barriers/Preferences: Learning Barriers/Preferences - 07/17/18 1455      Learning Barriers/Preferences   Learning Barriers  Hearing;Sight   wears glasses   Learning Preferences  None       Education Topics:  Initial Evaluation Education: - Verbal, written and demonstration of respiratory meds, oximetry and breathing techniques. Instruction on use of nebulizers and MDIs and importance of monitoring MDI activations.   Pulmonary Rehab from 07/17/2018 in [facility] Cardiac and Pulmonary Rehab  Date  07/17/18  Educator  [facility]  Instruction Review Code  1- Verbalizes Understanding      General Nutrition Guidelines/Fats and Fiber: -Group instruction provided by verbal, written material, models and posters to present the general guidelines for heart healthy nutrition. Gives an explanation and review of dietary fats and fiber.   Controlling Sodium/Reading Food Labels: -Group verbal and written material supporting the discussion of sodium use in heart healthy nutrition. Review and explanation with models, verbal and written materials for utilization of the food label.   Exercise Physiology & General Exercise Guidelines: - Group verbal and written instruction with models to review the exercise physiology of the cardiovascular system and associated critical values. Provides general exercise guidelines with specific guidelines to those with heart or lung disease.    Aerobic Exercise & Resistance Training: - Gives group verbal and written instruction on the various components of exercise. Focuses on aerobic and resistive training programs and the benefits of this training and how to safely progress through these programs.   Flexibility, Balance, Mind/Body Relaxation: Provides group  verbal/written instruction on the benefits of flexibility and balance training, including mind/body exercise modes such as yoga, pilates and tai chi.  Demonstration and skill practice provided.   Stress and Anxiety: - Provides group verbal and written instruction about the health risks of elevated stress and causes of high stress.  Discuss the correlation between heart/lung disease and anxiety and treatment options. Review healthy ways to manage with stress and anxiety.   Depression: - Provides group verbal and written instruction on the correlation between heart/lung disease and depressed mood, treatment options, and the stigmas associated with seeking treatment.   Exercise & Equipment Safety: - Individual verbal instruction and demonstration of equipment use and safety with use of the equipment.   Pulmonary Rehab from 07/17/2018 in [facility] Cardiac and Pulmonary Rehab  Date  07/17/18  Educator  [facility]  Instruction Review Code  1- Verbalizes Understanding      Infection Prevention: - Provides verbal and written material to individual with discussion of infection control including proper hand washing and proper equipment cleaning during exercise session.   Pulmonary Rehab from 07/17/2018 in [facility] Cardiac and Pulmonary Rehab  Date  07/17/18  Educator  [facility]  Instruction Review Code  1- Verbalizes Understanding      Falls Prevention: - Provides verbal and written material to individual with discussion of falls prevention and safety.   Pulmonary Rehab from 07/17/2018 in [facility] Cardiac and Pulmonary Rehab  Date  07/17/18  Educator  [facility]  Instruction Review Code  1- Verbalizes Understanding      Diabetes: - Individual verbal and written instruction to review signs/symptoms of diabetes, desired ranges of glucose level fasting, after meals and with exercise. Advice that pre and post exercise glucose checks will be done for 3 sessions at entry of program.   Pulmonary Rehab from 07/17/2018 in [facility] Cardiac and  Pulmonary Rehab  Date  07/17/18  Educator  [facility]  Instruction Review Code  1- Verbalizes Understanding      Chronic Lung Diseases: - Group verbal and written instruction to review updates, respiratory medications, advancements in procedures and treatments. Discuss use of supplemental oxygen including available portable oxygen systems, continuous and intermittent flow rates, concentrators, personal use and safety guidelines. Review proper use of inhaler and spacers. Provide informative websites for self-education.    Energy Conservation: - Provide group verbal and written instruction for methods to conserve energy, plan and organize activities. Instruct on pacing techniques, use of adaptive equipment and posture/positioning to relieve shortness of breath.   Triggers and Exacerbations: - Group verbal and written instruction to review types of environmental triggers and ways to prevent exacerbations. Discuss weather changes, air quality and the benefits of nasal washing. Review warning signs and symptoms to help prevent infections. Discuss techniques for effective airway clearance, coughing, and vibrations.   AED/CPR: - Group verbal and written instruction with the use of models to demonstrate the basic use of the AED with the basic ABC's of resuscitation.   Anatomy and Physiology of the Lungs: - Group verbal and written instruction with the use of models to provide basic lung anatomy and physiology related to function, structure and complications of lung disease.   Anatomy & Physiology of the Heart: - Group verbal and written instruction and models provide basic cardiac anatomy and physiology, with the coronary electrical and arterial systems. Review of Valvular disease and Heart Failure   Cardiac Medications: - Group verbal and written instruction to review commonly prescribed medications for heart disease. Reviews the medication, class of the drug, and side effects.   Know Your  Numbers and Risk Factors: -Group verbal and written instruction about important numbers in your health.  Discussion of what are risk factors and how they play a role in the disease process.  Review of Cholesterol, Blood Pressure, Diabetes, and BMI and the role they play in your overall health.   Sleep Hygiene: -Provides group verbal and written instruction about how sleep can affect your health.  Define sleep hygiene, discuss sleep cycles and impact of sleep habits. Review good sleep hygiene tips.    Other: -Provides group and verbal instruction on various topics (see comments)    Knowledge Questionnaire Score: Knowledge Questionnaire Score - 07/17/18 1455      Knowledge Questionnaire Score   Pre Score  15/18   reviewed with patient       Core Components/Risk Factors/Patient Goals at Admission: Personal Goals and Risk Factors at Admission - 07/17/18 1455      Core Components/Risk Factors/Patient Goals on Admission    Weight Management  Yes;Obesity;Weight Loss    Intervention  Weight Management: Develop a combined nutrition and exercise program designed to reach desired caloric intake, while maintaining appropriate intake of nutrient and fiber, sodium and fats, and appropriate energy expenditure required for the weight goal.;Weight Management: Provide education and appropriate resources to help participant work on and attain dietary goals.;Obesity: Provide education and appropriate resources to help participant work on and attain dietary goals.    Admit Weight  189 lb (85.7 kg)    Goal Weight: Short Term  184 lb (83.5 kg)    Goal Weight: Long Term  150 lb (68 kg)    Expected Outcomes  Short Term: Continue to assess and modify interventions until short term weight is achieved;Long Term: Adherence to nutrition and physical activity/exercise program aimed toward attainment of established weight goal;Weight Maintenance:  Understanding of the daily nutrition guidelines, which includes 25-35%  calories from fat, 7% or less cal from saturated fats, less than '200mg'$  cholesterol, less than 1.5gm of sodium, & 5 or more servings of fruits and vegetables daily;Weight Loss: Understanding of general recommendations for a balanced deficit meal plan, which promotes 1-2 lb weight loss per week and includes a negative energy balance of [phone] kcal/d;Understanding recommendations for meals to include 15-35% energy as protein, 25-35% energy from fat, 35-60% energy from carbohydrates, less than '200mg'$  of dietary cholesterol, 20-35 gm of total fiber daily;Understanding of distribution of calorie intake throughout the day with the consumption of 4-5 meals/snacks    Improve shortness of breath with ADL's  Yes    Intervention  Provide education, individualized exercise plan and daily activity instruction to help decrease symptoms of SOB with activities of daily living.    Expected Outcomes  Short Term: Improve cardiorespiratory fitness to achieve a reduction of symptoms when performing ADLs;Long Term: Be able to perform more ADLs without symptoms or delay the onset of symptoms    Diabetes  Yes    Intervention  Provide education about signs/symptoms and action to take for hypo/hyperglycemia.;Provide education about proper nutrition, including hydration, and aerobic/resistive exercise prescription along with prescribed medications to achieve blood glucose in normal ranges: Fasting glucose 65-99 mg/dL    Expected Outcomes  Short Term: Participant verbalizes understanding of the signs/symptoms and immediate care of hyper/hypoglycemia, proper foot care and importance of medication, aerobic/resistive exercise and nutrition plan for blood glucose control.;Long Term: Attainment of HbA1C < 7%.    Heart Failure  Yes   AFID   Intervention  Provide a combined exercise and nutrition program that is supplemented with education, support and counseling about heart failure. Directed toward relieving symptoms such as shortness of  breath, decreased exercise tolerance, and extremity edema.    Expected Outcomes  Improve functional capacity of life;Short term: Attendance in program 2-3 days a week with increased exercise capacity. Reported lower sodium intake. Reported increased fruit and vegetable intake. Reports medication compliance.;Short term: Daily weights obtained and reported for increase. Utilizing diuretic protocols set by physician.;Long term: Adoption of self-care skills and reduction of barriers for early signs and symptoms recognition and intervention leading to self-care maintenance.    Hypertension  Yes    Intervention  Provide education on lifestyle modifcations including regular physical activity/exercise, weight management, moderate sodium restriction and increased consumption of fresh fruit, vegetables, and low fat dairy, alcohol moderation, and smoking cessation.;Monitor prescription use compliance.    Expected Outcomes  Short Term: Continued assessment and intervention until BP is < 140/56m HG in hypertensive participants. < 130/827mHG in hypertensive participants with diabetes, heart failure or chronic kidney disease.;Long Term: Maintenance of blood pressure at goal levels.       Core Components/Risk Factors/Patient Goals Review:    Core Components/Risk Factors/Patient Goals at Discharge (Final Review):    ITP Comments: ITP Comments    Row Name 07/17/18 1523           ITP Comments  Medical Evaluation completed. Chart sent for review and changes to Dr. MaEmily Filbertirector of [facility]osis can be found in [facility]          Comments: Initial ITP

## 2018-07-17 NOTE — Progress Notes (Signed)
Daily Session Note  Patient Details  Name: Ashley Bird MRN: [id number] Date of Birth: 12/02/1935 Referring Provider:     Pulmonary Rehab from 07/17/2018 in [facility]  Referring Provider  Merton Border MD      Encounter Date: 07/17/2018  Check In: Session Check In - 07/17/18 1505      Check-In   Supervising physician immediately available to respond to emergencies  [facility] immediately available ER MD    Physician(s)  Dr. Kerman Passey and     Location  [facility]-Cardiac & Pulmonary Rehab    Staff Present  Alberteen Sam, MA, RCEP, CCRP, Exercise Physiologist;Cabrina Shiroma Tessie Fass RCP,RRT,BSRT    Medication changes reported      No    Fall or balance concerns reported     No    Warm-up and Cool-down  Not performed (comment)   medical evaluation   Resistance Training Performed  No    VAD Patient?  No    PAD/SET Patient?  No      Pain Assessment   Currently in Pain?  No/denies        Exercise Prescription Changes - 07/17/18 1600      Response to Exercise   Blood Pressure (Admit)  134/66    Blood Pressure (Exercise)  154/72    Blood Pressure (Exit)  142/74    Heart Rate (Admit)  70 bpm    Heart Rate (Exercise)  110 bpm    Heart Rate (Exit)  84 bpm    Oxygen Saturation (Admit)  96 %    Oxygen Saturation (Exercise)  92 %    Oxygen Saturation (Exit)  95 %    Rating of Perceived Exertion (Exercise)  19    Perceived Dyspnea (Exercise)  3    Symptoms  back pain 10/10; nausea 5/10; feels wobbly and afraid to fall    Comments  walk test results       Social History   Tobacco Use  Smoking Status Former Smoker  . Packs/day: 2.00  . Years: 30.00  . Pack years: 60.00  . Types: Cigarettes  . Last attempt to quit: 05/1979  . Years since quitting: 39.2  Smokeless Tobacco Never Used    Goals Met:  Exercise tolerated well Personal goals reviewed Queuing for purse lip breathing No report of cardiac concerns or symptoms Strength training completed today  Goals  Unmet:  Not Applicable  Comments:  6 Minute Walk    Row Name 07/17/18 1556         6 Minute Walk   Phase  Initial     Distance  525 feet     Walk Time  4.7 minutes     # of Rest Breaks  2 18 sec, 1 min     MPH  1.57     METS  0.73     RPE  19     Perceived Dyspnea   3     VO2 Peak  2.57     Symptoms  Yes (comment)     Comments  back pain 10/10; nausea 5/10; feels wobbly and afraid of falling     Resting HR  70 bpm     Resting BP  134/66     Resting Oxygen Saturation   96 %     Exercise Oxygen Saturation  during 6 min walk  92 %     Max Ex. HR  110 bpm     Max Ex. BP  154/72  2 Minute Post BP  142/74       Interval HR   1 Minute HR  83     2 Minute HR  107     3 Minute HR  102     4 Minute HR  110     5 Minute HR  93     6 Minute HR  105     2 Minute Post HR  84     Interval Heart Rate?  Yes       Interval Oxygen   Interval Oxygen?  Yes     Baseline Oxygen Saturation %  96 %     1 Minute Oxygen Saturation %  92 %     1 Minute Liters of Oxygen  0 L Room Air     2 Minute Oxygen Saturation %  93 %     2 Minute Liters of Oxygen  0 L     3 Minute Oxygen Saturation %  94 %     3 Minute Liters of Oxygen  0 L     4 Minute Oxygen Saturation %  93 %     4 Minute Liters of Oxygen  0 L     5 Minute Oxygen Saturation %  98 %     5 Minute Liters of Oxygen  0 L     6 Minute Oxygen Saturation %  94 %     6 Minute Liters of Oxygen  0 L     2 Minute Post Oxygen Saturation %  95 %     2 Minute Post Liters of Oxygen  0 L       Service Time 1430-1530   Dr. Emily Filbert is Medical Director for [facility] and [facility].

## 2018-07-17 NOTE — Patient Instructions (Signed)
Patient Instructions  Patient Details  Name: Ashley Bird MRN: [id number] Date of Birth: Aug 09, 1935 Referring Provider:  Wilhelmina Mcardle, MD Below are your personal goals for exercise, nutrition, and risk factors. Our goal is to help you stay on track towards obtaining and maintaining these goals. We will be discussing your progress on these goals with you throughout the program. Initial Exercise Prescription: Initial Exercise Prescription - 07/17/18 1600      Date of Initial Exercise RX and Referring Provider   Date  07/17/18    Referring Provider  Merton Border MD      Treadmill   MPH  1    Grade  0.5    Minutes  15    METs  1.83      NuStep   Level  1    SPM  80    Minutes  15    METs  1.8      Biostep-RELP   Level  1    SPM  50    Minutes  15    METs  2      Prescription Details   Frequency (times per week)  3    Duration  Progress to 45 minutes of aerobic exercise without signs/symptoms of physical distress      Intensity   THRR 40-80% of Max Heartrate  98-126    Ratings of Perceived Exertion  11-13    Perceived Dyspnea  0-4      Progression   Progression  Continue to progress workloads to maintain intensity without signs/symptoms of physical distress.      Resistance Training   Training Prescription  Yes    Weight  3 lbs    Reps  10-15      Exercise Goals: Frequency: Be able to perform aerobic exercise two to three times per week in program working toward 2-5 days per week of home exercise. Intensity: Work with a perceived exertion of 11 (fairly light) - 15 (hard) while following your exercise prescription.  We will make changes to your prescription with you as you progress through the program. Duration: Be able to do 30 to 45 minutes of continuous aerobic exercise in addition to a 5 minute warm-up and a 5 minute cool-down routine. Nutrition Goals: Your personal nutrition goals will be established when you do your nutrition analysis with the  dietician. The following are general nutrition guidelines to follow: Cholesterol < 200mg /day Sodium < 1500mg /day Fiber: Women over 50 yrs - 21 grams per day Personal Goals: Personal Goals and Risk Factors at Admission - 07/17/18 1455      Core Components/Risk Factors/Patient Goals on Admission    Weight Management  Yes;Obesity;Weight Loss    Intervention  Weight Management: Develop a combined nutrition and exercise program designed to reach desired caloric intake, while maintaining appropriate intake of nutrient and fiber, sodium and fats, and appropriate energy expenditure required for the weight goal.;Weight Management: Provide education and appropriate resources to help participant work on and attain dietary goals.;Obesity: Provide education and appropriate resources to help participant work on and attain dietary goals.    Admit Weight  189 lb (85.7 kg)    Goal Weight: Short Term  184 lb (83.5 kg)    Goal Weight: Long Term  150 lb (68 kg)    Expected Outcomes  Short Term: Continue to assess and modify interventions until short term weight is achieved;Long Term: Adherence to nutrition and physical activity/exercise program aimed toward attainment of established weight goal;Weight  Maintenance: Understanding of the daily nutrition guidelines, which includes 25-35% calories from fat, 7% or less cal from saturated fats, less than 200mg  cholesterol, less than 1.5gm of sodium, & 5 or more servings of fruits and vegetables daily;Weight Loss: Understanding of general recommendations for a balanced deficit meal plan, which promotes 1-2 lb weight loss per week and includes a negative energy balance of [phone] kcal/d;Understanding recommendations for meals to include 15-35% energy as protein, 25-35% energy from fat, 35-60% energy from carbohydrates, less than 200mg  of dietary cholesterol, 20-35 gm of total fiber daily;Understanding of distribution of calorie intake throughout the day with the consumption of  4-5 meals/snacks    Improve shortness of breath with ADL's  Yes    Intervention  Provide education, individualized exercise plan and daily activity instruction to help decrease symptoms of SOB with activities of daily living.    Expected Outcomes  Short Term: Improve cardiorespiratory fitness to achieve a reduction of symptoms when performing ADLs;Long Term: Be able to perform more ADLs without symptoms or delay the onset of symptoms    Diabetes  Yes    Intervention  Provide education about signs/symptoms and action to take for hypo/hyperglycemia.;Provide education about proper nutrition, including hydration, and aerobic/resistive exercise prescription along with prescribed medications to achieve blood glucose in normal ranges: Fasting glucose 65-99 mg/dL    Expected Outcomes  Short Term: Participant verbalizes understanding of the signs/symptoms and immediate care of hyper/hypoglycemia, proper foot care and importance of medication, aerobic/resistive exercise and nutrition plan for blood glucose control.;Long Term: Attainment of HbA1C < 7%.    Heart Failure  Yes   AFID   Intervention  Provide a combined exercise and nutrition program that is supplemented with education, support and counseling about heart failure. Directed toward relieving symptoms such as shortness of breath, decreased exercise tolerance, and extremity edema.    Expected Outcomes  Improve functional capacity of life;Short term: Attendance in program 2-3 days a week with increased exercise capacity. Reported lower sodium intake. Reported increased fruit and vegetable intake. Reports medication compliance.;Short term: Daily weights obtained and reported for increase. Utilizing diuretic protocols set by physician.;Long term: Adoption of self-care skills and reduction of barriers for early signs and symptoms recognition and intervention leading to self-care maintenance.    Hypertension  Yes    Intervention  Provide education on lifestyle  modifcations including regular physical activity/exercise, weight management, moderate sodium restriction and increased consumption of fresh fruit, vegetables, and low fat dairy, alcohol moderation, and smoking cessation.;Monitor prescription use compliance.    Expected Outcomes  Short Term: Continued assessment and intervention until BP is < 140/28mm HG in hypertensive participants. < 130/64mm HG in hypertensive participants with diabetes, heart failure or chronic kidney disease.;Long Term: Maintenance of blood pressure at goal levels.      Tobacco Use Initial Evaluation: Social History   Tobacco Use  Smoking Status Former Smoker  . Packs/day: 2.00  . Years: 30.00  . Pack years: 60.00  . Types: Cigarettes  . Last attempt to quit: 05/1979  . Years since quitting: 39.2  Smokeless Tobacco Never Used   Exercise Goals and Review: Exercise Goals    Row Name 07/17/18 1605             Exercise Goals   Increase Physical Activity  Yes       Intervention  Provide advice, education, support and counseling about physical activity/exercise needs.;Develop an individualized exercise prescription for aerobic and resistive training based on initial evaluation findings, risk stratification,  comorbidities and participant's personal goals.       Expected Outcomes  Short Term: Attend rehab on a regular basis to increase amount of physical activity.;Long Term: Add in home exercise to make exercise part of routine and to increase amount of physical activity.;Long Term: Exercising regularly at least 3-5 days a week.       Increase Strength and Stamina  Yes       Intervention  Provide advice, education, support and counseling about physical activity/exercise needs.;Develop an individualized exercise prescription for aerobic and resistive training based on initial evaluation findings, risk stratification, comorbidities and participant's personal goals.       Expected Outcomes  Short Term: Increase workloads  from initial exercise prescription for resistance, speed, and METs.;Short Term: Perform resistance training exercises routinely during rehab and add in resistance training at home;Long Term: Improve cardiorespiratory fitness, muscular endurance and strength as measured by increased METs and functional capacity (6MWT)       Able to understand and use rate of perceived exertion (RPE) scale  Yes       Intervention  Provide education and explanation on how to use RPE scale       Expected Outcomes  Short Term: Able to use RPE daily in rehab to express subjective intensity level;Long Term:  Able to use RPE to guide intensity level when exercising independently       Able to understand and use Dyspnea scale  Yes       Intervention  Provide education and explanation on how to use Dyspnea scale       Expected Outcomes  Long Term: Able to use Dyspnea scale to guide intensity level when exercising independently;Short Term: Able to use Dyspnea scale daily in rehab to express subjective sense of shortness of breath during exertion       Knowledge and understanding of Target Heart Rate Range (THRR)  Yes       Intervention  Provide education and explanation of THRR including how the numbers were predicted and where they are located for reference       Expected Outcomes  Short Term: Able to state/look up THRR;Short Term: Able to use daily as guideline for intensity in rehab;Long Term: Able to use THRR to govern intensity when exercising independently       Able to check pulse independently  Yes       Intervention  Provide education and demonstration on how to check pulse in carotid and radial arteries.;Review the importance of being able to check your own pulse for safety during independent exercise       Expected Outcomes  Short Term: Able to explain why pulse checking is important during independent exercise;Long Term: Able to check pulse independently and accurately       Understanding of Exercise Prescription  Yes        Intervention  Provide education, explanation, and written materials on patient's individual exercise prescription       Expected Outcomes  Short Term: Able to explain program exercise prescription;Long Term: Able to explain home exercise prescription to exercise independently        Copy of goals given to participant.

## 2018-07-18 ENCOUNTER — Telehealth: Payer: Self-pay | Admitting: Pulmonary Disease

## 2018-07-18 ENCOUNTER — Telehealth: Payer: Self-pay

## 2018-07-18 DIAGNOSIS — J449 Chronic obstructive pulmonary disease, unspecified: Secondary | ICD-10-CM

## 2018-07-18 NOTE — Telephone Encounter (Signed)
Patients states that her insurance is changing this month and will not be able to start [facility] until the third week of February. Informed patient to call back in February for a definitive start date.

## 2018-07-18 NOTE — Telephone Encounter (Signed)
Called patient at 1:45 pm inquiring about the HST device that was sent out on 07/17/2018. Pt stated that her daughter returned the device to the [facility] instead of the [facility].  HST was delivered to [facility] and Tammy B returned the device to the clinic.    Pt's daughter, Natelie was notified that device has been located. Nothing else needed at this time.  Rhonda J Cobb

## 2018-07-19 DIAGNOSIS — G4733 Obstructive sleep apnea (adult) (pediatric): Secondary | ICD-10-CM

## 2018-07-20 ENCOUNTER — Ambulatory Visit: Payer: Medicare Other | Attending: Family | Admitting: Family

## 2018-07-20 ENCOUNTER — Encounter: Payer: Self-pay | Admitting: Family

## 2018-07-20 VITALS — BP 126/63 | HR 75 | Resp 18 | Ht 62.0 in | Wt 190.1 lb

## 2018-07-20 DIAGNOSIS — J441 Chronic obstructive pulmonary disease with (acute) exacerbation: Secondary | ICD-10-CM | POA: Insufficient documentation

## 2018-07-20 DIAGNOSIS — Z79899 Other long term (current) drug therapy: Secondary | ICD-10-CM | POA: Diagnosis not present

## 2018-07-20 DIAGNOSIS — I480 Paroxysmal atrial fibrillation: Secondary | ICD-10-CM | POA: Diagnosis not present

## 2018-07-20 DIAGNOSIS — E119 Type 2 diabetes mellitus without complications: Secondary | ICD-10-CM | POA: Insufficient documentation

## 2018-07-20 DIAGNOSIS — Z7901 Long term (current) use of anticoagulants: Secondary | ICD-10-CM | POA: Diagnosis not present

## 2018-07-20 DIAGNOSIS — I110 Hypertensive heart disease with heart failure: Secondary | ICD-10-CM | POA: Diagnosis present

## 2018-07-20 DIAGNOSIS — Z880 Allergy status to penicillin: Secondary | ICD-10-CM | POA: Insufficient documentation

## 2018-07-20 DIAGNOSIS — Z882 Allergy status to sulfonamides status: Secondary | ICD-10-CM | POA: Insufficient documentation

## 2018-07-20 DIAGNOSIS — Z888 Allergy status to other drugs, medicaments and biological substances status: Secondary | ICD-10-CM | POA: Insufficient documentation

## 2018-07-20 DIAGNOSIS — Z881 Allergy status to other antibiotic agents status: Secondary | ICD-10-CM | POA: Insufficient documentation

## 2018-07-20 DIAGNOSIS — I2510 Atherosclerotic heart disease of native coronary artery without angina pectoris: Secondary | ICD-10-CM | POA: Diagnosis not present

## 2018-07-20 DIAGNOSIS — G4733 Obstructive sleep apnea (adult) (pediatric): Secondary | ICD-10-CM | POA: Diagnosis not present

## 2018-07-20 DIAGNOSIS — I5032 Chronic diastolic (congestive) heart failure: Secondary | ICD-10-CM | POA: Insufficient documentation

## 2018-07-20 DIAGNOSIS — I10 Essential (primary) hypertension: Secondary | ICD-10-CM

## 2018-07-20 DIAGNOSIS — Z87891 Personal history of nicotine dependence: Secondary | ICD-10-CM | POA: Insufficient documentation

## 2018-07-20 NOTE — Progress Notes (Signed)
Patient ID: Ashley Bird, female    DOB: 1935-08-22, 83 y.o.   MRN: [id number]  HPI  Ms Barbier is a 83 y/o female with a history of CAD, DM, hyperlipidemia, HTN, paroxysmal atrial fibrillation, COPD, previous tobacco use and chronic heart failure.   Echo report from 05/02/18 reviewed and showed an EF of 55-65% along with mild MR, moderate AS and an elevated PA pressure of 41 mm Hg.   Admitted 05/01/18 due to acute COPD / HF exacerbation. Initially needed bipap and then weaned to nasal cannula. Given nebulizer treatments and inhalers. Initially given IV lasix and then transitioned to oral diuretics. Palliative care and cardiology consults were obtained. Discharged after 3 days.   She presents today for a follow-up visit with a chief complaint of moderate shortness of breath upon minimal exertion. She describes this as chronic in nature having been present for several years. She has associated fatigue and chronic back pain along with this. She denies any difficulty sleeping, abdominal distention, palpitations, pedal edema, chest pain, cough, dizziness or weight gain. Recently had a home sleep study completed and a referral to pulmonary rehab has also been made.   Past Medical History:  Diagnosis Date  . CAD (coronary artery disease)   . CHF (congestive heart failure) ([place])   . COPD (chronic obstructive pulmonary disease) ([place])   . Diabetes ([place])   . HLD (hyperlipidemia)   . HTN (hypertension)   . PAF (paroxysmal atrial fibrillation) ([place])    Past Surgical History:  Procedure Laterality Date  . ABDOMINAL HYSTERECTOMY    . BREAST EXCISIONAL BIOPSY Left 40 yrs ago   neg  . BREAST EXCISIONAL BIOPSY Left 40 yrs ago   neg   Family History  Problem Relation Age of Onset  . Colon cancer Mother   . Diabetes Mother   . Asthma Father    Social History   Tobacco Use  . Smoking status: Former Smoker    Packs/day: 2.00    Years: 30.00    Pack years: 60.00    Types: Cigarettes    Last  attempt to quit: 05/1979    Years since quitting: 39.2  . Smokeless tobacco: Never Used  Substance Use Topics  . Alcohol use: Not Currently   Allergies  Allergen Reactions  . Alendronate   . Doxycycline Swelling    Swelling and hemmorage     . Effexor [Venlafaxine] Other (See Comments)    Body ached   . Ferralet [Iron-Folic [id number]] Nausea And Vomiting    Vomiting   . Ferrous Sulfate Nausea And Vomiting  . Penicillins Swelling    Per pt: swelling and hemmorage     . Prozac [Fluoxetine]   . Simvastatin Other (See Comments)    Muscle pain   . Zoloft [Sertraline Hcl]    Prior to Admission medications   Medication Sig Start Date End Date Taking? Authorizing Provider  acetaminophen (TYLENOL) 325 MG tablet Take 325 mg by mouth every 6 (six) hours as needed.   Yes [provider]  ADVAIR DISKUS 100-50 MCG/DOSE AEPB Inhale 1 puff into the lungs every 12 (twelve) hours. 02/23/18  Yes [provider]  albuterol (ACCUNEB) 1.25 MG/3ML nebulizer solution Inhale into the lungs. 05/11/18 05/11/19 Yes [provider]  albuterol (PROVENTIL HFA;VENTOLIN HFA) 108 (90 Base) MCG/ACT inhaler Inhale into the lungs. 05/11/18 05/11/19 Yes [provider]  amiodarone (PACERONE) 200 MG tablet Take 200 mg by mouth daily. 03/03/18  Yes [provider]  b  complex vitamins tablet Take 1 tablet by mouth daily.   Yes [provider]  calcium carbonate (OSCAL) 1500 (600 Ca) MG TABS tablet Take 600 mg of elemental calcium by mouth daily with breakfast.   Yes [provider]  cetirizine (ZYRTEC) 10 MG tablet Take 10 mg by mouth daily.   Yes [provider]  Cholecalciferol (VITAMIN D3) 5000 units TABS Take 1 tablet by mouth daily.   Yes [provider]  furosemide (LASIX) 20 MG tablet Take 20 mg by mouth as needed.  05/12/18 05/12/19 Yes [provider]  metoprolol succinate (TOPROL-XL) 50 MG 24 hr tablet Take 50  mg by mouth daily. 05/01/18  Yes [provider]  Multiple Vitamins-Minerals (MULTIVITAMIN GUMMIES WOMENS) CHEW Chew 1 tablet by mouth daily.   Yes [provider]  olmesartan-hydrochlorothiazide (BENICAR HCT) 40-25 MG tablet Take 1 tablet by mouth daily. 01/26/18  Yes [provider]  traMADol (ULTRAM) 50 MG tablet TAKE 1 TO 2 TABLETS BY MOUTH EVERY 4 TO 6 HOURS AS NEEDED 04/03/18  Yes [provider]  warfarin (COUMADIN) 2 MG tablet Take 2 mg by mouth daily. On Monday 03/02/18  Yes [provider]    Review of Systems  Constitutional: Positive for fatigue. Negative for appetite change.  HENT: Positive for congestion. Negative for rhinorrhea and sore throat.   Eyes: Negative.   Respiratory: Positive for shortness of breath (with minimal exertion). Negative for cough.   Cardiovascular: Negative for chest pain, palpitations and leg swelling.  Gastrointestinal: Negative for abdominal distention and abdominal pain.  Endocrine: Negative.   Genitourinary: Negative.   Musculoskeletal: Positive for back pain (chronic). Negative for neck pain.  Skin: Negative.   Allergic/Immunologic: Negative.   Neurological: Negative for dizziness and light-headedness.  Hematological: Negative for adenopathy. Does not bruise/bleed easily.  Psychiatric/Behavioral: Negative for dysphoric mood and sleep disturbance (sleeping on 2 pillows). The patient is not nervous/anxious.    Vitals:   07/20/18 1345  BP: 126/63  Pulse: 75  Resp: 18  SpO2: 98%  Weight: 190 lb 2 oz (86.2 kg)  Height: 5\' 2"  (1.575 m)   Wt Readings from Last 3 Encounters:  07/20/18 190 lb 2 oz (86.2 kg)  07/17/18 189 lb 11.2 oz (86 kg)  06/29/18 189 lb (85.7 kg)   Lab Results  Component Value Date   CREATININE 1.91 (H) 05/03/2018   CREATININE 2.02 (H) 05/02/2018   CREATININE 2.02 (H) 05/02/2018    Physical Exam Vitals signs and nursing note reviewed.  Constitutional:      Appearance: She is  well-developed.  HENT:     Head: Normocephalic and atraumatic.  Neck:     Musculoskeletal: Normal range of motion and neck supple.     Vascular: No JVD.  Cardiovascular:     Rate and Rhythm: Normal rate and regular rhythm.  Pulmonary:     Effort: Pulmonary effort is normal. No respiratory distress.     Breath sounds: No rhonchi or rales.  Abdominal:     General: There is no distension.     Palpations: Abdomen is soft.  Musculoskeletal:     Right lower leg: She exhibits no tenderness. No edema.     Left lower leg: She exhibits no tenderness. No edema.  Skin:    General: Skin is warm and dry.  Neurological:     Mental Status: She is alert and oriented to person, place, and time.  Psychiatric:        Behavior: Behavior normal.  Assessment & Plan:  1: Chronic heart failure with preserved ejection fraction- - NYHA class III - euvolemic today - weighing daily and she was reminded to call for an overnight weight gain of >2 pounds or a weekly weight gain of >5 pounds - weight down 3 pounds since she was last here 2 months ago - not adding salt to her food and her daughter has been reading food labels for sodium content. Reminded to closely follow a 2000mg  sodium diet  - saw cardiology Minette Brine) 05/12/18 - BNP 05/01/18 was 991.0 - saw pulmonology Alva Garnet) 06/29/18 - patient says that she's gotten her flu vaccine - home health has finished  2: HTN- - BP looks good today - saw PCP Kary Kos) 06/29/18 - BMP 06/26/18 reviewed and showed sodium 140, potassium 4.2, creatinine 2.1 and GFR 23   Patient did not bring her medications nor a list. Each medication was verbally reviewed with the patient and she was encouraged to bring the bottles to every visit to confirm accuracy of list.  Return in 5 months or sooner for any questions/problems before then.

## 2018-07-20 NOTE — Patient Instructions (Signed)
Continue weighing daily and call for an overnight weight gain of > 2 pounds or a weekly weight gain of >5 pounds. 

## 2018-07-21 ENCOUNTER — Encounter: Payer: Self-pay | Admitting: Family

## 2018-07-21 ENCOUNTER — Telehealth: Payer: Self-pay

## 2018-07-21 DIAGNOSIS — G4719 Other hypersomnia: Secondary | ICD-10-CM

## 2018-07-21 DIAGNOSIS — G4733 Obstructive sleep apnea (adult) (pediatric): Secondary | ICD-10-CM

## 2018-07-21 NOTE — Telephone Encounter (Signed)
Spoke to patient regarding sleep study results.   Moderate OSA with AHI of 15. OSA seen in multiple positions including prone position.   Recommend auto-CPAP with pressure range of 5-20 cm H2O.  Patient aware, orders placed.

## 2018-07-24 DIAGNOSIS — J449 Chronic obstructive pulmonary disease, unspecified: Secondary | ICD-10-CM

## 2018-07-24 NOTE — Progress Notes (Signed)
Pulmonary Individual Treatment Plan  Patient Details  Name: Ashley Bird MRN: [id number] Date of Birth: [birth year] Referring Provider:     Pulmonary Rehab from 07/17/2018 in [facility] Cardiac and Pulmonary Rehab  Referring Provider  Merton Border MD      Initial Encounter Date:    Pulmonary Rehab from 07/17/2018 in [facility] Cardiac and Pulmonary Rehab  Date  07/17/18      Visit Diagnosis: Chronic obstructive pulmonary disease, unspecified COPD type ([place])  Patient's Home Medications on Admission:  Current Outpatient Medications:  .  acetaminophen (TYLENOL) 325 MG tablet, Take 325 mg by mouth every 6 (six) hours as needed., Disp: , Rfl:  .  ADVAIR DISKUS 100-50 MCG/DOSE AEPB, Inhale 1 puff into the lungs every 12 (twelve) hours., Disp: , Rfl: 3 .  albuterol (ACCUNEB) 1.25 MG/3ML nebulizer solution, Inhale into the lungs., Disp: , Rfl:  .  albuterol (PROVENTIL HFA;VENTOLIN HFA) 108 (90 Base) MCG/ACT inhaler, Inhale into the lungs., Disp: , Rfl:  .  amiodarone (PACERONE) 200 MG tablet, Take 200 mg by mouth daily., Disp: , Rfl: 1 .  b complex vitamins tablet, Take 1 tablet by mouth daily., Disp: , Rfl:  .  calcium carbonate (OSCAL) 1500 (600 Ca) MG TABS tablet, Take 600 mg of elemental calcium by mouth daily with breakfast., Disp: , Rfl:  .  cetirizine (ZYRTEC) 10 MG tablet, Take 10 mg by mouth daily., Disp: , Rfl:  .  Cholecalciferol (VITAMIN D3) 5000 units TABS, Take 1 tablet by mouth daily., Disp: , Rfl:  .  furosemide (LASIX) 20 MG tablet, Take 20 mg by mouth as needed. , Disp: , Rfl:  .  metoprolol succinate (TOPROL-XL) 50 MG 24 hr tablet, Take 50 mg by mouth daily., Disp: , Rfl:  .  Multiple Vitamins-Minerals (MULTIVITAMIN GUMMIES WOMENS) CHEW, Chew 1 tablet by mouth daily., Disp: , Rfl:  .  olmesartan-hydrochlorothiazide (BENICAR HCT) 40-25 MG tablet, Take 1 tablet by mouth daily., Disp: , Rfl: 0 .  traMADol (ULTRAM) 50 MG tablet, TAKE 1 TO 2 TABLETS BY MOUTH EVERY 4 TO 6 HOURS AS NEEDED,  Disp: , Rfl: 1 .  warfarin (COUMADIN) 2 MG tablet, Take 2 mg by mouth daily. On Monday, Disp: , Rfl: 0  Past Medical History: Past Medical History:  Diagnosis Date  . CAD (coronary artery disease)   . CHF (congestive heart failure) ([place])   . COPD (chronic obstructive pulmonary disease) ([place])   . Diabetes ([place])   . HLD (hyperlipidemia)   . HTN (hypertension)   . PAF (paroxysmal atrial fibrillation) ([facility])     Tobacco Use: Social History   Tobacco Use  Smoking Status Former Smoker  . Packs/day: 2.00  . Years: 30.00  . Pack years: 60.00  . Types: Cigarettes  . Last attempt to quit: 05/1979  . Years since quitting: 39.2  Smokeless Tobacco Never Used    Labs: Recent Chemical engineer    Labs for ITP Cardiac and Pulmonary Rehab Latest Ref Rng & Units 05/01/2018 05/02/2018   Hemoglobin A1c 4.8 - 5.6 % - 6.2(H)   PHART 7.350 - 7.450 - 7.40   PCO2ART 32.0 - 48.0 mmHg - 43   HCO3 20.0 - 28.0 mmol/L 27.2 26.6   ACIDBASEDEF 0.0 - 2.0 mmol/L 3.4(H) -   O2SAT % - 94.9       Pulmonary Assessment Scores: Pulmonary Assessment Scores    Row Name 07/17/18 1449         ADL UCSD   ADL  Phase  Entry     SOB Score total  58     Rest  1     Walk  3     Stairs  5     Bath  3     Dress  2     Shop  4       CAT Score   CAT Score  20       mMRC Score   mMRC Score  2        Pulmonary Function Assessment: Pulmonary Function Assessment - 07/17/18 1448      Initial Spirometry Results   FVC%  44 %    FEV1%  47 %    FEV1/FVC Ratio  79.46    Comments  Good patient effort      Post Bronchodilator Spirometry Results   FVC%  52.8 %    FEV1%  54.99 %    FEV1/FVC Ratio  77.63    Comments  Good patient effort      Breath   Bilateral Breath Sounds  Clear    Shortness of Breath  Yes;Panic with Shortness of Breath;Limiting activity       Exercise Target Goals: Exercise Program Goal: Individual exercise prescription set using results from initial 6 min walk test and THRR  while considering  patient's activity barriers and safety.   Exercise Prescription Goal: Initial exercise prescription builds to 30-45 minutes a day of aerobic activity, 2-3 days per week.  Home exercise guidelines will be given to patient during program as part of exercise prescription that the participant will acknowledge.  Activity Barriers & Risk Stratification: Activity Barriers & Cardiac Risk Stratification - 07/17/18 1602      Activity Barriers & Cardiac Risk Stratification   Activity Barriers  Back Problems;Arthritis;Joint Problems;Deconditioning;Muscular Weakness;Shortness of Breath;Balance Concerns;History of Falls;Assistive Device   arthritis in right knee, chronic hip and back pain      6 Minute Walk: 6 Minute Walk    Row Name 07/17/18 1556         6 Minute Walk   Phase  Initial     Distance  525 feet     Walk Time  4.7 minutes     # of Rest Breaks  2 18 sec, 1 min     MPH  1.57     METS  0.73     RPE  19     Perceived Dyspnea   3     VO2 Peak  2.57     Symptoms  Yes (comment)     Comments  back pain 10/10; nausea 5/10; feels wobbly and afraid of falling     Resting HR  70 bpm     Resting BP  134/66     Resting Oxygen Saturation   96 %     Exercise Oxygen Saturation  during 6 min walk  92 %     Max Ex. HR  110 bpm     Max Ex. BP  154/72     2 Minute Post BP  142/74       Interval HR   1 Minute HR  83     2 Minute HR  107     3 Minute HR  102     4 Minute HR  110     5 Minute HR  93     6 Minute HR  105     2 Minute Post HR  84  Interval Heart Rate?  Yes       Interval Oxygen   Interval Oxygen?  Yes     Baseline Oxygen Saturation %  96 %     1 Minute Oxygen Saturation %  92 %     1 Minute Liters of Oxygen  0 L Room Air     2 Minute Oxygen Saturation %  93 %     2 Minute Liters of Oxygen  0 L     3 Minute Oxygen Saturation %  94 %     3 Minute Liters of Oxygen  0 L     4 Minute Oxygen Saturation %  93 %     4 Minute Liters of Oxygen  0 L     5  Minute Oxygen Saturation %  98 %     5 Minute Liters of Oxygen  0 L     6 Minute Oxygen Saturation %  94 %     6 Minute Liters of Oxygen  0 L     2 Minute Post Oxygen Saturation %  95 %     2 Minute Post Liters of Oxygen  0 L       Oxygen Initial Assessment: Oxygen Initial Assessment - 07/17/18 1448      Home Oxygen   Home Oxygen Device  None    Sleep Oxygen Prescription  None    Home Exercise Oxygen Prescription  None    Home at Rest Exercise Oxygen Prescription  None      Initial 6 min Walk   Oxygen Used  None      Program Oxygen Prescription   Program Oxygen Prescription  None      Intervention   Short Term Goals  To learn and exhibit compliance with exercise, home and travel O2 prescription;To learn and understand importance of monitoring SPO2 with pulse oximeter and demonstrate accurate use of the pulse oximeter.;To learn and understand importance of maintaining oxygen saturations>88%;To learn and demonstrate proper pursed lip breathing techniques or other breathing techniques.;To learn and demonstrate proper use of respiratory medications    Long  Term Goals  Exhibits compliance with exercise, home and travel O2 prescription;Verbalizes importance of monitoring SPO2 with pulse oximeter and return demonstration;Maintenance of O2 saturations>88%;Exhibits proper breathing techniques, such as pursed lip breathing or other method taught during program session;Compliance with respiratory medication;Demonstrates proper use of MDI's       Oxygen Re-Evaluation:   Oxygen Discharge (Final Oxygen Re-Evaluation):   Initial Exercise Prescription: Initial Exercise Prescription - 07/17/18 1600      Date of Initial Exercise RX and Referring Provider   Date  07/17/18    Referring Provider  Merton Border MD      Treadmill   MPH  1    Grade  0.5    Minutes  15    METs  1.83      NuStep   Level  1    SPM  80    Minutes  15    METs  1.8      Biostep-RELP   Level  1    SPM   50    Minutes  15    METs  2      Prescription Details   Frequency (times per week)  3    Duration  Progress to 45 minutes of aerobic exercise without signs/symptoms of physical distress      Intensity   THRR 40-80% of Max Heartrate  98-126  Ratings of Perceived Exertion  11-13    Perceived Dyspnea  0-4      Progression   Progression  Continue to progress workloads to maintain intensity without signs/symptoms of physical distress.      Resistance Training   Training Prescription  Yes    Weight  3 lbs    Reps  10-15       Perform Capillary Blood Glucose checks as needed.  Exercise Prescription Changes: Exercise Prescription Changes    Row Name 07/17/18 1600             Response to Exercise   Blood Pressure (Admit)  134/66       Blood Pressure (Exercise)  154/72       Blood Pressure (Exit)  142/74       Heart Rate (Admit)  70 bpm       Heart Rate (Exercise)  110 bpm       Heart Rate (Exit)  84 bpm       Oxygen Saturation (Admit)  96 %       Oxygen Saturation (Exercise)  92 %       Oxygen Saturation (Exit)  95 %       Rating of Perceived Exertion (Exercise)  19       Perceived Dyspnea (Exercise)  3       Symptoms  back pain 10/10; nausea 5/10; feels wobbly and afraid to fall       Comments  walk test results          Exercise Comments:   Exercise Goals and Review: Exercise Goals    Row Name 07/17/18 1605             Exercise Goals   Increase Physical Activity  Yes       Intervention  Provide advice, education, support and counseling about physical activity/exercise needs.;Develop an individualized exercise prescription for aerobic and resistive training based on initial evaluation findings, risk stratification, comorbidities and participant's personal goals.       Expected Outcomes  Short Term: Attend rehab on a regular basis to increase amount of physical activity.;Long Term: Add in home exercise to make exercise part of routine and to increase amount  of physical activity.;Long Term: Exercising regularly at least 3-5 days a week.       Increase Strength and Stamina  Yes       Intervention  Provide advice, education, support and counseling about physical activity/exercise needs.;Develop an individualized exercise prescription for aerobic and resistive training based on initial evaluation findings, risk stratification, comorbidities and participant's personal goals.       Expected Outcomes  Short Term: Increase workloads from initial exercise prescription for resistance, speed, and METs.;Short Term: Perform resistance training exercises routinely during rehab and add in resistance training at home;Long Term: Improve cardiorespiratory fitness, muscular endurance and strength as measured by increased METs and functional capacity (6MWT)       Able to understand and use rate of perceived exertion (RPE) scale  Yes       Intervention  Provide education and explanation on how to use RPE scale       Expected Outcomes  Short Term: Able to use RPE daily in rehab to express subjective intensity level;Long Term:  Able to use RPE to guide intensity level when exercising independently       Able to understand and use Dyspnea scale  Yes       Intervention  Provide education and explanation  on how to use Dyspnea scale       Expected Outcomes  Long Term: Able to use Dyspnea scale to guide intensity level when exercising independently;Short Term: Able to use Dyspnea scale daily in rehab to express subjective sense of shortness of breath during exertion       Knowledge and understanding of Target Heart Rate Range (THRR)  Yes       Intervention  Provide education and explanation of THRR including how the numbers were predicted and where they are located for reference       Expected Outcomes  Short Term: Able to state/look up THRR;Short Term: Able to use daily as guideline for intensity in rehab;Long Term: Able to use THRR to govern intensity when exercising independently        Able to check pulse independently  Yes       Intervention  Provide education and demonstration on how to check pulse in carotid and radial arteries.;Review the importance of being able to check your own pulse for safety during independent exercise       Expected Outcomes  Short Term: Able to explain why pulse checking is important during independent exercise;Long Term: Able to check pulse independently and accurately       Understanding of Exercise Prescription  Yes       Intervention  Provide education, explanation, and written materials on patient's individual exercise prescription       Expected Outcomes  Short Term: Able to explain program exercise prescription;Long Term: Able to explain home exercise prescription to exercise independently          Exercise Goals Re-Evaluation :   Discharge Exercise Prescription (Final Exercise Prescription Changes): Exercise Prescription Changes - 07/17/18 1600      Response to Exercise   Blood Pressure (Admit)  134/66    Blood Pressure (Exercise)  154/72    Blood Pressure (Exit)  142/74    Heart Rate (Admit)  70 bpm    Heart Rate (Exercise)  110 bpm    Heart Rate (Exit)  84 bpm    Oxygen Saturation (Admit)  96 %    Oxygen Saturation (Exercise)  92 %    Oxygen Saturation (Exit)  95 %    Rating of Perceived Exertion (Exercise)  19    Perceived Dyspnea (Exercise)  3    Symptoms  back pain 10/10; nausea 5/10; feels wobbly and afraid to fall    Comments  walk test results       Nutrition:  Target Goals: Understanding of nutrition guidelines, daily intake of sodium <1561m, cholesterol <2024m calories 30% from fat and 7% or less from saturated fats, daily to have 5 or more servings of fruits and vegetables.  Biometrics: Pre Biometrics - 07/17/18 1606      Pre Biometrics   Height  5' 0.5" (1.537 m)    Weight  189 lb 11.2 oz (86 kg)    Waist Circumference  41 inches    Hip Circumference  48 inches    Waist to Hip Ratio  0.85 %    BMI  (Calculated)  36.42    Single Leg Stand  1.46 seconds        Nutrition Therapy Plan and Nutrition Goals: Nutrition Therapy & Goals - 07/17/18 1454      Personal Nutrition Goals   Nutrition Goal  Learn to eat healthier    Personal Goal #2  Lose weight      Intervention Plan   Intervention  Prescribe, educate and counsel regarding individualized specific dietary modifications aiming towards targeted core components such as weight, hypertension, lipid management, diabetes, heart failure and other comorbidities.;Nutrition handout(s) given to patient.    Expected Outcomes  Short Term Goal: Understand basic principles of dietary content, such as calories, fat, sodium, cholesterol and nutrients.;Long Term Goal: Adherence to prescribed nutrition plan.       Nutrition Assessments: Nutrition Assessments - 07/17/18 1543      MEDFICTS Scores   Pre Score  3       Nutrition Goals Re-Evaluation:   Nutrition Goals Discharge (Final Nutrition Goals Re-Evaluation):   Psychosocial: Target Goals: Acknowledge presence or absence of significant depression and/or stress, maximize coping skills, provide positive support system. Participant is able to verbalize types and ability to use techniques and skills needed for reducing stress and depression.   Initial Review & Psychosocial Screening: Initial Psych Review & Screening - 07/17/18 1452      Initial Review   Current issues with  Current Sleep Concerns      Family Dynamics   Good Support System?  Yes    Comments  Her daughter is her support system. She has a positive outlook.      Barriers   Psychosocial barriers to participate in program  There are no identifiable barriers or psychosocial needs.;The patient should benefit from training in stress management and relaxation.      Screening Interventions   Interventions  Encouraged to exercise;Program counselor consult;To provide support and resources with identified psychosocial needs;Provide  feedback about the scores to participant    Expected Outcomes  Long Term Goal: Stressors or current issues are controlled or eliminated.;Short Term goal: Utilizing psychosocial counselor, staff and physician to assist with identification of specific Stressors or current issues interfering with healing process. Setting desired goal for each stressor or current issue identified.;Short Term goal: Identification and review with participant of any Quality of Life or Depression concerns found by scoring the questionnaire.;Long Term goal: The participant improves quality of Life and PHQ9 Scores as seen by post scores and/or verbalization of changes       Quality of Life Scores:  Scores of 19 and below usually indicate a poorer quality of life in these areas.  A difference of  2-3 points is a clinically meaningful difference.  A difference of 2-3 points in the total score of the Quality of Life Index has been associated with significant improvement in overall quality of life, self-image, physical symptoms, and general health in studies assessing change in quality of life.  PHQ-9: Recent Review Flowsheet Data    Depression screen [facility] 2/9 07/17/2018 05/10/2018   Decreased Interest 0 1   Down, Depressed, Hopeless 0 0   PHQ - 2 Score 0 1   Altered sleeping 2 0   Tired, decreased energy 2 1   Change in appetite 0 0   Feeling bad or failure about yourself  0 0   Trouble concentrating 0 1   Moving slowly or fidgety/restless 0 0   Suicidal thoughts 0 0   PHQ-9 Score 4 3   Difficult doing work/chores Somewhat difficult Somewhat difficult     Interpretation of Total Score  Total Score Depression Severity:  1-4 = Minimal depression, 5-9 = Mild depression, 10-14 = Moderate depression, 15-19 = Moderately severe depression, 20-27 = Severe depression   Psychosocial Evaluation and Intervention:   Psychosocial Re-Evaluation:   Psychosocial Discharge (Final Psychosocial  Re-Evaluation):   Education: Education Goals: Education classes will  be provided on a weekly basis, covering required topics. Participant will state understanding/return demonstration of topics presented.  Learning Barriers/Preferences: Learning Barriers/Preferences - 07/17/18 1455      Learning Barriers/Preferences   Learning Barriers  Hearing;Sight   wears glasses   Learning Preferences  None       Education Topics:  Initial Evaluation Education: - Verbal, written and demonstration of respiratory meds, oximetry and breathing techniques. Instruction on use of nebulizers and MDIs and importance of monitoring MDI activations.   Pulmonary Rehab from 07/17/2018 in [facility] Cardiac and Pulmonary Rehab  Date  07/17/18  Educator  [facility]  Instruction Review Code  1- Verbalizes Understanding      General Nutrition Guidelines/Fats and Fiber: -Group instruction provided by verbal, written material, models and posters to present the general guidelines for heart healthy nutrition. Gives an explanation and review of dietary fats and fiber.   Controlling Sodium/Reading Food Labels: -Group verbal and written material supporting the discussion of sodium use in heart healthy nutrition. Review and explanation with models, verbal and written materials for utilization of the food label.   Exercise Physiology & General Exercise Guidelines: - Group verbal and written instruction with models to review the exercise physiology of the cardiovascular system and associated critical values. Provides general exercise guidelines with specific guidelines to those with heart or lung disease.    Aerobic Exercise & Resistance Training: - Gives group verbal and written instruction on the various components of exercise. Focuses on aerobic and resistive training programs and the benefits of this training and how to safely progress through these programs.   Flexibility, Balance, Mind/Body Relaxation: Provides group  verbal/written instruction on the benefits of flexibility and balance training, including mind/body exercise modes such as yoga, pilates and tai chi.  Demonstration and skill practice provided.   Stress and Anxiety: - Provides group verbal and written instruction about the health risks of elevated stress and causes of high stress.  Discuss the correlation between heart/lung disease and anxiety and treatment options. Review healthy ways to manage with stress and anxiety.   Depression: - Provides group verbal and written instruction on the correlation between heart/lung disease and depressed mood, treatment options, and the stigmas associated with seeking treatment.   Exercise & Equipment Safety: - Individual verbal instruction and demonstration of equipment use and safety with use of the equipment.   Pulmonary Rehab from 07/17/2018 in [facility] Cardiac and Pulmonary Rehab  Date  07/17/18  Educator  [facility]  Instruction Review Code  1- Verbalizes Understanding      Infection Prevention: - Provides verbal and written material to individual with discussion of infection control including proper hand washing and proper equipment cleaning during exercise session.   Pulmonary Rehab from 07/17/2018 in [facility] Cardiac and Pulmonary Rehab  Date  07/17/18  Educator  [facility]  Instruction Review Code  1- Verbalizes Understanding      Falls Prevention: - Provides verbal and written material to individual with discussion of falls prevention and safety.   Pulmonary Rehab from 07/17/2018 in [facility] Cardiac and Pulmonary Rehab  Date  07/17/18  Educator  [facility]  Instruction Review Code  1- Verbalizes Understanding      Diabetes: - Individual verbal and written instruction to review signs/symptoms of diabetes, desired ranges of glucose level fasting, after meals and with exercise. Advice that pre and post exercise glucose checks will be done for 3 sessions at entry of program.   Pulmonary Rehab from 07/17/2018 in [facility] Cardiac and  Pulmonary Rehab  Date  07/17/18  Educator  [facility]  Instruction Review Code  1- Verbalizes Understanding      Chronic Lung Diseases: - Group verbal and written instruction to review updates, respiratory medications, advancements in procedures and treatments. Discuss use of supplemental oxygen including available portable oxygen systems, continuous and intermittent flow rates, concentrators, personal use and safety guidelines. Review proper use of inhaler and spacers. Provide informative websites for self-education.    Energy Conservation: - Provide group verbal and written instruction for methods to conserve energy, plan and organize activities. Instruct on pacing techniques, use of adaptive equipment and posture/positioning to relieve shortness of breath.   Triggers and Exacerbations: - Group verbal and written instruction to review types of environmental triggers and ways to prevent exacerbations. Discuss weather changes, air quality and the benefits of nasal washing. Review warning signs and symptoms to help prevent infections. Discuss techniques for effective airway clearance, coughing, and vibrations.   AED/CPR: - Group verbal and written instruction with the use of models to demonstrate the basic use of the AED with the basic ABC's of resuscitation.   Anatomy and Physiology of the Lungs: - Group verbal and written instruction with the use of models to provide basic lung anatomy and physiology related to function, structure and complications of lung disease.   Anatomy & Physiology of the Heart: - Group verbal and written instruction and models provide basic cardiac anatomy and physiology, with the coronary electrical and arterial systems. Review of Valvular disease and Heart Failure   Cardiac Medications: - Group verbal and written instruction to review commonly prescribed medications for heart disease. Reviews the medication, class of the drug, and side effects.   Know Your  Numbers and Risk Factors: -Group verbal and written instruction about important numbers in your health.  Discussion of what are risk factors and how they play a role in the disease process.  Review of Cholesterol, Blood Pressure, Diabetes, and BMI and the role they play in your overall health.   Sleep Hygiene: -Provides group verbal and written instruction about how sleep can affect your health.  Define sleep hygiene, discuss sleep cycles and impact of sleep habits. Review good sleep hygiene tips.    Other: -Provides group and verbal instruction on various topics (see comments)    Knowledge Questionnaire Score: Knowledge Questionnaire Score - 07/17/18 1455      Knowledge Questionnaire Score   Pre Score  15/18   reviewed with patient       Core Components/Risk Factors/Patient Goals at Admission: Personal Goals and Risk Factors at Admission - 07/17/18 1455      Core Components/Risk Factors/Patient Goals on Admission    Weight Management  Yes;Obesity;Weight Loss    Intervention  Weight Management: Develop a combined nutrition and exercise program designed to reach desired caloric intake, while maintaining appropriate intake of nutrient and fiber, sodium and fats, and appropriate energy expenditure required for the weight goal.;Weight Management: Provide education and appropriate resources to help participant work on and attain dietary goals.;Obesity: Provide education and appropriate resources to help participant work on and attain dietary goals.    Admit Weight  189 lb (85.7 kg)    Goal Weight: Short Term  184 lb (83.5 kg)    Goal Weight: Long Term  150 lb (68 kg)    Expected Outcomes  Short Term: Continue to assess and modify interventions until short term weight is achieved;Long Term: Adherence to nutrition and physical activity/exercise program aimed toward attainment of established weight goal;Weight Maintenance:  Understanding of the daily nutrition guidelines, which includes 25-35%  calories from fat, 7% or less cal from saturated fats, less than '200mg'$  cholesterol, less than 1.5gm of sodium, & 5 or more servings of fruits and vegetables daily;Weight Loss: Understanding of general recommendations for a balanced deficit meal plan, which promotes 1-2 lb weight loss per week and includes a negative energy balance of [phone] kcal/d;Understanding recommendations for meals to include 15-35% energy as protein, 25-35% energy from fat, 35-60% energy from carbohydrates, less than '200mg'$  of dietary cholesterol, 20-35 gm of total fiber daily;Understanding of distribution of calorie intake throughout the day with the consumption of 4-5 meals/snacks    Improve shortness of breath with ADL's  Yes    Intervention  Provide education, individualized exercise plan and daily activity instruction to help decrease symptoms of SOB with activities of daily living.    Expected Outcomes  Short Term: Improve cardiorespiratory fitness to achieve a reduction of symptoms when performing ADLs;Long Term: Be able to perform more ADLs without symptoms or delay the onset of symptoms    Diabetes  Yes    Intervention  Provide education about signs/symptoms and action to take for hypo/hyperglycemia.;Provide education about proper nutrition, including hydration, and aerobic/resistive exercise prescription along with prescribed medications to achieve blood glucose in normal ranges: Fasting glucose 65-99 mg/dL    Expected Outcomes  Short Term: Participant verbalizes understanding of the signs/symptoms and immediate care of hyper/hypoglycemia, proper foot care and importance of medication, aerobic/resistive exercise and nutrition plan for blood glucose control.;Long Term: Attainment of HbA1C < 7%.    Heart Failure  Yes   AFID   Intervention  Provide a combined exercise and nutrition program that is supplemented with education, support and counseling about heart failure. Directed toward relieving symptoms such as shortness of  breath, decreased exercise tolerance, and extremity edema.    Expected Outcomes  Improve functional capacity of life;Short term: Attendance in program 2-3 days a week with increased exercise capacity. Reported lower sodium intake. Reported increased fruit and vegetable intake. Reports medication compliance.;Short term: Daily weights obtained and reported for increase. Utilizing diuretic protocols set by physician.;Long term: Adoption of self-care skills and reduction of barriers for early signs and symptoms recognition and intervention leading to self-care maintenance.    Hypertension  Yes    Intervention  Provide education on lifestyle modifcations including regular physical activity/exercise, weight management, moderate sodium restriction and increased consumption of fresh fruit, vegetables, and low fat dairy, alcohol moderation, and smoking cessation.;Monitor prescription use compliance.    Expected Outcomes  Short Term: Continued assessment and intervention until BP is < 140/53m HG in hypertensive participants. < 130/855mHG in hypertensive participants with diabetes, heart failure or chronic kidney disease.;Long Term: Maintenance of blood pressure at goal levels.       Core Components/Risk Factors/Patient Goals Review:    Core Components/Risk Factors/Patient Goals at Discharge (Final Review):    ITP Comments: ITP Comments    Row Name 07/17/18 1523 07/18/18 1006 07/24/18 0850       ITP Comments  Medical Evaluation completed. Chart sent for review and changes to Dr. MaEmily Filbertirector of [facility] can be found in [facility] encounter 05/18/18  Patients states that her insurance is changing this month and will not be able to start [facility] until the third week of February. Informed patient to call back in February for a definitive start date.  30 day review completed. ITP sent to Dr. MaEmily Filbertirector [place]  unless changes are made by physician.         Comments: 30 day review

## 2018-07-26 ENCOUNTER — Ambulatory Visit: Payer: PRIVATE HEALTH INSURANCE

## 2018-07-28 ENCOUNTER — Ambulatory Visit: Payer: PRIVATE HEALTH INSURANCE

## 2018-07-31 ENCOUNTER — Ambulatory Visit: Payer: PRIVATE HEALTH INSURANCE

## 2018-08-02 ENCOUNTER — Ambulatory Visit: Payer: PRIVATE HEALTH INSURANCE

## 2018-08-04 ENCOUNTER — Ambulatory Visit: Payer: PRIVATE HEALTH INSURANCE

## 2018-08-07 ENCOUNTER — Ambulatory Visit: Payer: PRIVATE HEALTH INSURANCE

## 2018-08-08 ENCOUNTER — Telehealth: Payer: Self-pay

## 2018-08-08 DIAGNOSIS — J449 Chronic obstructive pulmonary disease, unspecified: Secondary | ICD-10-CM

## 2018-08-08 NOTE — Telephone Encounter (Signed)
Called patient to make sure of her start date. Patient will start [place] on 08/25/2018.

## 2018-08-09 ENCOUNTER — Ambulatory Visit: Payer: PRIVATE HEALTH INSURANCE

## 2018-08-09 ENCOUNTER — Telehealth: Payer: Self-pay

## 2018-08-09 DIAGNOSIS — J449 Chronic obstructive pulmonary disease, unspecified: Secondary | ICD-10-CM

## 2018-08-09 NOTE — Progress Notes (Signed)
Pulmonary Individual Treatment Plan  Patient Details  Name: Ashley Bird MRN: [id number] Date of Birth: [birth year] Referring Provider:     Pulmonary Rehab from 07/17/2018 in [facility] Cardiac and Pulmonary Rehab  Referring Provider  Merton Border MD      Initial Encounter Date:    Pulmonary Rehab from 07/17/2018 in [facility] Cardiac and Pulmonary Rehab  Date  07/17/18      Visit Diagnosis: Chronic obstructive pulmonary disease, unspecified COPD type ([place])  Patient's Home Medications on Admission:  Current Outpatient Medications:  .  acetaminophen (TYLENOL) 325 MG tablet, Take 325 mg by mouth every 6 (six) hours as needed., Disp: , Rfl:  .  ADVAIR DISKUS 100-50 MCG/DOSE AEPB, Inhale 1 puff into the lungs every 12 (twelve) hours., Disp: , Rfl: 3 .  albuterol (ACCUNEB) 1.25 MG/3ML nebulizer solution, Inhale into the lungs., Disp: , Rfl:  .  albuterol (PROVENTIL HFA;VENTOLIN HFA) 108 (90 Base) MCG/ACT inhaler, Inhale into the lungs., Disp: , Rfl:  .  amiodarone (PACERONE) 200 MG tablet, Take 200 mg by mouth daily., Disp: , Rfl: 1 .  b complex vitamins tablet, Take 1 tablet by mouth daily., Disp: , Rfl:  .  calcium carbonate (OSCAL) 1500 (600 Ca) MG TABS tablet, Take 600 mg of elemental calcium by mouth daily with breakfast., Disp: , Rfl:  .  cetirizine (ZYRTEC) 10 MG tablet, Take 10 mg by mouth daily., Disp: , Rfl:  .  Cholecalciferol (VITAMIN D3) 5000 units TABS, Take 1 tablet by mouth daily., Disp: , Rfl:  .  furosemide (LASIX) 20 MG tablet, Take 20 mg by mouth as needed. , Disp: , Rfl:  .  metoprolol succinate (TOPROL-XL) 50 MG 24 hr tablet, Take 50 mg by mouth daily., Disp: , Rfl:  .  Multiple Vitamins-Minerals (MULTIVITAMIN GUMMIES WOMENS) CHEW, Chew 1 tablet by mouth daily., Disp: , Rfl:  .  olmesartan-hydrochlorothiazide (BENICAR HCT) 40-25 MG tablet, Take 1 tablet by mouth daily., Disp: , Rfl: 0 .  traMADol (ULTRAM) 50 MG tablet, TAKE 1 TO 2 TABLETS BY MOUTH EVERY 4 TO 6 HOURS AS NEEDED,  Disp: , Rfl: 1 .  warfarin (COUMADIN) 2 MG tablet, Take 2 mg by mouth daily. On Monday, Disp: , Rfl: 0  Past Medical History: Past Medical History:  Diagnosis Date  . CAD (coronary artery disease)   . CHF (congestive heart failure) ([place])   . COPD (chronic obstructive pulmonary disease) ([place])   . Diabetes ([place])   . HLD (hyperlipidemia)   . HTN (hypertension)   . PAF (paroxysmal atrial fibrillation) ([facility])     Tobacco Use: Social History   Tobacco Use  Smoking Status Former Smoker  . Packs/day: 2.00  . Years: 30.00  . Pack years: 60.00  . Types: Cigarettes  . Last attempt to quit: 05/1979  . Years since quitting: 39.2  Smokeless Tobacco Never Used    Labs: Recent Chemical engineer    Labs for ITP Cardiac and Pulmonary Rehab Latest Ref Rng & Units 05/01/2018 05/02/2018   Hemoglobin A1c 4.8 - 5.6 % - 6.2(H)   PHART 7.350 - 7.450 - 7.40   PCO2ART 32.0 - 48.0 mmHg - 43   HCO3 20.0 - 28.0 mmol/L 27.2 26.6   ACIDBASEDEF 0.0 - 2.0 mmol/L 3.4(H) -   O2SAT % - 94.9       Pulmonary Assessment Scores: Pulmonary Assessment Scores    Row Name 07/17/18 1449         ADL UCSD   ADL  Phase  Entry     SOB Score total  58     Rest  1     Walk  3     Stairs  5     Bath  3     Dress  2     Shop  4       CAT Score   CAT Score  20       mMRC Score   mMRC Score  2        Pulmonary Function Assessment: Pulmonary Function Assessment - 07/17/18 1448      Initial Spirometry Results   FVC%  44 %    FEV1%  47 %    FEV1/FVC Ratio  79.46    Comments  Good patient effort      Post Bronchodilator Spirometry Results   FVC%  52.8 %    FEV1%  54.99 %    FEV1/FVC Ratio  77.63    Comments  Good patient effort      Breath   Bilateral Breath Sounds  Clear    Shortness of Breath  Yes;Panic with Shortness of Breath;Limiting activity       Exercise Target Goals: Exercise Program Goal: Individual exercise prescription set using results from initial 6 min walk test and THRR  while considering  patient's activity barriers and safety.   Exercise Prescription Goal: Initial exercise prescription builds to 30-45 minutes a day of aerobic activity, 2-3 days per week.  Home exercise guidelines will be given to patient during program as part of exercise prescription that the participant will acknowledge.  Activity Barriers & Risk Stratification: Activity Barriers & Cardiac Risk Stratification - 07/17/18 1602      Activity Barriers & Cardiac Risk Stratification   Activity Barriers  Back Problems;Arthritis;Joint Problems;Deconditioning;Muscular Weakness;Shortness of Breath;Balance Concerns;History of Falls;Assistive Device   arthritis in right knee, chronic hip and back pain      6 Minute Walk: 6 Minute Walk    Row Name 07/17/18 1556         6 Minute Walk   Phase  Initial     Distance  525 feet     Walk Time  4.7 minutes     # of Rest Breaks  2 18 sec, 1 min     MPH  1.57     METS  0.73     RPE  19     Perceived Dyspnea   3     VO2 Peak  2.57     Symptoms  Yes (comment)     Comments  back pain 10/10; nausea 5/10; feels wobbly and afraid of falling     Resting HR  70 bpm     Resting BP  134/66     Resting Oxygen Saturation   96 %     Exercise Oxygen Saturation  during 6 min walk  92 %     Max Ex. HR  110 bpm     Max Ex. BP  154/72     2 Minute Post BP  142/74       Interval HR   1 Minute HR  83     2 Minute HR  107     3 Minute HR  102     4 Minute HR  110     5 Minute HR  93     6 Minute HR  105     2 Minute Post HR  84  Interval Heart Rate?  Yes       Interval Oxygen   Interval Oxygen?  Yes     Baseline Oxygen Saturation %  96 %     1 Minute Oxygen Saturation %  92 %     1 Minute Liters of Oxygen  0 L Room Air     2 Minute Oxygen Saturation %  93 %     2 Minute Liters of Oxygen  0 L     3 Minute Oxygen Saturation %  94 %     3 Minute Liters of Oxygen  0 L     4 Minute Oxygen Saturation %  93 %     4 Minute Liters of Oxygen  0 L     5  Minute Oxygen Saturation %  98 %     5 Minute Liters of Oxygen  0 L     6 Minute Oxygen Saturation %  94 %     6 Minute Liters of Oxygen  0 L     2 Minute Post Oxygen Saturation %  95 %     2 Minute Post Liters of Oxygen  0 L       Oxygen Initial Assessment: Oxygen Initial Assessment - 07/17/18 1448      Home Oxygen   Home Oxygen Device  None    Sleep Oxygen Prescription  None    Home Exercise Oxygen Prescription  None    Home at Rest Exercise Oxygen Prescription  None      Initial 6 min Walk   Oxygen Used  None      Program Oxygen Prescription   Program Oxygen Prescription  None      Intervention   Short Term Goals  To learn and exhibit compliance with exercise, home and travel O2 prescription;To learn and understand importance of monitoring SPO2 with pulse oximeter and demonstrate accurate use of the pulse oximeter.;To learn and understand importance of maintaining oxygen saturations>88%;To learn and demonstrate proper pursed lip breathing techniques or other breathing techniques.;To learn and demonstrate proper use of respiratory medications    Long  Term Goals  Exhibits compliance with exercise, home and travel O2 prescription;Verbalizes importance of monitoring SPO2 with pulse oximeter and return demonstration;Maintenance of O2 saturations>88%;Exhibits proper breathing techniques, such as pursed lip breathing or other method taught during program session;Compliance with respiratory medication;Demonstrates proper use of MDI's       Oxygen Re-Evaluation:   Oxygen Discharge (Final Oxygen Re-Evaluation):   Initial Exercise Prescription: Initial Exercise Prescription - 07/17/18 1600      Date of Initial Exercise RX and Referring Provider   Date  07/17/18    Referring Provider  Merton Border MD      Treadmill   MPH  1    Grade  0.5    Minutes  15    METs  1.83      NuStep   Level  1    SPM  80    Minutes  15    METs  1.8      Biostep-RELP   Level  1    SPM   50    Minutes  15    METs  2      Prescription Details   Frequency (times per week)  3    Duration  Progress to 45 minutes of aerobic exercise without signs/symptoms of physical distress      Intensity   THRR 40-80% of Max Heartrate  98-126  Ratings of Perceived Exertion  11-13    Perceived Dyspnea  0-4      Progression   Progression  Continue to progress workloads to maintain intensity without signs/symptoms of physical distress.      Resistance Training   Training Prescription  Yes    Weight  3 lbs    Reps  10-15       Perform Capillary Blood Glucose checks as needed.  Exercise Prescription Changes: Exercise Prescription Changes    Row Name 07/17/18 1600             Response to Exercise   Blood Pressure (Admit)  134/66       Blood Pressure (Exercise)  154/72       Blood Pressure (Exit)  142/74       Heart Rate (Admit)  70 bpm       Heart Rate (Exercise)  110 bpm       Heart Rate (Exit)  84 bpm       Oxygen Saturation (Admit)  96 %       Oxygen Saturation (Exercise)  92 %       Oxygen Saturation (Exit)  95 %       Rating of Perceived Exertion (Exercise)  19       Perceived Dyspnea (Exercise)  3       Symptoms  back pain 10/10; nausea 5/10; feels wobbly and afraid to fall       Comments  walk test results          Exercise Comments:   Exercise Goals and Review: Exercise Goals    Row Name 07/17/18 1605             Exercise Goals   Increase Physical Activity  Yes       Intervention  Provide advice, education, support and counseling about physical activity/exercise needs.;Develop an individualized exercise prescription for aerobic and resistive training based on initial evaluation findings, risk stratification, comorbidities and participant's personal goals.       Expected Outcomes  Short Term: Attend rehab on a regular basis to increase amount of physical activity.;Long Term: Add in home exercise to make exercise part of routine and to increase amount  of physical activity.;Long Term: Exercising regularly at least 3-5 days a week.       Increase Strength and Stamina  Yes       Intervention  Provide advice, education, support and counseling about physical activity/exercise needs.;Develop an individualized exercise prescription for aerobic and resistive training based on initial evaluation findings, risk stratification, comorbidities and participant's personal goals.       Expected Outcomes  Short Term: Increase workloads from initial exercise prescription for resistance, speed, and METs.;Short Term: Perform resistance training exercises routinely during rehab and add in resistance training at home;Long Term: Improve cardiorespiratory fitness, muscular endurance and strength as measured by increased METs and functional capacity (6MWT)       Able to understand and use rate of perceived exertion (RPE) scale  Yes       Intervention  Provide education and explanation on how to use RPE scale       Expected Outcomes  Short Term: Able to use RPE daily in rehab to express subjective intensity level;Long Term:  Able to use RPE to guide intensity level when exercising independently       Able to understand and use Dyspnea scale  Yes       Intervention  Provide education and explanation  on how to use Dyspnea scale       Expected Outcomes  Long Term: Able to use Dyspnea scale to guide intensity level when exercising independently;Short Term: Able to use Dyspnea scale daily in rehab to express subjective sense of shortness of breath during exertion       Knowledge and understanding of Target Heart Rate Range (THRR)  Yes       Intervention  Provide education and explanation of THRR including how the numbers were predicted and where they are located for reference       Expected Outcomes  Short Term: Able to state/look up THRR;Short Term: Able to use daily as guideline for intensity in rehab;Long Term: Able to use THRR to govern intensity when exercising independently        Able to check pulse independently  Yes       Intervention  Provide education and demonstration on how to check pulse in carotid and radial arteries.;Review the importance of being able to check your own pulse for safety during independent exercise       Expected Outcomes  Short Term: Able to explain why pulse checking is important during independent exercise;Long Term: Able to check pulse independently and accurately       Understanding of Exercise Prescription  Yes       Intervention  Provide education, explanation, and written materials on patient's individual exercise prescription       Expected Outcomes  Short Term: Able to explain program exercise prescription;Long Term: Able to explain home exercise prescription to exercise independently          Exercise Goals Re-Evaluation :   Discharge Exercise Prescription (Final Exercise Prescription Changes): Exercise Prescription Changes - 07/17/18 1600      Response to Exercise   Blood Pressure (Admit)  134/66    Blood Pressure (Exercise)  154/72    Blood Pressure (Exit)  142/74    Heart Rate (Admit)  70 bpm    Heart Rate (Exercise)  110 bpm    Heart Rate (Exit)  84 bpm    Oxygen Saturation (Admit)  96 %    Oxygen Saturation (Exercise)  92 %    Oxygen Saturation (Exit)  95 %    Rating of Perceived Exertion (Exercise)  19    Perceived Dyspnea (Exercise)  3    Symptoms  back pain 10/10; nausea 5/10; feels wobbly and afraid to fall    Comments  walk test results       Nutrition:  Target Goals: Understanding of nutrition guidelines, daily intake of sodium <1561m, cholesterol <2024m calories 30% from fat and 7% or less from saturated fats, daily to have 5 or more servings of fruits and vegetables.  Biometrics: Pre Biometrics - 07/17/18 1606      Pre Biometrics   Height  5' 0.5" (1.537 m)    Weight  189 lb 11.2 oz (86 kg)    Waist Circumference  41 inches    Hip Circumference  48 inches    Waist to Hip Ratio  0.85 %    BMI  (Calculated)  36.42    Single Leg Stand  1.46 seconds        Nutrition Therapy Plan and Nutrition Goals: Nutrition Therapy & Goals - 07/17/18 1454      Personal Nutrition Goals   Nutrition Goal  Learn to eat healthier    Personal Goal #2  Lose weight      Intervention Plan   Intervention  Prescribe, educate and counsel regarding individualized specific dietary modifications aiming towards targeted core components such as weight, hypertension, lipid management, diabetes, heart failure and other comorbidities.;Nutrition handout(s) given to patient.    Expected Outcomes  Short Term Goal: Understand basic principles of dietary content, such as calories, fat, sodium, cholesterol and nutrients.;Long Term Goal: Adherence to prescribed nutrition plan.       Nutrition Assessments: Nutrition Assessments - 07/17/18 1543      MEDFICTS Scores   Pre Score  3       Nutrition Goals Re-Evaluation:   Nutrition Goals Discharge (Final Nutrition Goals Re-Evaluation):   Psychosocial: Target Goals: Acknowledge presence or absence of significant depression and/or stress, maximize coping skills, provide positive support system. Participant is able to verbalize types and ability to use techniques and skills needed for reducing stress and depression.   Initial Review & Psychosocial Screening: Initial Psych Review & Screening - 07/17/18 1452      Initial Review   Current issues with  Current Sleep Concerns      Family Dynamics   Good Support System?  Yes    Comments  Her daughter is her support system. She has a positive outlook.      Barriers   Psychosocial barriers to participate in program  There are no identifiable barriers or psychosocial needs.;The patient should benefit from training in stress management and relaxation.      Screening Interventions   Interventions  Encouraged to exercise;Program counselor consult;To provide support and resources with identified psychosocial needs;Provide  feedback about the scores to participant    Expected Outcomes  Long Term Goal: Stressors or current issues are controlled or eliminated.;Short Term goal: Utilizing psychosocial counselor, staff and physician to assist with identification of specific Stressors or current issues interfering with healing process. Setting desired goal for each stressor or current issue identified.;Short Term goal: Identification and review with participant of any Quality of Life or Depression concerns found by scoring the questionnaire.;Long Term goal: The participant improves quality of Life and PHQ9 Scores as seen by post scores and/or verbalization of changes       Quality of Life Scores:  Scores of 19 and below usually indicate a poorer quality of life in these areas.  A difference of  2-3 points is a clinically meaningful difference.  A difference of 2-3 points in the total score of the Quality of Life Index has been associated with significant improvement in overall quality of life, self-image, physical symptoms, and general health in studies assessing change in quality of life.  PHQ-9: Recent Review Flowsheet Data    Depression screen [facility] 2/9 07/17/2018 05/10/2018   Decreased Interest 0 1   Down, Depressed, Hopeless 0 0   PHQ - 2 Score 0 1   Altered sleeping 2 0   Tired, decreased energy 2 1   Change in appetite 0 0   Feeling bad or failure about yourself  0 0   Trouble concentrating 0 1   Moving slowly or fidgety/restless 0 0   Suicidal thoughts 0 0   PHQ-9 Score 4 3   Difficult doing work/chores Somewhat difficult Somewhat difficult     Interpretation of Total Score  Total Score Depression Severity:  1-4 = Minimal depression, 5-9 = Mild depression, 10-14 = Moderate depression, 15-19 = Moderately severe depression, 20-27 = Severe depression   Psychosocial Evaluation and Intervention:   Psychosocial Re-Evaluation:   Psychosocial Discharge (Final Psychosocial  Re-Evaluation):   Education: Education Goals: Education classes will  be provided on a weekly basis, covering required topics. Participant will state understanding/return demonstration of topics presented.  Learning Barriers/Preferences: Learning Barriers/Preferences - 07/17/18 1455      Learning Barriers/Preferences   Learning Barriers  Hearing;Sight   wears glasses   Learning Preferences  None       Education Topics:  Initial Evaluation Education: - Verbal, written and demonstration of respiratory meds, oximetry and breathing techniques. Instruction on use of nebulizers and MDIs and importance of monitoring MDI activations.   Pulmonary Rehab from 07/17/2018 in [facility] Cardiac and Pulmonary Rehab  Date  07/17/18  Educator  [facility]  Instruction Review Code  1- Verbalizes Understanding      General Nutrition Guidelines/Fats and Fiber: -Group instruction provided by verbal, written material, models and posters to present the general guidelines for heart healthy nutrition. Gives an explanation and review of dietary fats and fiber.   Controlling Sodium/Reading Food Labels: -Group verbal and written material supporting the discussion of sodium use in heart healthy nutrition. Review and explanation with models, verbal and written materials for utilization of the food label.   Exercise Physiology & General Exercise Guidelines: - Group verbal and written instruction with models to review the exercise physiology of the cardiovascular system and associated critical values. Provides general exercise guidelines with specific guidelines to those with heart or lung disease.    Aerobic Exercise & Resistance Training: - Gives group verbal and written instruction on the various components of exercise. Focuses on aerobic and resistive training programs and the benefits of this training and how to safely progress through these programs.   Flexibility, Balance, Mind/Body Relaxation: Provides group  verbal/written instruction on the benefits of flexibility and balance training, including mind/body exercise modes such as yoga, pilates and tai chi.  Demonstration and skill practice provided.   Stress and Anxiety: - Provides group verbal and written instruction about the health risks of elevated stress and causes of high stress.  Discuss the correlation between heart/lung disease and anxiety and treatment options. Review healthy ways to manage with stress and anxiety.   Depression: - Provides group verbal and written instruction on the correlation between heart/lung disease and depressed mood, treatment options, and the stigmas associated with seeking treatment.   Exercise & Equipment Safety: - Individual verbal instruction and demonstration of equipment use and safety with use of the equipment.   Pulmonary Rehab from 07/17/2018 in [facility] Cardiac and Pulmonary Rehab  Date  07/17/18  Educator  [facility]  Instruction Review Code  1- Verbalizes Understanding      Infection Prevention: - Provides verbal and written material to individual with discussion of infection control including proper hand washing and proper equipment cleaning during exercise session.   Pulmonary Rehab from 07/17/2018 in [facility] Cardiac and Pulmonary Rehab  Date  07/17/18  Educator  [facility]  Instruction Review Code  1- Verbalizes Understanding      Falls Prevention: - Provides verbal and written material to individual with discussion of falls prevention and safety.   Pulmonary Rehab from 07/17/2018 in [facility] Cardiac and Pulmonary Rehab  Date  07/17/18  Educator  [facility]  Instruction Review Code  1- Verbalizes Understanding      Diabetes: - Individual verbal and written instruction to review signs/symptoms of diabetes, desired ranges of glucose level fasting, after meals and with exercise. Advice that pre and post exercise glucose checks will be done for 3 sessions at entry of program.   Pulmonary Rehab from 07/17/2018 in [facility] Cardiac and  Pulmonary Rehab  Date  07/17/18  Educator  [facility]  Instruction Review Code  1- Verbalizes Understanding      Chronic Lung Diseases: - Group verbal and written instruction to review updates, respiratory medications, advancements in procedures and treatments. Discuss use of supplemental oxygen including available portable oxygen systems, continuous and intermittent flow rates, concentrators, personal use and safety guidelines. Review proper use of inhaler and spacers. Provide informative websites for self-education.    Energy Conservation: - Provide group verbal and written instruction for methods to conserve energy, plan and organize activities. Instruct on pacing techniques, use of adaptive equipment and posture/positioning to relieve shortness of breath.   Triggers and Exacerbations: - Group verbal and written instruction to review types of environmental triggers and ways to prevent exacerbations. Discuss weather changes, air quality and the benefits of nasal washing. Review warning signs and symptoms to help prevent infections. Discuss techniques for effective airway clearance, coughing, and vibrations.   AED/CPR: - Group verbal and written instruction with the use of models to demonstrate the basic use of the AED with the basic ABC's of resuscitation.   Anatomy and Physiology of the Lungs: - Group verbal and written instruction with the use of models to provide basic lung anatomy and physiology related to function, structure and complications of lung disease.   Anatomy & Physiology of the Heart: - Group verbal and written instruction and models provide basic cardiac anatomy and physiology, with the coronary electrical and arterial systems. Review of Valvular disease and Heart Failure   Cardiac Medications: - Group verbal and written instruction to review commonly prescribed medications for heart disease. Reviews the medication, class of the drug, and side effects.   Know Your  Numbers and Risk Factors: -Group verbal and written instruction about important numbers in your health.  Discussion of what are risk factors and how they play a role in the disease process.  Review of Cholesterol, Blood Pressure, Diabetes, and BMI and the role they play in your overall health.   Sleep Hygiene: -Provides group verbal and written instruction about how sleep can affect your health.  Define sleep hygiene, discuss sleep cycles and impact of sleep habits. Review good sleep hygiene tips.    Other: -Provides group and verbal instruction on various topics (see comments)    Knowledge Questionnaire Score: Knowledge Questionnaire Score - 07/17/18 1455      Knowledge Questionnaire Score   Pre Score  15/18   reviewed with patient       Core Components/Risk Factors/Patient Goals at Admission: Personal Goals and Risk Factors at Admission - 07/17/18 1455      Core Components/Risk Factors/Patient Goals on Admission    Weight Management  Yes;Obesity;Weight Loss    Intervention  Weight Management: Develop a combined nutrition and exercise program designed to reach desired caloric intake, while maintaining appropriate intake of nutrient and fiber, sodium and fats, and appropriate energy expenditure required for the weight goal.;Weight Management: Provide education and appropriate resources to help participant work on and attain dietary goals.;Obesity: Provide education and appropriate resources to help participant work on and attain dietary goals.    Admit Weight  189 lb (85.7 kg)    Goal Weight: Short Term  184 lb (83.5 kg)    Goal Weight: Long Term  150 lb (68 kg)    Expected Outcomes  Short Term: Continue to assess and modify interventions until short term weight is achieved;Long Term: Adherence to nutrition and physical activity/exercise program aimed toward attainment of established weight goal;Weight Maintenance:  Understanding of the daily nutrition guidelines, which includes 25-35%  calories from fat, 7% or less cal from saturated fats, less than 261m cholesterol, less than 1.5gm of sodium, & 5 or more servings of fruits and vegetables daily;Weight Loss: Understanding of general recommendations for a balanced deficit meal plan, which promotes 1-2 lb weight loss per week and includes a negative energy balance of [phone] kcal/d;Understanding recommendations for meals to include 15-35% energy as protein, 25-35% energy from fat, 35-60% energy from carbohydrates, less than 2037mof dietary cholesterol, 20-35 gm of total fiber daily;Understanding of distribution of calorie intake throughout the day with the consumption of 4-5 meals/snacks    Improve shortness of breath with ADL's  Yes    Intervention  Provide education, individualized exercise plan and daily activity instruction to help decrease symptoms of SOB with activities of daily living.    Expected Outcomes  Short Term: Improve cardiorespiratory fitness to achieve a reduction of symptoms when performing ADLs;Long Term: Be able to perform more ADLs without symptoms or delay the onset of symptoms    Diabetes  Yes    Intervention  Provide education about signs/symptoms and action to take for hypo/hyperglycemia.;Provide education about proper nutrition, including hydration, and aerobic/resistive exercise prescription along with prescribed medications to achieve blood glucose in normal ranges: Fasting glucose 65-99 mg/dL    Expected Outcomes  Short Term: Participant verbalizes understanding of the signs/symptoms and immediate care of hyper/hypoglycemia, proper foot care and importance of medication, aerobic/resistive exercise and nutrition plan for blood glucose control.;Long Term: Attainment of HbA1C < 7%.    Heart Failure  Yes   AFID   Intervention  Provide a combined exercise and nutrition program that is supplemented with education, support and counseling about heart failure. Directed toward relieving symptoms such as shortness of  breath, decreased exercise tolerance, and extremity edema.    Expected Outcomes  Improve functional capacity of life;Short term: Attendance in program 2-3 days a week with increased exercise capacity. Reported lower sodium intake. Reported increased fruit and vegetable intake. Reports medication compliance.;Short term: Daily weights obtained and reported for increase. Utilizing diuretic protocols set by physician.;Long term: Adoption of self-care skills and reduction of barriers for early signs and symptoms recognition and intervention leading to self-care maintenance.    Hypertension  Yes    Intervention  Provide education on lifestyle modifcations including regular physical activity/exercise, weight management, moderate sodium restriction and increased consumption of fresh fruit, vegetables, and low fat dairy, alcohol moderation, and smoking cessation.;Monitor prescription use compliance.    Expected Outcomes  Short Term: Continued assessment and intervention until BP is < 140/9087mG in hypertensive participants. < 130/80m7m in hypertensive participants with diabetes, heart failure or chronic kidney disease.;Long Term: Maintenance of blood pressure at goal levels.       Core Components/Risk Factors/Patient Goals Review:    Core Components/Risk Factors/Patient Goals at Discharge (Final Review):    ITP Comments: ITP Comments    Row Name 07/17/18 1523 07/18/18 1006 07/24/18 0850 08/08/18 1445 08/09/18 1400   ITP Comments  Medical Evaluation completed. Chart sent for review and changes to Dr. [facility]Emily [facility]bertector [facility]osis can be found in [facility] encounter 05/18/18  Patients states that her insurance is changing this month and will not be able to start [facility] until the third week of February. Informed patient to call back in February for a definitive start date.  30 day review completed. ITP sent to Dr. [facility]ily [facility] with ITP  unless changes are made by  physician.  Patient is starting [facility] on 08/25/2018 due to insurance changes.  Patient states she cannot do [facility] because she may be on dialysis 3 days a week. Patient discharged.      Comments: Discharge ITP

## 2018-08-09 NOTE — Telephone Encounter (Signed)
Patient states she cannot do [facility] because she may be on dialysis 3 days a week. Patient discharged.

## 2018-08-09 NOTE — Progress Notes (Signed)
Discharge Progress Report  Patient Details  Name: Ashley Bird MRN: [id number] Date of Birth: 1935-09-07 Referring Provider:     Pulmonary Rehab from 07/17/2018 in [facility]  Referring Provider  Merton Border MD       Number of Visits: 1/36  Reason for Discharge:  Early Exit:  Personal  Smoking History:  Social History   Tobacco Use  Smoking Status Former Smoker  . Packs/day: 2.00  . Years: 30.00  . Pack years: 60.00  . Types: Cigarettes  . Last attempt to quit: 05/1979  . Years since quitting: 39.2  Smokeless Tobacco Never Used    Diagnosis:  Chronic obstructive pulmonary disease, unspecified COPD type ([place])  ADL UCSD: Pulmonary Assessment Scores    Row Name 07/17/18 1449         ADL UCSD   ADL Phase  Entry     SOB Score total  58     Rest  1     Walk  3     Stairs  5     Bath  3     Dress  2     Shop  4       CAT Score   CAT Score  20       mMRC Score   mMRC Score  2        Initial Exercise Prescription: Initial Exercise Prescription - 07/17/18 1600      Date of Initial Exercise RX and Referring Provider   Date  07/17/18    Referring Provider  Merton Border MD      Treadmill   MPH  1    Grade  0.5    Minutes  15    METs  1.83      NuStep   Level  1    SPM  80    Minutes  15    METs  1.8      Biostep-RELP   Level  1    SPM  50    Minutes  15    METs  2      Prescription Details   Frequency (times per week)  3    Duration  Progress to 45 minutes of aerobic exercise without signs/symptoms of physical distress      Intensity   THRR 40-80% of Max Heartrate  98-126    Ratings of Perceived Exertion  11-13    Perceived Dyspnea  0-4      Progression   Progression  Continue to progress workloads to maintain intensity without signs/symptoms of physical distress.      Resistance Training   Training Prescription  Yes    Weight  3 lbs    Reps  10-15       Discharge Exercise Prescription (Final Exercise  Prescription Changes): Exercise Prescription Changes - 07/17/18 1600      Response to Exercise   Blood Pressure (Admit)  134/66    Blood Pressure (Exercise)  154/72    Blood Pressure (Exit)  142/74    Heart Rate (Admit)  70 bpm    Heart Rate (Exercise)  110 bpm    Heart Rate (Exit)  84 bpm    Oxygen Saturation (Admit)  96 %    Oxygen Saturation (Exercise)  92 %    Oxygen Saturation (Exit)  95 %    Rating of Perceived Exertion (Exercise)  19    Perceived Dyspnea (Exercise)  3    Symptoms  back  pain 10/10; nausea 5/10; feels wobbly and afraid to fall    Comments  walk test results       Functional Capacity: 6 Minute Walk    Row Name 07/17/18 1556         6 Minute Walk   Phase  Initial     Distance  525 feet     Walk Time  4.7 minutes     # of Rest Breaks  2 18 sec, 1 min     MPH  1.57     METS  0.73     RPE  19     Perceived Dyspnea   3     VO2 Peak  2.57     Symptoms  Yes (comment)     Comments  back pain 10/10; nausea 5/10; feels wobbly and afraid of falling     Resting HR  70 bpm     Resting BP  134/66     Resting Oxygen Saturation   96 %     Exercise Oxygen Saturation  during 6 min walk  92 %     Max Ex. HR  110 bpm     Max Ex. BP  154/72     2 Minute Post BP  142/74       Interval HR   1 Minute HR  83     2 Minute HR  107     3 Minute HR  102     4 Minute HR  110     5 Minute HR  93     6 Minute HR  105     2 Minute Post HR  84     Interval Heart Rate?  Yes       Interval Oxygen   Interval Oxygen?  Yes     Baseline Oxygen Saturation %  96 %     1 Minute Oxygen Saturation %  92 %     1 Minute Liters of Oxygen  0 L Room Air     2 Minute Oxygen Saturation %  93 %     2 Minute Liters of Oxygen  0 L     3 Minute Oxygen Saturation %  94 %     3 Minute Liters of Oxygen  0 L     4 Minute Oxygen Saturation %  93 %     4 Minute Liters of Oxygen  0 L     5 Minute Oxygen Saturation %  98 %     5 Minute Liters of Oxygen  0 L     6 Minute Oxygen Saturation %   94 %     6 Minute Liters of Oxygen  0 L     2 Minute Post Oxygen Saturation %  95 %     2 Minute Post Liters of Oxygen  0 L        Psychological, QOL, Others - Outcomes: PHQ 2/9: Depression screen [facility] 2/9 07/17/2018 05/10/2018  Decreased Interest 0 1  Down, Depressed, Hopeless 0 0  PHQ - 2 Score 0 1  Altered sleeping 2 0  Tired, decreased energy 2 1  Change in appetite 0 0  Feeling bad or failure about yourself  0 0  Trouble concentrating 0 1  Moving slowly or fidgety/restless 0 0  Suicidal thoughts 0 0  PHQ-9 Score 4 3  Difficult doing work/chores Somewhat difficult Somewhat difficult    Quality of Life:   Personal Goals:  Goals established at orientation with interventions provided to work toward goal. Personal Goals and Risk Factors at Admission - 07/17/18 1455      Core Components/Risk Factors/Patient Goals on Admission    Weight Management  Yes;Obesity;Weight Loss    Intervention  Weight Management: Develop a combined nutrition and exercise program designed to reach desired caloric intake, while maintaining appropriate intake of nutrient and fiber, sodium and fats, and appropriate energy expenditure required for the weight goal.;Weight Management: Provide education and appropriate resources to help participant work on and attain dietary goals.;Obesity: Provide education and appropriate resources to help participant work on and attain dietary goals.    Admit Weight  189 lb (85.7 kg)    Goal Weight: Short Term  184 lb (83.5 kg)    Goal Weight: Long Term  150 lb (68 kg)    Expected Outcomes  Short Term: Continue to assess and modify interventions until short term weight is achieved;Long Term: Adherence to nutrition and physical activity/exercise program aimed toward attainment of established weight goal;Weight Maintenance: Understanding of the daily nutrition guidelines, which includes 25-35% calories from fat, 7% or less cal from saturated fats, less than 200mg  cholesterol, less  than 1.5gm of sodium, & 5 or more servings of fruits and vegetables daily;Weight Loss: Understanding of general recommendations for a balanced deficit meal plan, which promotes 1-2 lb weight loss per week and includes a negative energy balance of [phone] kcal/d;Understanding recommendations for meals to include 15-35% energy as protein, 25-35% energy from fat, 35-60% energy from carbohydrates, less than 200mg  of dietary cholesterol, 20-35 gm of total fiber daily;Understanding of distribution of calorie intake throughout the day with the consumption of 4-5 meals/snacks    Improve shortness of breath with ADL's  Yes    Intervention  Provide education, individualized exercise plan and daily activity instruction to help decrease symptoms of SOB with activities of daily living.    Expected Outcomes  Short Term: Improve cardiorespiratory fitness to achieve a reduction of symptoms when performing ADLs;Long Term: Be able to perform more ADLs without symptoms or delay the onset of symptoms    Diabetes  Yes    Intervention  Provide education about signs/symptoms and action to take for hypo/hyperglycemia.;Provide education about proper nutrition, including hydration, and aerobic/resistive exercise prescription along with prescribed medications to achieve blood glucose in normal ranges: Fasting glucose 65-99 mg/dL    Expected Outcomes  Short Term: Participant verbalizes understanding of the signs/symptoms and immediate care of hyper/hypoglycemia, proper foot care and importance of medication, aerobic/resistive exercise and nutrition plan for blood glucose control.;Long Term: Attainment of HbA1C < 7%.    Heart Failure  Yes   AFID   Intervention  Provide a combined exercise and nutrition program that is supplemented with education, support and counseling about heart failure. Directed toward relieving symptoms such as shortness of breath, decreased exercise tolerance, and extremity edema.    Expected Outcomes  Improve  functional capacity of life;Short term: Attendance in program 2-3 days a week with increased exercise capacity. Reported lower sodium intake. Reported increased fruit and vegetable intake. Reports medication compliance.;Short term: Daily weights obtained and reported for increase. Utilizing diuretic protocols set by physician.;Long term: Adoption of self-care skills and reduction of barriers for early signs and symptoms recognition and intervention leading to self-care maintenance.    Hypertension  Yes    Intervention  Provide education on lifestyle modifcations including regular physical activity/exercise, weight management, moderate sodium restriction and increased consumption of fresh fruit, vegetables, and low  fat dairy, alcohol moderation, and smoking cessation.;Monitor prescription use compliance.    Expected Outcomes  Short Term: Continued assessment and intervention until BP is < 140/64mm HG in hypertensive participants. < 130/24mm HG in hypertensive participants with diabetes, heart failure or chronic kidney disease.;Long Term: Maintenance of blood pressure at goal levels.        Personal Goals Discharge:   Exercise Goals and Review: Exercise Goals    Row Name 07/17/18 1605             Exercise Goals   Increase Physical Activity  Yes       Intervention  Provide advice, education, support and counseling about physical activity/exercise needs.;Develop an individualized exercise prescription for aerobic and resistive training based on initial evaluation findings, risk stratification, comorbidities and participant's personal goals.       Expected Outcomes  Short Term: Attend rehab on a regular basis to increase amount of physical activity.;Long Term: Add in home exercise to make exercise part of routine and to increase amount of physical activity.;Long Term: Exercising regularly at least 3-5 days a week.       Increase Strength and Stamina  Yes       Intervention  Provide advice,  education, support and counseling about physical activity/exercise needs.;Develop an individualized exercise prescription for aerobic and resistive training based on initial evaluation findings, risk stratification, comorbidities and participant's personal goals.       Expected Outcomes  Short Term: Increase workloads from initial exercise prescription for resistance, speed, and METs.;Short Term: Perform resistance training exercises routinely during rehab and add in resistance training at home;Long Term: Improve cardiorespiratory fitness, muscular endurance and strength as measured by increased METs and functional capacity (6MWT)       Able to understand and use rate of perceived exertion (RPE) scale  Yes       Intervention  Provide education and explanation on how to use RPE scale       Expected Outcomes  Short Term: Able to use RPE daily in rehab to express subjective intensity level;Long Term:  Able to use RPE to guide intensity level when exercising independently       Able to understand and use Dyspnea scale  Yes       Intervention  Provide education and explanation on how to use Dyspnea scale       Expected Outcomes  Long Term: Able to use Dyspnea scale to guide intensity level when exercising independently;Short Term: Able to use Dyspnea scale daily in rehab to express subjective sense of shortness of breath during exertion       Knowledge and understanding of Target Heart Rate Range (THRR)  Yes       Intervention  Provide education and explanation of THRR including how the numbers were predicted and where they are located for reference       Expected Outcomes  Short Term: Able to state/look up THRR;Short Term: Able to use daily as guideline for intensity in rehab;Long Term: Able to use THRR to govern intensity when exercising independently       Able to check pulse independently  Yes       Intervention  Provide education and demonstration on how to check pulse in carotid and radial  arteries.;Review the importance of being able to check your own pulse for safety during independent exercise       Expected Outcomes  Short Term: Able to explain why pulse checking is important during independent exercise;Long Term: Able to  check pulse independently and accurately       Understanding of Exercise Prescription  Yes       Intervention  Provide education, explanation, and written materials on patient's individual exercise prescription       Expected Outcomes  Short Term: Able to explain program exercise prescription;Long Term: Able to explain home exercise prescription to exercise independently          Exercise Goals Re-Evaluation:   Nutrition & Weight - Outcomes: Pre Biometrics - 07/17/18 1606      Pre Biometrics   Height  5' 0.5" (1.537 m)    Weight  189 lb 11.2 oz (86 kg)    Waist Circumference  41 inches    Hip Circumference  48 inches    Waist to Hip Ratio  0.85 %    BMI (Calculated)  36.42    Single Leg Stand  1.46 seconds        Nutrition: Nutrition Therapy & Goals - 07/17/18 1454      Personal Nutrition Goals   Nutrition Goal  Learn to eat healthier    Personal Goal #2  Lose weight      Intervention Plan   Intervention  Prescribe, educate and counsel regarding individualized specific dietary modifications aiming towards targeted core components such as weight, hypertension, lipid management, diabetes, heart failure and other comorbidities.;Nutrition handout(s) given to patient.    Expected Outcomes  Short Term Goal: Understand basic principles of dietary content, such as calories, fat, sodium, cholesterol and nutrients.;Long Term Goal: Adherence to prescribed nutrition plan.       Nutrition Discharge: Nutrition Assessments - 07/17/18 1543      MEDFICTS Scores   Pre Score  3       Education Questionnaire Score: Knowledge Questionnaire Score - 07/17/18 1455      Knowledge Questionnaire Score   Pre Score  15/18   reviewed with patient       Goals reviewed with patient; copy given to patient.

## 2018-08-11 ENCOUNTER — Ambulatory Visit: Payer: PRIVATE HEALTH INSURANCE

## 2018-08-14 ENCOUNTER — Ambulatory Visit: Payer: PRIVATE HEALTH INSURANCE

## 2018-08-16 ENCOUNTER — Ambulatory Visit: Payer: PRIVATE HEALTH INSURANCE

## 2018-08-18 ENCOUNTER — Ambulatory Visit: Payer: PRIVATE HEALTH INSURANCE

## 2018-08-21 ENCOUNTER — Ambulatory Visit: Payer: PRIVATE HEALTH INSURANCE

## 2018-08-23 ENCOUNTER — Ambulatory Visit: Payer: PRIVATE HEALTH INSURANCE

## 2018-08-25 ENCOUNTER — Ambulatory Visit: Payer: PRIVATE HEALTH INSURANCE

## 2018-08-28 ENCOUNTER — Ambulatory Visit: Payer: PRIVATE HEALTH INSURANCE

## 2018-08-30 ENCOUNTER — Ambulatory Visit: Payer: PRIVATE HEALTH INSURANCE

## 2018-09-01 ENCOUNTER — Ambulatory Visit: Payer: PRIVATE HEALTH INSURANCE

## 2018-09-04 ENCOUNTER — Ambulatory Visit: Payer: PRIVATE HEALTH INSURANCE

## 2018-09-06 ENCOUNTER — Ambulatory Visit: Payer: PRIVATE HEALTH INSURANCE

## 2018-09-08 ENCOUNTER — Ambulatory Visit: Payer: PRIVATE HEALTH INSURANCE

## 2018-09-11 ENCOUNTER — Ambulatory Visit: Payer: PRIVATE HEALTH INSURANCE

## 2018-09-13 ENCOUNTER — Ambulatory Visit: Payer: PRIVATE HEALTH INSURANCE

## 2018-09-13 ENCOUNTER — Telehealth: Payer: Self-pay | Admitting: Nurse Practitioner

## 2018-09-13 NOTE — Telephone Encounter (Signed)
I called and schedule with [facility] inhome f/u appt for October 03, 2018 Tuesday at 1pm

## 2018-09-15 ENCOUNTER — Ambulatory Visit: Payer: PRIVATE HEALTH INSURANCE

## 2018-09-18 ENCOUNTER — Ambulatory Visit: Payer: PRIVATE HEALTH INSURANCE

## 2018-09-20 ENCOUNTER — Ambulatory Visit: Payer: PRIVATE HEALTH INSURANCE

## 2018-09-22 ENCOUNTER — Ambulatory Visit: Payer: PRIVATE HEALTH INSURANCE

## 2018-09-25 ENCOUNTER — Ambulatory Visit: Payer: PRIVATE HEALTH INSURANCE

## 2018-09-27 ENCOUNTER — Ambulatory Visit: Payer: PRIVATE HEALTH INSURANCE

## 2018-09-27 ENCOUNTER — Ambulatory Visit: Payer: PRIVATE HEALTH INSURANCE | Admitting: Pulmonary Disease

## 2018-09-29 ENCOUNTER — Ambulatory Visit: Payer: PRIVATE HEALTH INSURANCE

## 2018-10-02 ENCOUNTER — Ambulatory Visit: Payer: PRIVATE HEALTH INSURANCE

## 2018-10-04 ENCOUNTER — Ambulatory Visit: Payer: PRIVATE HEALTH INSURANCE

## 2018-10-05 ENCOUNTER — Telehealth: Payer: Self-pay

## 2018-10-05 NOTE — Telephone Encounter (Signed)
Patient contacted for Palliative Care visit.  Due to the current COVID-19 infection/crises, the patient prefer, and have given their verbal consent for, a provider visit via telemedicine. HIPPA policies of confidentially were discussed and patient/family expressed understanding.  Visit scheduled for 10/10/2018 @ 4pm

## 2018-10-06 ENCOUNTER — Ambulatory Visit: Payer: PRIVATE HEALTH INSURANCE

## 2018-10-09 ENCOUNTER — Telehealth: Payer: Self-pay | Admitting: Pulmonary Disease

## 2018-10-09 ENCOUNTER — Ambulatory Visit: Payer: PRIVATE HEALTH INSURANCE

## 2018-10-09 NOTE — Telephone Encounter (Signed)
Received letter from [facility] stating that office visit for cpap compliance is needed.  Pt setup on cpap 08/15/18. I have left a message for pt to schedule office visit for compliance.

## 2018-10-10 ENCOUNTER — Other Ambulatory Visit: Payer: Self-pay

## 2018-10-10 ENCOUNTER — Other Ambulatory Visit: Payer: Medicare Other | Admitting: Nurse Practitioner

## 2018-10-10 NOTE — Telephone Encounter (Signed)
Pt has been scheduled for phone visit on 10/17/18 with DS.

## 2018-10-11 ENCOUNTER — Ambulatory Visit: Payer: PRIVATE HEALTH INSURANCE

## 2018-10-13 ENCOUNTER — Ambulatory Visit: Payer: PRIVATE HEALTH INSURANCE

## 2018-10-13 ENCOUNTER — Telehealth: Payer: Self-pay | Admitting: Nurse Practitioner

## 2018-10-13 NOTE — Telephone Encounter (Signed)
Ashley Bird called, she said Dr Leslie Dales clinic office is closed. I returned her call. She is having itchy eyes, stopped up nose. I called Dr Hedrick's office number, did say it was closed but gave direction to speak with on call primary care provider. I called Ashley Bird back with that information to contact the on call physician for [facility] clinic.

## 2018-10-16 ENCOUNTER — Ambulatory Visit: Payer: PRIVATE HEALTH INSURANCE

## 2018-10-17 ENCOUNTER — Ambulatory Visit: Payer: PRIVATE HEALTH INSURANCE | Admitting: Pulmonary Disease

## 2018-10-17 ENCOUNTER — Telehealth: Payer: PRIVATE HEALTH INSURANCE | Admitting: Pulmonary Disease

## 2018-10-18 ENCOUNTER — Ambulatory Visit: Payer: PRIVATE HEALTH INSURANCE

## 2018-10-19 ENCOUNTER — Telehealth: Payer: Self-pay

## 2018-10-19 NOTE — Telephone Encounter (Signed)
Palliative care visit was rescheduled for 10/30/2018 @ 3pm.

## 2018-10-20 ENCOUNTER — Ambulatory Visit: Payer: PRIVATE HEALTH INSURANCE

## 2018-10-23 ENCOUNTER — Ambulatory Visit: Payer: PRIVATE HEALTH INSURANCE

## 2018-10-25 ENCOUNTER — Ambulatory Visit: Payer: PRIVATE HEALTH INSURANCE

## 2018-10-27 ENCOUNTER — Ambulatory Visit: Payer: PRIVATE HEALTH INSURANCE

## 2018-10-30 ENCOUNTER — Ambulatory Visit: Payer: PRIVATE HEALTH INSURANCE

## 2018-10-30 ENCOUNTER — Ambulatory Visit (INDEPENDENT_AMBULATORY_CARE_PROVIDER_SITE_OTHER): Payer: Medicare Other | Admitting: Pulmonary Disease

## 2018-10-30 ENCOUNTER — Encounter: Payer: Self-pay | Admitting: Nurse Practitioner

## 2018-10-30 ENCOUNTER — Other Ambulatory Visit: Payer: Medicare Other | Admitting: Nurse Practitioner

## 2018-10-30 ENCOUNTER — Encounter: Payer: Self-pay | Admitting: Pulmonary Disease

## 2018-10-30 ENCOUNTER — Other Ambulatory Visit: Payer: Self-pay

## 2018-10-30 DIAGNOSIS — G4733 Obstructive sleep apnea (adult) (pediatric): Secondary | ICD-10-CM

## 2018-10-30 DIAGNOSIS — R0602 Shortness of breath: Secondary | ICD-10-CM

## 2018-10-30 DIAGNOSIS — J449 Chronic obstructive pulmonary disease, unspecified: Secondary | ICD-10-CM

## 2018-10-30 DIAGNOSIS — Z515 Encounter for palliative care: Secondary | ICD-10-CM

## 2018-10-30 NOTE — Progress Notes (Signed)
[facility] Consult Note Telephone: [phone]  Fax: [phone]  PATIENT NAME: Ashley Bird DOB: March 02, 1936 MRN: [id number]  PRIMARY CARE PROVIDER:   Maryland Pink, MD  REFERRING PROVIDER:  Maryland Pink, MD [address] [facility] [place], [place] [address]  RESPONSIBLE PARTY:   Self  Due to the COVID-19 crisis, this visit was done via telemedicine from my office and it was initiated and consent by this patient and or family.  RECOMMENDATIONS and PLAN:  1. Palliative care encounter Z51.5; Palliative medicine team will continue to support patient, patient's family, and medical team. Visit consisted of counseling and education dealing with the complex and emotionally intense issues of symptom management and palliative care in the setting of serious and potentially life-threatening illness  2. Dyspneic R06.00 secondary to COPD remain stable at present time. Continue CPAP, albuterol as needed, advair  ASSESSMENT:     I called [name] for Telehealth visit with video. We talked about purpose for palliative care visit. We talked about how she's feeling today. We talked about past medical history with chronic disease progression of COPD. We talked about last hospitalization. She talked about how sick she was. She talked about previously she was on oxygen at home when she was bed-bound with a broken leg though now with her CPAP she is getting more rest and not 02 dependent. We talked at length about her CPAP. She talked about feeling restful and her visit with Dr. Alva Garnet. We talked about symptoms of shortness of breath. She shared that she used to be able to walk around the grocery store pushing a grocery cart but not sure if she could still do that as she has been home about adding social isolation. She shared that with her CPAP she's sleeping better, not as tired during the day and no longer napping. She shared that she used to regret  going to bed when she was wearing the CPAP mask but with the nasal cannula she said that she looks forward to sleeping now.  We talked about symptoms of pain and she shared that she no longer has any pain in her back. She is not sure if it's because she is getting more rest, the CPAP machine or no longer sitting on a hard church benches. We talked about her appetite. She has a good appetite is too good. She talks about her weight fluctuating what she tries to keep an eye on. We talked about medical goals of care including aggressive versus conservative versus comfort care. We talked about code status says she is a do not resuscitate and has a [place] form in her home. We talked about the most form. Her wishes are to continue to return to the hospital if needed, IV fluids, antibiotic therapy but may not wish for a feeding tube. We talked about the hard choice book and will mail Ms Buick a copy in addition to a blank most form for her to look at and review with her family. We talked about completing the most form at next palliative care visit. Talked about role of palliative care and plan of care. Is it scheduled for four weeks as she appears to be doing well. Therapeutic listening and emotional support provided. Contact information provided. Questions answered to satisfaction.  2 / 3 / 2020 weight 186.5lbs  I spent 45 minutes providing this consultation,  from 3:00pm to 3:45pm. More than 50% of the time in this consultation was spent  coordinating communication.   HISTORY OF PRESENT ILLNESS:  Ashley Bird is a 83 y.o. year old female with multiple medical problems including Paroxysmal atrial fibrillation, COPD, congestive heart failure, coronary artery disease, diabetes, hyperlipidemia, hypertension, abdominal hysterectomy. Hospitalized 10 /21 / 2019 to 05/04/2018 for shortness of breath, acute respiratory distress, COPD, sepsis with acute community-acquired pneumonia, acute systolic congestive heart  failure, proximal atrial fibrillation, diabetes. She did require BiPAP with ICU step-down monitoring what she was weaned off to nasal cannula upon discharge. COPD exacerbation improve with prednisone, nebulizer treatments and Advair. Acute on chronic diastolic congestive heart failure with moderate aortic stenosis, left under branch block with elevated troponin. She continued to aspirin with amiodarone and diuresed Lasix as needed. Chronic kidney disease stage 3 stable. Proximal atrial fibrillation on coumadin monitoring INR, continue amiodarone. She was discharged home with Home Health. She saw Dr Alva Garnet today 4/20 / 2020 pulmonology for obstructive sleep apnea with moderate COPD. She shared the CPAP is working well changed from full mask to nasal cannula sleep quality is better. COPD still using Advair compliantly and feels that her exertional dyspnea is improved. She is able to do housework without becoming breathlessness. No use of Albuterol. Follow up 5 to 6 months as needed. She lives at home, she is able to ambulate independently, adl's. Palliative Care was asked to help address goals of care.   CODE STATUS: DNR PPS: 60% HOSPICE ELIGIBILITY/DIAGNOSIS: TBD  PAST MEDICAL HISTORY:  Past Medical History:  Diagnosis Date   CAD (coronary artery disease)    CHF (congestive heart failure) (HCC)    COPD (chronic obstructive pulmonary disease) (HCC)    Diabetes ([place])    HLD (hyperlipidemia)    HTN (hypertension)    PAF (paroxysmal atrial fibrillation) (HCC)     SOCIAL HX:  Social History   Tobacco Use   Smoking status: Former Smoker    Packs/day: 2.00    Years: 30.00    Pack years: 60.00    Types: Cigarettes    Last attempt to quit: 05/1979    Years since quitting: 39.4   Smokeless tobacco: Never Used  Substance Use Topics   Alcohol use: Not Currently    ALLERGIES:  Allergies  Allergen Reactions   Alendronate    Doxycycline Swelling    Swelling and hemmorage       Effexor [Venlafaxine] Other (See Comments)    Body ached    Ferralet [Iron-Folic [id number]] Nausea And Vomiting    Vomiting    Ferrous Sulfate Nausea And Vomiting   Penicillins Swelling    Per pt: swelling and hemmorage      Prozac [Fluoxetine]    Simvastatin Other (See Comments)    Muscle pain    Zoloft [Sertraline Hcl]      PERTINENT MEDICATIONS:  Outpatient Encounter Medications as of 10/30/2018  Medication Sig   acetaminophen (TYLENOL) 325 MG tablet Take 325 mg by mouth every 6 (six) hours as needed.   ADVAIR DISKUS 100-50 MCG/DOSE AEPB Inhale 1 puff into the lungs every 12 (twelve) hours.   albuterol (ACCUNEB) 1.25 MG/3ML nebulizer solution Inhale into the lungs.   albuterol (PROVENTIL HFA;VENTOLIN HFA) 108 (90 Base) MCG/ACT inhaler Inhale into the lungs.   amiodarone (PACERONE) 200 MG tablet Take 200 mg by mouth daily.   b complex vitamins tablet Take 1 tablet by mouth daily.   calcium carbonate (OSCAL) 1500 (600 Ca) MG TABS tablet Take 600 mg of elemental calcium by mouth daily with breakfast.   cetirizine (  ZYRTEC) 10 MG tablet Take 10 mg by mouth daily.   Cholecalciferol (VITAMIN D3) 5000 units TABS Take 1 tablet by mouth daily.   furosemide (LASIX) 20 MG tablet Take 20 mg by mouth as needed.    metoprolol succinate (TOPROL-XL) 50 MG 24 hr tablet Take 50 mg by mouth daily.   Multiple Vitamins-Minerals (MULTIVITAMIN GUMMIES WOMENS) CHEW Chew 1 tablet by mouth daily.   olmesartan-hydrochlorothiazide (BENICAR HCT) 40-25 MG tablet Take 1 tablet by mouth daily.   traMADol (ULTRAM) 50 MG tablet TAKE 1 TO 2 TABLETS BY MOUTH EVERY 4 TO 6 HOURS AS NEEDED   warfarin (COUMADIN) 2 MG tablet Take 2 mg by mouth daily. On Monday   No facility-administered encounter medications on file as of 10/30/2018.     PHYSICAL EXAM:  Deferred Issai Werling Z Kasy Iannacone, NP

## 2018-10-30 NOTE — Progress Notes (Signed)
Virtual Visit via Video Note  I connected with Ashley Bird on 10/30/18 at  9:30 AM EDT by telephone and verified that I am speaking with the correct person using two identifiers.   I discussed the limitations of evaluation and management by telemedicine and the availability of in person appointments. The patient expressed understanding and agreed to proceed.  PROBLEMS: Obstructive sleep apnea Moderate COPD  History of Present Illness: This was a phone visit performed during the COVID-19 pandemic.  This is a routine follow-up for the above problems.  She states that the CPAP is "working really well ".  She has been changed to a nasal mask which she tolerates better than the full mask.  She believes her sleep quality is good.  She wakes up feeling refreshed.  She denies morning headache.  She has minimal daytime hypersomnolence.  With regard to COPD, she is still using Advair compliantly and feels that her exertional dyspnea is much improved.  She notes that she is able to do housework without becoming breathless.Marland Kitchen  He is not using albuterol at all.  She denies CP, fever, purulent sputum, hemoptysis, LE edema and calf tenderness.   Observations/Objective: Over this phone evaluation, she did not appear to be breathless and was able to speak in full sentences  Assessment and Plan: Obstructive sleep apnea, well controlled on CPAP Moderate COPD, well controlled on Advair  Continue nocturnal CPAP with sleep Continue Advair 100/50 twice daily Continue albuterol inhaler as needed  Follow Up Instructions: ROV 5-6 months   I discussed the assessment and treatment plan with the patient. The patient was provided an opportunity to ask questions and all were answered. The patient agreed with the plan and demonstrated an understanding of the instructions.   The patient was advised to call back or seek an in-person evaluation if the symptoms worsen or if the condition fails to improve as  anticipated.  I provided 15 minutes of non-face-to-face time during this encounter.  Merton Border, MD PCCM service Mobile [phone] Pager [phone] 10/30/2018 10:34 AM

## 2018-11-01 ENCOUNTER — Ambulatory Visit: Payer: PRIVATE HEALTH INSURANCE

## 2018-11-03 ENCOUNTER — Ambulatory Visit: Payer: PRIVATE HEALTH INSURANCE

## 2018-11-06 ENCOUNTER — Ambulatory Visit: Payer: PRIVATE HEALTH INSURANCE

## 2018-11-08 ENCOUNTER — Ambulatory Visit: Payer: PRIVATE HEALTH INSURANCE

## 2018-11-10 ENCOUNTER — Ambulatory Visit: Payer: PRIVATE HEALTH INSURANCE

## 2018-11-13 ENCOUNTER — Ambulatory Visit: Payer: PRIVATE HEALTH INSURANCE

## 2018-11-15 ENCOUNTER — Ambulatory Visit: Payer: PRIVATE HEALTH INSURANCE

## 2018-11-28 ENCOUNTER — Encounter: Payer: Self-pay | Admitting: Nurse Practitioner

## 2018-11-28 ENCOUNTER — Other Ambulatory Visit: Payer: Medicare Other | Admitting: Nurse Practitioner

## 2018-11-28 DIAGNOSIS — R0602 Shortness of breath: Secondary | ICD-10-CM | POA: Insufficient documentation

## 2018-11-28 DIAGNOSIS — Z515 Encounter for palliative care: Secondary | ICD-10-CM | POA: Insufficient documentation

## 2018-11-28 NOTE — Progress Notes (Signed)
[facility] Consult Note Telephone: [phone]  Fax: [phone]  PATIENT NAME: Ashley Bird DOB: 02-10-1936 MRN: [id number]  PRIMARY CARE PROVIDER:   Maryland Pink, MD  REFERRING PROVIDER:  Maryland Pink, MD [address] [facility] [place], [place] [address]  RESPONSIBLE PARTY:   Self  Due to the COVID-19 crisis, this visit was done via telemedicine/Telehealth is it by telephone as video equipment not functioning for Ms. Zammit from my office and it was initiated and consent by this patient and or family.  RECOMMENDATIONS and PLAN:  1. Palliative care encounter Z51.5; Palliative medicine team will continue to support patient, patient's family, and medical team. Visit consisted of counseling and education dealing with the complex and emotionally intense issues of symptom management and palliative care in the setting of serious and potentially life-threatening illness  2. Dyspneic R06.00 secondary to COPD remain stable at present time. Continue CPAP, albuterol as needed, advair   ASSESSMENT:     I called Ms. Stann Mainland. We talked about purpose through palliative care visit for follow-up. Ms Botkin and agreement. We talked about how she's feeling today and she replies that she is doing good. She had to stop using your CPAP machine due to increasing nosebleeds. We talked about different options to help including humidifier in the room. She did get sterile water to help with her CPAP machine. We also talked about nasal saline sprays if needed that can be purchased over the counter and use before bedtime. We talked about her increase in being tired from obstructive sleep apnea and not being able to use her CPAP machine over the last week. She shared that she's back to taking naps. She feels okay when she takes her naps but much better when she's able to use her CPAP machine. She shared that she has not had a nosebleed in a couple days so  she will try her machine tonight with the saline. She denies any symptoms of pain. We talked about her appetite which is good. She talked about the things that she's been doing staying isolated at home due to covid-19 demek. She watches cooking shows and works on puzzles on her computer. She shared that she is doing well. We talked about medical goals of care. We talked about the most form that was sent in she did receive with her choice book. She did read the hard choice book and has now given to her daughters for review. She shared that her daughter that she does not live with has been crying a lot about her mom being so sick when she was hospitalized. She felt like the hard choice book would be helpful for her and then further discussion about goals of care. Ms Co endorses she prefers to wait until able to further discuss with her daughters. Ms Fahy endorses may be able to complete it next palliative care visit and we'll schedule in 6 weeks if needed or sooner should she declined. Ms Tricarico in agreement. She verbalize she was thankful for palliative care visit and support. Palliative care follow-up appointment schedule. Questions answered satisfaction. Contact information provided.  I spent 40 minutes providing this consultation,  from 2:00pm to 2:40pm. More than 50% of the time in this consultation was spent coordinating communication.   HISTORY OF PRESENT ILLNESS:  Ashley Bird is a 83 y.o. year old female with multiple medical problems including  Paroxysmal atrial fibrillation, COPD, congestive heart failure, coronary artery disease, diabetes,  hyperlipidemia, hypertension, abdominal hysterectomy. Palliative Care was asked to help to continue to address goals of care.   CODE STATUS: DNR  PPS: 60% HOSPICE ELIGIBILITY/DIAGNOSIS: TBD  PAST MEDICAL HISTORY:  Past Medical History:  Diagnosis Date  . CAD (coronary artery disease)   . CHF (congestive heart failure) ([place])   . COPD (chronic  obstructive pulmonary disease) ([place])   . Diabetes ([place])   . HLD (hyperlipidemia)   . HTN (hypertension)   . PAF (paroxysmal atrial fibrillation) ([place])     SOCIAL HX:  Social History   Tobacco Use  . Smoking status: Former Smoker    Packs/day: 2.00    Years: 30.00    Pack years: 60.00    Types: Cigarettes    Last attempt to quit: 05/1979    Years since quitting: 39.5  . Smokeless tobacco: Never Used  Substance Use Topics  . Alcohol use: Not Currently    ALLERGIES:  Allergies  Allergen Reactions  . Alendronate   . Doxycycline Swelling    Swelling and hemmorage     . Effexor [Venlafaxine] Other (See Comments)    Body ached   . Ferralet [Iron-Folic [id number]] Nausea And Vomiting    Vomiting   . Ferrous Sulfate Nausea And Vomiting  . Penicillins Swelling    Per pt: swelling and hemmorage     . Prozac [Fluoxetine]   . Simvastatin Other (See Comments)    Muscle pain   . Zoloft [Sertraline Hcl]      PERTINENT MEDICATIONS:  Outpatient Encounter Medications as of 11/28/2018  Medication Sig  . acetaminophen (TYLENOL) 325 MG tablet Take 325 mg by mouth every 6 (six) hours as needed.  Marland Kitchen ADVAIR DISKUS 100-50 MCG/DOSE AEPB Inhale 1 puff into the lungs every 12 (twelve) hours.  Marland Kitchen albuterol (ACCUNEB) 1.25 MG/3ML nebulizer solution Inhale into the lungs.  Marland Kitchen albuterol (PROVENTIL HFA;VENTOLIN HFA) 108 (90 Base) MCG/ACT inhaler Inhale into the lungs.  Marland Kitchen amiodarone (PACERONE) 200 MG tablet Take 200 mg by mouth daily.  Marland Kitchen b complex vitamins tablet Take 1 tablet by mouth daily.  . calcium carbonate (OSCAL) 1500 (600 Ca) MG TABS tablet Take 600 mg of elemental calcium by mouth daily with breakfast.  . cetirizine (ZYRTEC) 10 MG tablet Take 10 mg by mouth daily.  . Cholecalciferol (VITAMIN D3) 5000 units TABS Take 1 tablet by mouth daily.  . furosemide (LASIX) 20 MG tablet Take 20 mg by mouth as needed.   . metoprolol succinate (TOPROL-XL) 50 MG 24 hr tablet Take 50 mg by  mouth daily.  . Multiple Vitamins-Minerals (MULTIVITAMIN GUMMIES WOMENS) CHEW Chew 1 tablet by mouth daily.  Marland Kitchen olmesartan-hydrochlorothiazide (BENICAR HCT) 40-25 MG tablet Take 1 tablet by mouth daily.  . traMADol (ULTRAM) 50 MG tablet TAKE 1 TO 2 TABLETS BY MOUTH EVERY 4 TO 6 HOURS AS NEEDED  . warfarin (COUMADIN) 2 MG tablet Take 2 mg by mouth daily. On Monday   No facility-administered encounter medications on file as of 11/28/2018.     PHYSICAL EXAM:  deferred  Christin Ihor Gully, NP

## 2018-11-29 ENCOUNTER — Other Ambulatory Visit: Payer: Self-pay

## 2019-01-02 ENCOUNTER — Telehealth: Payer: Self-pay

## 2019-01-02 NOTE — Telephone Encounter (Signed)
TELEPHONE CALL NOTE  Ashley Bird has been deemed a candidate for a follow-up tele-health visit to limit community exposure during the Covid-19 pandemic. I spoke with the patient via phone to ensure availability of phone/video source, confirm preferred email & phone number, discuss instructions and expectations, and review consent.   I reminded Ashley Bird to be prepared with any vital sign and/or heart rhythm information that could potentially be obtained via home monitoring, at the time of her visit.  Finally, I reminded Ashley Bird to expect an e-mail containing a link for their video-based visit approximately 15 minutes before her visit, or alternatively, a phone call at the time of her visit if her visit is planned to be a phone encounter.  Did the patient verbally consent to treatment as below? YES  ALLRED, AMANDA L, CMA 01/02/2019 1:28 PM  CONSENT FOR TELE-HEALTH VISIT - PLEASE REVIEW  I hereby voluntarily request, consent and authorize [facility] and its employed or contracted physicians, physician assistants, nurse practitioners or other licensed health care professionals (the Practitioner), to provide me with telemedicine health care services (the "Services") as deemed necessary by the treating Practitioner. I acknowledge and consent to receive the Services by the Practitioner via telemedicine. I understand that the telemedicine visit will involve communicating with the Practitioner through telephonic communication technology and the disclosure of certain medical information by electronic transmission. I acknowledge that I have been given the opportunity to request an in-person assessment or other available alternative prior to the telemedicine visit and am voluntarily participating in the telemedicine visit.  I understand that I have the right to withhold or withdraw my consent to the use of telemedicine in the course of my care at any time, without affecting my right  to future care or treatment, and that the Practitioner or I may terminate the telemedicine visit at any time. I understand that I have the right to inspect all information obtained and/or recorded in the course of the telemedicine visit and may receive copies of available information for a reasonable fee.  I understand that some of the potential risks of receiving the Services via telemedicine include:  Marland Kitchen Delay or interruption in medical evaluation due to technological equipment failure or disruption; . Information transmitted may not be sufficient (e.g. poor resolution of images) to allow for appropriate medical decision making by the Practitioner; and/or  . In rare instances, security protocols could fail, causing a breach of personal health information.  Furthermore, I acknowledge that it is my responsibility to provide information about my medical history, conditions and care that is complete and accurate to the best of my ability. I acknowledge that Practitioner's advice, recommendations, and/or decision may be based on factors not within their control, such as incomplete or inaccurate data provided by me or lack of visual representation. I understand that the practice of medicine is not an exact science and that Practitioner makes no warranties or guarantees regarding treatment outcomes. I acknowledge that I will receive a copy of this consent concurrently upon execution via email to the email address I last provided but may also request a printed copy by calling the office of [facility].    I understand that my insurance may be billed for this visit.   I have read or had this consent read to me. . I understand the contents of this consent, which adequately explains the benefits and risks of the Services being provided via telemedicine.  Marland Kitchen  I have been provided ample opportunity to ask questions regarding this consent and the Services and have had my questions answered to my  satisfaction. . I give my informed consent for the services to be provided through the use of telemedicine in my medical care  By participating in this telemedicine visit I agree to the above.

## 2019-01-02 NOTE — Telephone Encounter (Signed)
   TELEPHONE CALL NOTE  This patient has been deemed a candidate for follow-up tele-health visit to limit community exposure during the Covid-19 pandemic. I spoke with the patient via phone to discuss instructions. The patient was advised to review the section on consent for treatment as well. The patient will receive a phone call 2-3 days prior to their E-Visit at which time consent will be verbally confirmed. A Virtual Office Visit appointment type has been scheduled for 01/08/2019 with Darylene Price FNP.  Gaylord Shih, [place] 01/02/2019 1:27 PM

## 2019-01-08 ENCOUNTER — Other Ambulatory Visit: Payer: Self-pay

## 2019-01-08 ENCOUNTER — Ambulatory Visit: Payer: Medicare Other | Attending: Family | Admitting: Family

## 2019-01-08 ENCOUNTER — Encounter: Payer: Self-pay | Admitting: Family

## 2019-01-08 VITALS — Wt 184.0 lb

## 2019-01-08 DIAGNOSIS — I10 Essential (primary) hypertension: Secondary | ICD-10-CM

## 2019-01-08 DIAGNOSIS — I5032 Chronic diastolic (congestive) heart failure: Secondary | ICD-10-CM

## 2019-01-08 NOTE — Progress Notes (Signed)
Virtual Visit via Telephone Note    Evaluation Performed:  Follow-up visit  This visit type was conducted due to national recommendations for restrictions regarding the COVID-19 Pandemic (e.g. social distancing).  This format is felt to be most appropriate for this patient at this time.  All issues noted in this document were discussed and addressed.  No physical exam was performed (except for noted visual exam findings with Video Visits).  Please refer to the patient's chart (MyChart message for video visits and phone note for telephone visits) for the patient's consent to telehealth for [facility]  Date:  01/08/2019   ID:  Ashley Bird, DOB Dec 08, 1935, MRN [id number]  Patient Location:  [address] Ashley Bird [address]   Provider location:   [facility] [place] [address], [place] [address]  PCP:  Maryland Pink, MD  Cardiologist:  Doristine Mango, [place] Electrophysiologist:  None   Chief Complaint: fatigue   History of Present Illness:    Ashley Bird is a 83 y.o. female who presents via audio/video conferencing for a telehealth visit today.  Patient verified DOB and address.  The patient does not have symptoms concerning for COVID-19 infection (fever, chills, cough, or new SHORTNESS OF BREATH).   Patent reports minimal fatigue upon moderate exertion. She describes this as chronic in nature having been present for several years. She has associated head congestion along with this. She denies any dizziness, swelling in her legs/ abdomen, palpitations, chest pain, shortness of breath, difficulty sleeping or weight gain.   Prior CV studies:   The following studies were reviewed today:  Echo report from 05/02/18 reviewed and showed an EF of 55-65% along with moderate AS, mild MR and a PA pressure of 41 mm Hg.   Past Medical History:  Diagnosis Date  . CAD (coronary artery disease)   . CHF (congestive heart failure) ([place])   . COPD  (chronic obstructive pulmonary disease) ([place])   . Diabetes ([place])   . HLD (hyperlipidemia)   . HTN (hypertension)   . PAF (paroxysmal atrial fibrillation) ([place])    Past Surgical History:  Procedure Laterality Date  . ABDOMINAL HYSTERECTOMY    . BREAST EXCISIONAL BIOPSY Left 40 yrs ago   neg  . BREAST EXCISIONAL BIOPSY Left 40 yrs ago   neg     Current Meds  Medication Sig  . acetaminophen (TYLENOL) 325 MG tablet Take 325 mg by mouth every 6 (six) hours as needed.  Marland Kitchen ADVAIR DISKUS 100-50 MCG/DOSE AEPB Inhale 1 puff into the lungs every 12 (twelve) hours.  Marland Kitchen albuterol (ACCUNEB) 1.25 MG/3ML nebulizer solution Inhale into the lungs.  Marland Kitchen albuterol (PROVENTIL HFA;VENTOLIN HFA) 108 (90 Base) MCG/ACT inhaler Inhale into the lungs.  Marland Kitchen amiodarone (PACERONE) 200 MG tablet Take 200 mg by mouth daily.  Marland Kitchen b complex vitamins tablet Take 1 tablet by mouth daily.  . calcium carbonate (OSCAL) 1500 (600 Ca) MG TABS tablet Take 600 mg of elemental calcium by mouth daily with breakfast.  . cetirizine (ZYRTEC) 10 MG tablet Take 10 mg by mouth daily.  . Cholecalciferol (VITAMIN D3) 5000 units TABS Take 1 tablet by mouth daily.  . Melatonin 1 MG TABS Take by mouth at bedtime as needed.  . metoprolol succinate (TOPROL-XL) 50 MG 24 hr tablet Take 50 mg by mouth daily.  . Multiple Vitamins-Minerals (MULTIVITAMIN GUMMIES WOMENS) CHEW Chew 1 tablet by mouth daily.  Marland Kitchen olmesartan-hydrochlorothiazide (BENICAR HCT) 40-25 MG tablet Take 1  tablet by mouth daily.  . traMADol (ULTRAM) 50 MG tablet TAKE 1 TO 2 TABLETS BY MOUTH EVERY 4 TO 6 HOURS AS NEEDED  . warfarin (COUMADIN) 2 MG tablet Take 2 mg by mouth daily. On Monday     Allergies:   Alendronate, Doxycycline, Effexor [venlafaxine], Ferralet [iron-folic [id number]], Ferrous sulfate, Penicillins, Prozac [fluoxetine], Simvastatin, and Zoloft [sertraline hcl]   Social History   Tobacco Use  . Smoking status: Former Smoker    Packs/day: 2.00    Years:  30.00    Pack years: 60.00    Types: Cigarettes    Quit date: 05/1979    Years since quitting: 39.6  . Smokeless tobacco: Never Used  Substance Use Topics  . Alcohol use: Not Currently  . Drug use: Never     Family Hx: The patient's family history includes Asthma in her father; Colon cancer in her mother; Diabetes in her mother.  ROS:   Please see the history of present illness.     All other systems reviewed and are negative.   Labs/Other Tests and Data Reviewed:    Recent Labs: 05/01/2018: ALT 50; B Natriuretic Peptide 991.0 05/03/2018: BUN 37; Creatinine, Ser 1.91; Hemoglobin 10.4; Magnesium 1.7; Platelets 167; Potassium 4.1; Sodium 131   Recent Lipid Panel No results found for: CHOL, TRIG, HDL, CHOLHDL, LDLCALC, LDLDIRECT  Wt Readings from Last 3 Encounters:  01/08/19 184 lb (83.5 kg)  07/20/18 190 lb 2 oz (86.2 kg)  07/17/18 189 lb 11.2 oz (86 kg)     Exam:    Vital Signs:  Wt 184 lb (83.5 kg) Comment: self-reported  BMI 33.65 kg/m    Well nourished, well developed female in no  acute distress.   ASSESSMENT & PLAN:    1. Chronic heart failure with preserved ejection fraction- - NYHA class II - euvolemic today based on patient's description of symptoms - weighing daily and she was reminded to call for an overnight weight gain of >2 pounds or a weekly weight gain of >5 pounds - not adding salt to her food and her daughter has been reading food labels for sodium content. Reminded to closely follow a 2000mg  sodium diet  - saw cardiology Minette Brine) 08/14/2018 - BNP 05/01/18 was 991.0 - saw pulmonology Alva Garnet) 10/12/2018  2: HTN- - not checking her BP at home - saw PCP Kary Kos) 10/17/2018 - BMP 06/26/18 reviewed and showed sodium 140, potassium 4.2, creatinine 2.1 and GFR 23   COVID-19 Education: The signs and symptoms of COVID-19 were discussed with the patient and how to seek care for testing (follow up with PCP or arrange E-visit).  The importance of  social distancing was discussed today.  Patient Risk:   After full review of this patients clinical status, I feel that they are at least moderate risk at this time.  Time:   Today, I have spent 10 minutes with the patient with telehealth technology discussing medications, diet and symptoms to report.     Medication Adjustments/Labs and Tests Ordered: Current medicines are reviewed at length with the patient today. Concerns regarding medicines are outlined above.   Tests Ordered: No orders of the defined types were placed in this encounter.  Medication Changes: No orders of the defined types were placed in this encounter.   Disposition:  Follow-up in 3 months or sooner for any questions/problems before then.   Signed, Alisa Graff, FNP  01/08/2019 1:58 PM    [facility]

## 2019-01-08 NOTE — Patient Instructions (Signed)
Continue weighing daily and call for an overnight weight gain of > 2 pounds or a weekly weight gain of >5 pounds. 

## 2019-01-09 ENCOUNTER — Telehealth: Payer: Self-pay | Admitting: Nurse Practitioner

## 2019-01-09 NOTE — Telephone Encounter (Signed)
I called Ashley Bird for scheduled PC f/u visit, no answer and message left with contact information to return call.

## 2019-01-10 ENCOUNTER — Telehealth: Payer: Self-pay | Admitting: Nurse Practitioner

## 2019-01-10 NOTE — Telephone Encounter (Signed)
Called patient to reschedule the f/u visit that was scheduled for 01/09/19, no answer.  Left message with reason for call along with my name and contact number.

## 2019-01-19 ENCOUNTER — Telehealth: Payer: Self-pay | Admitting: Nurse Practitioner

## 2019-01-19 NOTE — Telephone Encounter (Signed)
Spoke with patient and have rescheduled the 01/09/19 f/u visit to 01/29/19 @ 11 AM, this will be a Telephone visit due to [place] not working properly

## 2019-01-29 ENCOUNTER — Encounter: Payer: Self-pay | Admitting: Nurse Practitioner

## 2019-01-29 ENCOUNTER — Other Ambulatory Visit: Payer: Self-pay

## 2019-01-29 ENCOUNTER — Other Ambulatory Visit: Payer: Medicare Other | Admitting: Nurse Practitioner

## 2019-01-29 DIAGNOSIS — R0602 Shortness of breath: Secondary | ICD-10-CM

## 2019-01-29 DIAGNOSIS — Z515 Encounter for palliative care: Secondary | ICD-10-CM

## 2019-01-29 NOTE — Progress Notes (Signed)
[facility] Consult Note Telephone: [phone]  Fax: [phone]  PATIENT NAME: Ashley Bird DOB: [birth year] MRN: [id number]  PRIMARY CARE PROVIDER:   Maryland Pink, MD  REFERRING PROVIDER:  Maryland Pink, MD [address] [facility] [place],  [place] [address]  RESPONSIBLE PARTY:   Self  Due to the COVID-19 crisis, this visit was done via telemedicine/Telehealth is it by telephone as video equipment not functioning for Ashley. Bird from my office and it was initiated and consent by this patient and or family.  RECOMMENDATIONS and PLAN: 1.Palliative care encounter Z51.5; Palliative medicine team will continue to support patient, patient's family, and medical team. Visit consisted of counseling and education dealing with the complex and emotionally intense issues of symptom management and palliative care in the setting of serious and potentially life-threatening illness  2.Dyspneic R06.00 secondary toCOPDremain stable at present time.Continue CPAP, albuterol as needed, advair  ASSESSMENT:     I called Mr. Bieda for schedule palliative care visit. We talked about purpose of palliative care visit and Ashley Bird in agreement. We talked about symptoms of pain which she currently is not experiencing. We talked about shortness of breath what she shared has greatly improved. We talked about her appetite which has improved. She shared that her daughter does come over and they cook. She tries to stay at home away from others during. We talked about her Improvement in [facility] Ashley Bird for  self care. We talked about chronic disease progression. We talked about medical goals of care what she wishes to continue to treat what is treatable. We talked about role of palliative care in plan of care. Discussed at present time she does remain stable. Will follow up with palliative care and two months if needed or sooner should she declined. Ashley  Bird in agreement. Therapeutic listening and emotional support provided. Questions answered satisfaction  I spent 30 minutes providing this consultation,  from 11:00am to 11.:30am More than 50% of the time in this consultation was spent coordinating communication.   HISTORY OF PRESENT ILLNESS:  KELSIE ZABOROWSKI is a 83 y.o. year old female with multiple medical problems including Paroxysmal atrial fibrillation, COPD, congestive heart failure, coronary artery disease, diabetes, hyperlipidemia, hypertension, abdominal hysterectomy. Seen by congestive heart failure clinic with Darylene Price nurse practitioner [phone] / [phone] / 2020 with minimal fatigue upon moderate exertion. NYHA class ll euvolemic based on symptoms. Saw Dr. Gerilyn Nestle Cardiology 2/3 / 2020. Saw Dr. Alva Garnet pulmonologist 4 / 2 / 2020. Saw a primary provider Dr. Kary Kos 4 / 7 / 2020. Ashley Bird continues to reside at home. Palliative Care was asked to help to continue address goals of care.   CODE STATUS: DNR  PPS: 60% HOSPICE ELIGIBILITY/DIAGNOSIS: TBD  PAST MEDICAL HISTORY:  Past Medical History:  Diagnosis Date   CAD (coronary artery disease)    CHF (congestive heart failure) ([facility])    COPD (chronic obstructive pulmonary disease) ([facility])    Diabetes ([facility])    HLD (hyperlipidemia)    HTN (hypertension)    PAF (paroxysmal atrial fibrillation) ([facility])     SOCIAL HX:  Social History   Tobacco Use   Smoking status: Former Smoker    Packs/day: 2.00    Years: 30.00    Pack years: 60.00    Types: Cigarettes    Quit date: 05/1979    Years since quitting: 39.7   Smokeless tobacco: Never Used  Substance Use Topics   Alcohol  use: Not Currently    ALLERGIES:  Allergies  Allergen Reactions   Alendronate    Doxycycline Swelling    Swelling and hemmorage      Effexor [Venlafaxine] Other (See Comments)    Body ached    Ferralet [Iron-Folic [id number]] Nausea And Vomiting    Vomiting    Ferrous Sulfate Nausea And  Vomiting   Penicillins Swelling    Per pt: swelling and hemmorage      Prozac [Fluoxetine]    Simvastatin Other (See Comments)    Muscle pain    Zoloft [Sertraline Hcl]      PERTINENT MEDICATIONS:  Outpatient Encounter Medications as of 01/29/2019  Medication Sig   acetaminophen (TYLENOL) 325 MG tablet Take 325 mg by mouth every 6 (six) hours as needed.   ADVAIR DISKUS 100-50 MCG/DOSE AEPB Inhale 1 puff into the lungs every 12 (twelve) hours.   albuterol (ACCUNEB) 1.25 MG/3ML nebulizer solution Inhale into the lungs.   albuterol (PROVENTIL HFA;VENTOLIN HFA) 108 (90 Base) MCG/ACT inhaler Inhale into the lungs.   amiodarone (PACERONE) 200 MG tablet Take 200 mg by mouth daily.   b complex vitamins tablet Take 1 tablet by mouth daily.   calcium carbonate (OSCAL) 1500 (600 Ca) MG TABS tablet Take 600 mg of elemental calcium by mouth daily with breakfast.   cetirizine (ZYRTEC) 10 MG tablet Take 10 mg by mouth daily.   Cholecalciferol (VITAMIN D3) 5000 units TABS Take 1 tablet by mouth daily.   furosemide (LASIX) 20 MG tablet Take 20 mg by mouth as needed.    Melatonin 1 MG TABS Take by mouth at bedtime as needed.   metoprolol succinate (TOPROL-XL) 50 MG 24 hr tablet Take 50 mg by mouth daily.   Multiple Vitamins-Minerals (MULTIVITAMIN GUMMIES WOMENS) CHEW Chew 1 tablet by mouth daily.   olmesartan-hydrochlorothiazide (BENICAR HCT) 40-25 MG tablet Take 1 tablet by mouth daily.   traMADol (ULTRAM) 50 MG tablet TAKE 1 TO 2 TABLETS BY MOUTH EVERY 4 TO 6 HOURS AS NEEDED   warfarin (COUMADIN) 2 MG tablet Take 2 mg by mouth daily. On Monday   No facility-administered encounter medications on file as of 01/29/2019.     PHYSICAL EXAM:  Deferred  Tiearra Colwell Z Brettney Ficken, NP

## 2019-01-30 ENCOUNTER — Telehealth: Payer: Self-pay | Admitting: Nurse Practitioner

## 2019-01-30 NOTE — Telephone Encounter (Signed)
I called Ashley. Ashley Bird for update on DNR form as she currently is a DNR. She does have at a facility form [facility] in her home. It is not an epic [place]. Asked Ashley Bird if it was okay to complete a new DNR form and put in [place] of then mail her the copy. She was agreeable.  Total time spent 20 minutes  Documentation 5 minutes  Phone discussion 15 minutes

## 2019-03-08 ENCOUNTER — Encounter: Payer: Self-pay | Admitting: Nurse Practitioner

## 2019-03-08 ENCOUNTER — Other Ambulatory Visit: Payer: Self-pay

## 2019-03-08 ENCOUNTER — Other Ambulatory Visit: Payer: Medicare Other | Admitting: Nurse Practitioner

## 2019-03-08 DIAGNOSIS — Z515 Encounter for palliative care: Secondary | ICD-10-CM

## 2019-03-08 NOTE — Progress Notes (Signed)
[facility] Consult Note Telephone: [phone]  Fax: [phone]  PATIENT NAME: [name] DOB: 1935/10/25 MRN: [id number]  PRIMARY CARE PROVIDER:   Maryland Pink, MD  REFERRING PROVIDER:  Maryland Pink, MD [address] [facility] [place],  [place] [address]  RESPONSIBLE PARTY:   Self  Due to the COVID-19 crisis, this visit was done via telemedicine/Telehealth is it by telephone as video equipment not functioning for Ashley Bird my office and it was initiated and consent by this patient and or family.  RECOMMENDATIONS and PLAN: 1.ACP: DNR; continue to treat what is treatable, avoid ICU, wishes are not to be intubated. Discussed MOST form and hard choice book, in agreement to have blank MOST form and hard choice book for review and will re-visit at next [facility] visit.   2.Dyspneic R06.00 secondary toCOPDremain stable at present time.Continue CPAP, albuterol as needed, advair  I spent 40 minutes providing this consultation,  from 10:00am to 10:40am. More than 50% of the time in this consultation was spent coordinating communication.   HISTORY OF PRESENT ILLNESS:  Ashley Bird is a 83 y.o. year old female with multiple medical problems including Paroxysmal atrial fibrillation, COPD, congestive heart failure, coronary artery disease, diabetes, hyperlipidemia, hypertension, abdominal hysterectomy. Seen by congestive heart failure clinic.  I called Ashley. Strack for scheduled palliative care follow up visit. Explain purpose of follow-up visit and Ashley Bird in agreement. We talked about how she was feeling. She sure that she's doing well. She denies complaints of shortness of breath or pain. We talked about her appetite which has been good. No recent exacerbations congestive heart failure. Edema has improved. We talked about her daily routine and she shared that she stays at home except for one day a week she goes to her daughters  were they all cook together. We talked about quality of life. DNR are already in place. We talked about medical goals of care reviewed most form. Will send Ashley Bird a blank copy of most form to review with her choice book and follow up with the next palliative care visit to complete. Ashley Bird and agreement she wants to review with her daughter. Ashley Bird and door says that she's going to learn to line dance that's on her list of things to do. Encouraged her to do things that bring her joy and improve her quality of life. We talked about role of palliative care and plan of care. Discuss that will follow up in 4 weeks to review most form and Ashley Bird in agreement. Ashley Bird continues to be stable at present time with no recent hospitalizations come infections, Falls, wounds. She is independent. Scheduled palliative care follow-up appointment set. Therapeutic listening and emotional support provided. Contact information provided. Questions answered to satisfaction. Palliative Care was asked to help to continue to address goals of care.   CODE STATUS: DNR  PPS: 60% HOSPICE ELIGIBILITY/DIAGNOSIS: TBD  PAST MEDICAL HISTORY:  Past Medical History:  Diagnosis Date   CAD (coronary artery disease)    CHF (congestive heart failure) (H[facility])    COPD (chronic obstructive pulmonary disease) ([facility])    Diabetes ([place])    HLD (hyperlipidemia)    HTN (hypertension)    PAF (paroxysmal atrial fibrillation) ([place])     SOCIAL HX:  Social History   Tobacco Use   Smoking status: Former Smoker    Packs/day: 2.00    Years: 30.00    Pack years: 60.00  Types: Cigarettes    Quit date: 05/1979    Years since quitting: 39.8   Smokeless tobacco: Never Used  Substance Use Topics   Alcohol use: Not Currently    ALLERGIES:  Allergies  Allergen Reactions   Alendronate    Doxycycline Swelling    Swelling and hemmorage      Effexor [Venlafaxine] Other (See Comments)    Body ached    Ferralet  [Iron-Folic [id number] Nausea And Vomiting    Vomiting    Ferrous Sulfate Nausea And Vomiting   Penicillins Swelling    Per pt: swelling and hemmorage      Prozac [Fluoxetine]    Simvastatin Other (See Comments)    Muscle pain    Zoloft [Sertraline Hcl]      PERTINENT MEDICATIONS:  Outpatient Encounter Medications as of 03/08/2019  Medication Sig   acetaminophen (TYLENOL) 325 MG tablet Take 325 mg by mouth every 6 (six) hours as needed.   ADVAIR DISKUS 100-50 MCG/DOSE AEPB Inhale 1 puff into the lungs every 12 (twelve) hours.   albuterol (ACCUNEB) 1.25 MG/3ML nebulizer solution Inhale into the lungs.   albuterol (PROVENTIL HFA;VENTOLIN HFA) 108 (90 Base) MCG/ACT inhaler Inhale into the lungs.   amiodarone (PACERONE) 200 MG tablet Take 200 mg by mouth daily.   b complex vitamins tablet Take 1 tablet by mouth daily.   calcium carbonate (OSCAL) 1500 (600 Ca) MG TABS tablet Take 600 mg of elemental calcium by mouth daily with breakfast.   cetirizine (ZYRTEC) 10 MG tablet Take 10 mg by mouth daily.   Cholecalciferol (VITAMIN D3) 5000 units TABS Take 1 tablet by mouth daily.   furosemide (LASIX) 20 MG tablet Take 20 mg by mouth as needed.    Melatonin 1 MG TABS Take by mouth at bedtime as needed.   metoprolol succinate (TOPROL-XL) 50 MG 24 hr tablet Take 50 mg by mouth daily.   Multiple Vitamins-Minerals (MULTIVITAMIN GUMMIES WOMENS) CHEW Chew 1 tablet by mouth daily.   olmesartan-hydrochlorothiazide (BENICAR HCT) 40-25 MG tablet Take 1 tablet by mouth daily.   traMADol (ULTRAM) 50 MG tablet TAKE 1 TO 2 TABLETS BY MOUTH EVERY 4 TO 6 HOURS AS NEEDED   warfarin (COUMADIN) 2 MG tablet Take 2 mg by mouth daily. On Monday   No facility-administered encounter medications on file as of 03/08/2019.     PHYSICAL EXAM:   Deferred  Leo Weyandt Z Andrews Tener, NP

## 2019-03-21 ENCOUNTER — Telehealth: Payer: Self-pay

## 2019-03-21 NOTE — Telephone Encounter (Signed)
Received message from patient requesting a call back regarding her pulse rate. Returned call, no answer, VM box full.

## 2019-03-22 ENCOUNTER — Emergency Department: Payer: Medicare Other

## 2019-03-22 ENCOUNTER — Other Ambulatory Visit: Payer: Self-pay

## 2019-03-22 ENCOUNTER — Inpatient Hospital Stay
Admission: EM | Admit: 2019-03-22 | Discharge: 2019-03-24 | DRG: 190 | Disposition: A | Discharge status: Home Health | Payer: Medicare Other | Attending: Internal Medicine | Admitting: Internal Medicine

## 2019-03-22 DIAGNOSIS — Z880 Allergy status to penicillin: Secondary | ICD-10-CM

## 2019-03-22 DIAGNOSIS — I454 Nonspecific intraventricular block: Secondary | ICD-10-CM | POA: Diagnosis present

## 2019-03-22 DIAGNOSIS — Z833 Family history of diabetes mellitus: Secondary | ICD-10-CM | POA: Diagnosis not present

## 2019-03-22 DIAGNOSIS — Z79899 Other long term (current) drug therapy: Secondary | ICD-10-CM

## 2019-03-22 DIAGNOSIS — Z7951 Long term (current) use of inhaled steroids: Secondary | ICD-10-CM

## 2019-03-22 DIAGNOSIS — Z882 Allergy status to sulfonamides status: Secondary | ICD-10-CM

## 2019-03-22 DIAGNOSIS — I130 Hypertensive heart and chronic kidney disease with heart failure and stage 1 through stage 4 chronic kidney disease, or unspecified chronic kidney disease: Secondary | ICD-10-CM | POA: Diagnosis present

## 2019-03-22 DIAGNOSIS — I480 Paroxysmal atrial fibrillation: Secondary | ICD-10-CM | POA: Diagnosis present

## 2019-03-22 DIAGNOSIS — Z888 Allergy status to other drugs, medicaments and biological substances status: Secondary | ICD-10-CM

## 2019-03-22 DIAGNOSIS — J209 Acute bronchitis, unspecified: Secondary | ICD-10-CM | POA: Diagnosis present

## 2019-03-22 DIAGNOSIS — J441 Chronic obstructive pulmonary disease with (acute) exacerbation: Secondary | ICD-10-CM | POA: Diagnosis present

## 2019-03-22 DIAGNOSIS — J9601 Acute respiratory failure with hypoxia: Secondary | ICD-10-CM | POA: Diagnosis present

## 2019-03-22 DIAGNOSIS — Z8701 Personal history of pneumonia (recurrent): Secondary | ICD-10-CM

## 2019-03-22 DIAGNOSIS — I5033 Acute on chronic diastolic (congestive) heart failure: Secondary | ICD-10-CM | POA: Diagnosis present

## 2019-03-22 DIAGNOSIS — J440 Chronic obstructive pulmonary disease with acute lower respiratory infection: Secondary | ICD-10-CM | POA: Diagnosis present

## 2019-03-22 DIAGNOSIS — E1122 Type 2 diabetes mellitus with diabetic chronic kidney disease: Secondary | ICD-10-CM | POA: Diagnosis present

## 2019-03-22 DIAGNOSIS — Z87891 Personal history of nicotine dependence: Secondary | ICD-10-CM

## 2019-03-22 DIAGNOSIS — Z20828 Contact with and (suspected) exposure to other viral communicable diseases: Secondary | ICD-10-CM | POA: Diagnosis present

## 2019-03-22 DIAGNOSIS — Z825 Family history of asthma and other chronic lower respiratory diseases: Secondary | ICD-10-CM

## 2019-03-22 DIAGNOSIS — N183 Chronic kidney disease, stage 3 (moderate): Secondary | ICD-10-CM | POA: Diagnosis present

## 2019-03-22 DIAGNOSIS — E785 Hyperlipidemia, unspecified: Secondary | ICD-10-CM | POA: Diagnosis present

## 2019-03-22 DIAGNOSIS — Z7901 Long term (current) use of anticoagulants: Secondary | ICD-10-CM

## 2019-03-22 DIAGNOSIS — I2510 Atherosclerotic heart disease of native coronary artery without angina pectoris: Secondary | ICD-10-CM | POA: Diagnosis present

## 2019-03-22 DIAGNOSIS — Z66 Do not resuscitate: Secondary | ICD-10-CM | POA: Diagnosis not present

## 2019-03-22 DIAGNOSIS — Z881 Allergy status to other antibiotic agents status: Secondary | ICD-10-CM | POA: Diagnosis not present

## 2019-03-22 LAB — BASIC METABOLIC PANEL
Anion gap: 11 (ref 5–15)
BUN: 33 mg/dL — ABNORMAL HIGH (ref 8–23)
CO2: 24 mmol/L (ref 22–32)
Calcium: 9.4 mg/dL (ref 8.9–10.3)
Chloride: 103 mmol/L (ref 98–111)
Creatinine, Ser: 1.72 mg/dL — ABNORMAL HIGH (ref 0.44–1.00)
GFR calc Af Amer: 32 mL/min — ABNORMAL LOW (ref >60)
GFR calc non Af Amer: 27 mL/min — ABNORMAL LOW (ref >60)
Glucose, Bld: 177 mg/dL — ABNORMAL HIGH (ref 70–99)
Potassium: 4.4 mmol/L (ref 3.5–5.1)
Sodium: 138 mmol/L (ref 135–145)

## 2019-03-22 LAB — CBC
HCT: 39 % (ref 36.0–46.0)
Hemoglobin: 11.9 g/dL — ABNORMAL LOW (ref 12.0–15.0)
MCH: 27.2 pg (ref 26.0–34.0)
MCHC: 30.5 g/dL (ref 30.0–36.0)
MCV: 89.2 fL (ref 80.0–100.0)
Platelets: 257 10*3/uL (ref 150–400)
RBC: 4.37 MIL/uL (ref 3.87–5.11)
RDW: 15.1 % (ref 11.5–15.5)
WBC: 14.3 10*3/uL — ABNORMAL HIGH (ref 4.0–10.5)
nRBC: 0 % (ref 0.0–0.2)

## 2019-03-22 LAB — PROTIME-INR
INR: 2.9 — ABNORMAL HIGH (ref 0.8–1.2)
Prothrombin Time: 29.7 seconds — ABNORMAL HIGH (ref 11.4–15.2)

## 2019-03-22 LAB — TROPONIN I (HIGH SENSITIVITY)
Troponin I (High Sensitivity): 21 ng/L — ABNORMAL HIGH (ref <18)
Troponin I (High Sensitivity): 24 ng/L — ABNORMAL HIGH (ref <18)

## 2019-03-22 LAB — SARS CORONAVIRUS 2 BY RT PCR (HOSPITAL ORDER, PERFORMED IN ~~LOC~~ HOSPITAL LAB): SARS Coronavirus 2: NEGATIVE

## 2019-03-22 MED ORDER — ONDANSETRON HCL 4 MG PO TABS
4.0000 mg | ORAL_TABLET | Freq: Four times a day (QID) | ORAL | Status: DC | PRN
  Filled 2019-03-22: qty 1

## 2019-03-22 MED ORDER — IPRATROPIUM-ALBUTEROL 0.5-2.5 (3) MG/3ML IN SOLN
RESPIRATORY_TRACT | Status: AC
  Administered 2019-03-22: 03:00:00 3 mL
  Filled 2019-03-22: qty 6

## 2019-03-22 MED ORDER — ACETAMINOPHEN 650 MG RE SUPP
650.0000 mg | Freq: Four times a day (QID) | RECTAL | Status: DC | PRN
  Filled 2019-03-22: qty 1

## 2019-03-22 MED ORDER — ADULT MULTIVITAMIN W/MINERALS CH
1.0000 | ORAL_TABLET | Freq: Every day | ORAL | Status: DC
  Administered 2019-03-22 – 2019-03-24 (×3): 1 via ORAL
  Filled 2019-03-22 (×4): qty 1

## 2019-03-22 MED ORDER — WARFARIN - PHARMACIST DOSING INPATIENT
Freq: Every day | Status: DC
  Administered 2019-03-22: 19:00:00
  Filled 2019-03-22: qty 1

## 2019-03-22 MED ORDER — VITAMIN D 25 MCG (1000 UNIT) PO TABS
1000.0000 [IU] | ORAL_TABLET | Freq: Every day | ORAL | Status: DC
  Administered 2019-03-22 – 2019-03-24 (×3): 1000 [IU] via ORAL
  Filled 2019-03-22 (×4): qty 1

## 2019-03-22 MED ORDER — CALCIUM CARBONATE-VITAMIN D 500-200 MG-UNIT PO TABS
1.0000 | ORAL_TABLET | Freq: Every day | ORAL | Status: DC
  Administered 2019-03-22 – 2019-03-23 (×2): 1 via ORAL
  Filled 2019-03-22 (×3): qty 1

## 2019-03-22 MED ORDER — MULTIVITAMIN GUMMIES WOMENS PO CHEW: 1.0000 | CHEWABLE_TABLET | Freq: Every day | ORAL | Status: DC

## 2019-03-22 MED ORDER — IPRATROPIUM-ALBUTEROL 0.5-2.5 (3) MG/3ML IN SOLN
3.0000 mL | Freq: Once | RESPIRATORY_TRACT | Status: AC
  Administered 2019-03-22: 3 mL via RESPIRATORY_TRACT

## 2019-03-22 MED ORDER — WARFARIN SODIUM 1 MG PO TABS
1.0000 mg | ORAL_TABLET | Freq: Once | ORAL | Status: AC
  Administered 2019-03-22: 1 mg via ORAL
  Filled 2019-03-22: qty 1

## 2019-03-22 MED ORDER — ACETAMINOPHEN 325 MG PO TABS
650.0000 mg | ORAL_TABLET | Freq: Four times a day (QID) | ORAL | Status: DC | PRN
  Administered 2019-03-22 – 2019-03-23 (×2): 650 mg via ORAL
  Filled 2019-03-22 (×3): qty 2

## 2019-03-22 MED ORDER — METOPROLOL SUCCINATE ER 50 MG PO TB24
50.0000 mg | ORAL_TABLET | Freq: Every day | ORAL | Status: DC
  Administered 2019-03-22 – 2019-03-24 (×3): 50 mg via ORAL
  Filled 2019-03-22 (×3): qty 1

## 2019-03-22 MED ORDER — METHYLPREDNISOLONE SODIUM SUCC 125 MG IJ SOLR
60.0000 mg | Freq: Three times a day (TID) | INTRAMUSCULAR | Status: AC
  Administered 2019-03-22 (×3): 60 mg via INTRAVENOUS
  Filled 2019-03-22 (×2): qty 0.96
  Filled 2019-03-22 (×2): qty 2

## 2019-03-22 MED ORDER — WARFARIN SODIUM 2 MG PO TABS: 2.0000 mg | ORAL_TABLET | Freq: Every day | ORAL | Status: DC

## 2019-03-22 MED ORDER — AMIODARONE HCL 200 MG PO TABS
200.0000 mg | ORAL_TABLET | Freq: Every day | ORAL | Status: DC
  Administered 2019-03-22 – 2019-03-24 (×3): 200 mg via ORAL
  Filled 2019-03-22 (×3): qty 1

## 2019-03-22 MED ORDER — TRAMADOL HCL 50 MG PO TABS
50.0000 mg | ORAL_TABLET | Freq: Two times a day (BID) | ORAL | Status: DC | PRN
  Administered 2019-03-22 – 2019-03-23 (×2): 50 mg via ORAL
  Filled 2019-03-22 (×3): qty 1

## 2019-03-22 MED ORDER — MAGNESIUM HYDROXIDE 400 MG/5ML PO SUSP: 30.0000 mL | Freq: Every day | ORAL | Status: DC | PRN

## 2019-03-22 MED ORDER — ONDANSETRON HCL 4 MG/2ML IJ SOLN: 4.0000 mg | Freq: Four times a day (QID) | INTRAMUSCULAR | Status: DC | PRN

## 2019-03-22 MED ORDER — METHYLPREDNISOLONE SODIUM SUCC 125 MG IJ SOLR
125.0000 mg | Freq: Once | INTRAMUSCULAR | Status: AC
  Administered 2019-03-22: 125 mg via INTRAVENOUS
  Filled 2019-03-22: qty 2

## 2019-03-22 MED ORDER — METHYLPREDNISOLONE SODIUM SUCC 125 MG IJ SOLR
INTRAMUSCULAR | Status: AC
  Filled 2019-03-22: qty 2

## 2019-03-22 MED ORDER — IPRATROPIUM-ALBUTEROL 0.5-2.5 (3) MG/3ML IN SOLN
3.0000 mL | Freq: Four times a day (QID) | RESPIRATORY_TRACT | Status: DC
  Administered 2019-03-22 – 2019-03-23 (×4): 3 mL via RESPIRATORY_TRACT
  Filled 2019-03-22 (×5): qty 3

## 2019-03-22 MED ORDER — FUROSEMIDE 10 MG/ML IJ SOLN
20.0000 mg | Freq: Two times a day (BID) | INTRAMUSCULAR | Status: DC
  Administered 2019-03-22 – 2019-03-24 (×4): 20 mg via INTRAVENOUS
  Filled 2019-03-22 (×4): qty 2

## 2019-03-22 MED ORDER — SODIUM CHLORIDE 0.9 % IV SOLN: 500.0000 mg | INTRAVENOUS | Status: DC

## 2019-03-22 MED ORDER — MELATONIN 5 MG PO TABS
2.5000 mg | ORAL_TABLET | Freq: Every evening | ORAL | Status: DC | PRN
  Filled 2019-03-22: qty 0.5

## 2019-03-22 MED ORDER — OLMESARTAN MEDOXOMIL-HCTZ 40-25 MG PO TABS: 1.0000 | ORAL_TABLET | Freq: Every day | ORAL | Status: DC

## 2019-03-22 MED ORDER — IRBESARTAN 150 MG PO TABS
300.0000 mg | ORAL_TABLET | Freq: Every day | ORAL | Status: DC
  Administered 2019-03-23 – 2019-03-24 (×2): 300 mg via ORAL
  Filled 2019-03-22 (×2): qty 2

## 2019-03-22 MED ORDER — HYDROCHLOROTHIAZIDE 25 MG PO TABS
25.0000 mg | ORAL_TABLET | Freq: Every day | ORAL | Status: DC
  Administered 2019-03-22 – 2019-03-24 (×3): 25 mg via ORAL
  Filled 2019-03-22 (×3): qty 1

## 2019-03-22 MED ORDER — PREDNISONE 20 MG PO TABS
40.0000 mg | ORAL_TABLET | Freq: Every day | ORAL | Status: DC
  Administered 2019-03-23 – 2019-03-24 (×2): 40 mg via ORAL
  Filled 2019-03-22 (×2): qty 2

## 2019-03-22 MED ORDER — LORATADINE 10 MG PO TABS
10.0000 mg | ORAL_TABLET | Freq: Every day | ORAL | Status: DC
  Administered 2019-03-22 – 2019-03-24 (×3): 10 mg via ORAL
  Filled 2019-03-22 (×4): qty 1

## 2019-03-22 MED ORDER — ENSURE MAX PROTEIN PO LIQD
11.0000 [oz_av] | Freq: Every day | ORAL | Status: DC
  Administered 2019-03-23: 11 [oz_av] via ORAL
  Filled 2019-03-22: qty 330

## 2019-03-22 MED ORDER — SODIUM CHLORIDE 0.9 % IV SOLN
1.0000 g | INTRAVENOUS | Status: DC
  Administered 2019-03-22 – 2019-03-23 (×2): 1 g via INTRAVENOUS
  Filled 2019-03-22: qty 1

## 2019-03-22 NOTE — ED Notes (Signed)
Patient up to use restroom. Patient ambulatory with standby assistance. Given ginger ale to drink. Patient's daughter at bedside.

## 2019-03-22 NOTE — ED Notes (Signed)
Pt is MODERATE/HIGH FALL RISK. [place] alarm activated, yellow bracelet placed on pt's right wrist, and yellow slip-resistant socks applied at this time.

## 2019-03-22 NOTE — ED Triage Notes (Addendum)
Pt arrives to ED via [facility] from home with c/o centralized, non-radiating CP and SHOB x2 days that has progressively worsened over the last 24 hrs. EMS reports pt was given 324 aspirin en route with some relief of pain. EMS states pt's 12 lead showed a-fib with RVR; HR variable between 83 and 122 bpm.  Initial sats on RA was 90%; improved to 98% on 4L via [place]. Pt is A&O, in NAD; RR even, regular, but labored. Dr Owens Shark at bedside upon pt's arrival to ED.

## 2019-03-22 NOTE — Evaluation (Signed)
Occupational Therapy Evaluation Patient Details Name: Ashley Bird MRN: [id number] DOB: [birth year] Today's Date: 03/22/2019    History of Present Illness Ashley Bird  is a 83 y.o. Caucasian female with a known history of multiple medical problems including COPD, CAD, CHF and type diabetes mellitus, presented to the emergency room with concern of worsening dyspnea over the last couple of days with associated wheezing without significant cough.  She has been having orthopnea over the last couple of days with dyspnea on exertion and paroxysmal nocturnal dyspnea.   Clinical Impression   Pt is  83 year old female who presents to [facility] with worsening dyspnea with wheezing and now on 2L nasal cannula which is not her baseline (no O2 use at home).  She lives in an apartment on the opposite side of her daughter who is available to help as needed.  She has a hx of COPD and has all adaptive equipment for ADLs  If needed but is currently at baseline and able to complete without any AD and no changes in O2 sats or BP.  She was not using any AD for ambulation prior to admission but has a quad cane and a rollator.  Reviewed handout about energy conserv tech and purse lip breathing.  Pt seen for OT eval only and no further OT needed.        Follow Up Recommendations  No OT follow up    Equipment Recommendations       Recommendations for Other Services       Precautions / Restrictions Precautions Precautions: Fall Restrictions Weight Bearing Restrictions: No      Mobility Bed Mobility                  Transfers                      Balance                                           ADL either performed or assessed with clinical judgement   ADL Overall ADL's : At baseline                                       General ADL Comments: Pt is at baseline for ADLs and is able to complete with rest breaks as needed.  Her O2 sats and BP, HR  were stable from lying to sitting on EOB on 2L nasal cannula.  She has hx of COPD and has reacher, sock aid and LH shoe horn at home if she needs them.  Reviewed handout for energy conservation tech.     Vision Baseline Vision/History: Wears glasses Wears Glasses: At all times Patient Visual Report: No change from baseline       Perception     Praxis      Pertinent Vitals/Pain Pain Assessment: No/denies pain     Hand Dominance Right   Extremity/Trunk Assessment Upper Extremity Assessment Upper Extremity Assessment: Overall WFL for tasks assessed   Lower Extremity Assessment Lower Extremity Assessment: Defer to PT evaluation       Communication Communication Communication: No difficulties   Cognition Arousal/Alertness: Awake/alert Behavior During Therapy: WFL for tasks assessed/performed Overall Cognitive Status: Within Functional Limits for tasks assessed  General Comments       Exercises     Shoulder Instructions      Home Living Family/patient expects to be discharged to:: Private residence Living Arrangements: Alone Available Help at Discharge: Family Type of Home: Apartment(apartment on opposite side of house from her daughter)       Home Layout: One level     Bathroom Shower/Tub: Walk-in Hydrologist: Handicapped height(BSC over toilet in place at home) Bathroom Accessibility: Yes How Accessible: Other (comment)(by quad cane) Home Equipment: Cane - quad;Shower seat;Bedside commode;Other (comment)(rollator)          Prior Functioning/Environment Level of Independence: Independent        Comments: Pt was ambulating at home without any AD and used a quad cane for doctor appointments and generally does not leave the home much any more.        OT Problem List: Cardiopulmonary status limiting activity      OT Treatment/Interventions:      OT Goals(Current goals can be  found in the care plan section) Acute Rehab OT Goals Patient Stated Goal: Pt seen for OT eval only so no goals set  OT Frequency:     Barriers to D/C:            Co-evaluation              AM-PAC OT "6 Clicks" Daily Activity     Outcome Measure Help from another person eating meals?: None Help from another person taking care of personal grooming?: None Help from another person toileting, which includes using toliet, bedpan, or urinal?: A Little Help from another person bathing (including washing, rinsing, drying)?: A Little Help from another person to put on and taking off regular upper body clothing?: None Help from another person to put on and taking off regular lower body clothing?: None 6 Click Score: 22   End of Session    Activity Tolerance: Patient tolerated treatment well Patient left: in bed;with call bell/phone within reach;with nursing/sitter in room(CNA in room with her while she was sitting EOB and answering admission questions)  OT Visit Diagnosis: Other abnormalities of gait and mobility (R26.89)                Time: 1525-1600 OT Time Calculation (min): 35 min Charges:  OT General Charges $OT Visit: 1 Visit OT Evaluation $OT Eval Low Complexity: 1 Low OT Treatments $Self Care/Home Management : 8-22 mins $Therapeutic Activity: 8-22 mins  Chrys Racer, OTR/L, [place] ascom [phone] 03/22/19, 4:13 PM

## 2019-03-22 NOTE — Progress Notes (Signed)
[employer] called regarding patient's grandson. Per [employer] this patient has a grandson in the [employer] and is asking for permission to come see patient. This RN explained that I could not give out any information due to HIPPA but would get contact information and let MD call them. MD notified. Contact info : [phone] reference number [id number].

## 2019-03-22 NOTE — Plan of Care (Signed)
  Problem: Clinical Measurements: Goal: Respiratory complications will improve Outcome: Progressing Goal: Cardiovascular complication will be avoided Outcome: Progressing   Problem: Activity: Goal: Risk for activity intolerance will decrease Outcome: Progressing   Problem: Skin Integrity: Goal: Risk for impaired skin integrity will decrease Outcome: Progressing

## 2019-03-22 NOTE — ED Notes (Signed)
Per Dr Sidney Ace, okay to only collect one set of cultures d/t pt already having 2 IVs.

## 2019-03-22 NOTE — ED Notes (Signed)
Attempted to call report; nurse is at lunch and no one can take report; they will call me back.

## 2019-03-22 NOTE — ED Notes (Signed)
Only one set of blood cultures collected d/t pt already having 2 IVs and antibiotics ordered to start at this time.

## 2019-03-22 NOTE — H&P (Addendum)
[facility] [place] NAME: Ashley Bird    MR#:  [id number]  DATE OF BIRTH:  [birth year]  DATE OF ADMISSION:  03/22/2019  PRIMARY CARE PHYSICIAN: Maryland Pink, MD   REQUESTING/REFERRING PHYSICIAN: Marjean Donna, MD  CHIEF COMPLAINT:   Chief Complaint  Patient presents with  . Chest Pain  . Shortness of Breath    HISTORY OF PRESENT ILLNESS:  Ashley Bird  is a 83 y.o. Caucasian female with a known history of multiple medical problems that will be mentioned below including COPD, CAD, CHF and type diabetes mellitus, presented to the emergency room with concern of worsening dyspnea over the last couple of days with associated wheezing without significant cough.  She has been having orthopnea over the last couple of days with dyspnea on exertion and paroxysmal nocturnal dyspnea.  No fever or chills.  She has been having occasional chest pain feeling like cold.  No dysuria, oliguria or hematuria or flank pain.  No bleeding diathesis.  Upon presentation in the emergency room, respiratory to is 23 and pulse continues 100% on 4 L of O2 by nasal cannula with otherwise normal vital signs.  Labs revealed a BUN of 33 and creatinine 1.72 and leukocytosis of 14.3.  COVID-19 test came back negative.  Portable chest x-ray showed cardiomegaly and pulmonary vascular congestion atrial fibrillation with left ventricular spots of 111 with IVCD/incomplete bundle branch block.  The patient was given duo nebs and 125 mg of IV Solu-Medrol.  She will be admitted to a telemetry bed for further evaluation and management.  PAST MEDICAL HISTORY:   Past Medical History:  Diagnosis Date  . CAD (coronary artery disease)   . CHF (congestive heart failure) ([place])   . COPD (chronic obstructive pulmonary disease) ([place])   . Diabetes ([place])   . HLD (hyperlipidemia)   . HTN (hypertension)   . PAF (paroxysmal atrial fibrillation) ([place])     PAST SURGICAL HISTORY:   Past  Surgical History:  Procedure Laterality Date  . ABDOMINAL HYSTERECTOMY    . BREAST EXCISIONAL BIOPSY Left 40 yrs ago   neg  . BREAST EXCISIONAL BIOPSY Left 40 yrs ago   neg    SOCIAL HISTORY:   Social History   Tobacco Use  . Smoking status: Former Smoker    Packs/day: 2.00    Years: 30.00    Pack years: 60.00    Types: Cigarettes    Quit date: 05/1979    Years since quitting: 39.8  . Smokeless tobacco: Never Used  Substance Use Topics  . Alcohol use: Not Currently    FAMILY HISTORY:   Family History  Problem Relation Age of Onset  . Colon cancer Mother   . Diabetes Mother   . Asthma Father     DRUG ALLERGIES:   Allergies  Allergen Reactions  . Alendronate Other (See Comments)    Reaction: unknown  . Doxycycline Swelling    Swelling and hemmorage     . Effexor [Venlafaxine] Other (See Comments)    Body ached   . Ferralet [Iron-Folic 123456 Nausea And Vomiting    Vomiting   . Ferrous Sulfate Nausea And Vomiting  . Penicillins Swelling    Per pt: swelling and hemmorage     . Prozac [Fluoxetine] Other (See Comments)    Reaction: unknown  . Simvastatin Other (See Comments)    Muscle pain   . Sulfa Antibiotics Other (See Comments)    Reaction: unknown  .  Zoloft [Sertraline Hcl] Other (See Comments)    Reaction: unknown    REVIEW OF SYSTEMS:   ROS As per history of present illness. All pertinent systems were reviewed above. Constitutional,  HEENT, cardiovascular, respiratory, GI, GU, musculoskeletal, neuro, psychiatric, endocrine,  integumentary and hematologic systems were reviewed and are otherwise  negative/unremarkable except for positive findings mentioned above in the HPI.   MEDICATIONS AT HOME:   Prior to Admission medications   Medication Sig Start Date End Date Taking? Authorizing Provider  acetaminophen (TYLENOL) 325 MG tablet Take 325 mg by mouth every 6 (six) hours as needed. Takes with Tramadol   Yes [provider]  ADVAIR DISKUS 100-50 MCG/DOSE AEPB Inhale 1 puff into the lungs every 12 (twelve) hours. 02/23/18  Yes [provider]  albuterol (ACCUNEB) 1.25 MG/3ML nebulizer solution Inhale 1 ampule into the lungs every 4 (four) hours as needed for wheezing or shortness of breath.  05/11/18 05/11/19 Yes [provider]  albuterol (PROVENTIL HFA;VENTOLIN HFA) 108 (90 Base) MCG/ACT inhaler Inhale 2 puffs into the lungs every 4 (four) hours as needed for wheezing or shortness of breath.  05/11/18 05/11/19 Yes [provider]  amiodarone (PACERONE) 200 MG tablet Take 200 mg by mouth daily. 03/03/18  Yes [provider]  b complex vitamins tablet Take 1 tablet by mouth daily.   Yes [provider]  calcium carbonate (OSCAL) 1500 (600 Ca) MG TABS tablet Take 600 mg of elemental calcium by mouth daily with breakfast.   Yes [provider]  cetirizine (ZYRTEC) 10 MG tablet Take 10 mg by mouth every other day.    Yes [provider]  Cholecalciferol (VITAMIN D3) 5000 units TABS Take 1 tablet by mouth daily.   Yes [provider]  furosemide (LASIX) 20 MG tablet Take 20 mg by mouth as needed for fluid.  05/12/18 05/12/19 Yes [provider]  Melatonin 1 MG TABS Take 1 mg by mouth at bedtime as needed (sleep).    Yes [provider]  metoprolol succinate (TOPROL-XL) 50 MG 24 hr tablet Take 50 mg by mouth daily. 05/01/18  Yes [provider]  Multiple Vitamins-Minerals (MULTIVITAMIN GUMMIES WOMENS) CHEW Chew 1 tablet by mouth daily.   Yes [provider]  olmesartan-hydrochlorothiazide (BENICAR HCT) 40-25 MG tablet Take 1 tablet by mouth daily. 01/26/18  Yes [provider]  traMADol (ULTRAM) 50 MG tablet Take 50-100 mg by mouth every 4 (four) hours as needed for moderate pain. Takes with Warfarin 04/03/18  Yes [provider]  warfarin (COUMADIN) 2 MG tablet Take 2 mg by mouth daily.   03/02/18  Yes [provider]      VITAL SIGNS:  Blood pressure 120/75, pulse 93, temperature 98.6 F (37 C), temperature source Axillary, resp. rate 17, height 5\' 2"  (1.575 m), weight 82.6 kg, SpO2 99 %.  PHYSICAL EXAMINATION:  Physical Exam  GENERAL:  83 y.o.-year-old Caucasian female patient lying in the bed in mild respiratory distress with conversational dyspnea EYES: Pupils equal, round, reactive to light and accommodation. No scleral icterus. Extraocular muscles intact.  HEENT: Head atraumatic, normocephalic. Oropharynx and nasopharynx clear.  NECK:  Supple, no jugular venous distention. No thyroid enlargement, no tenderness.  LUNGS: DIMINISHED BIBASILAR BREATH SOUNDS WITH BIBASAL RALES AND HARSH VESICULAR BREATHING WITH DIMINISHED EXPIRATORY AIRFLOW WITH OCCASIONAL WHEEZE.  CARDIOVASCULAR: Regular rate and rhythm, S1, S2 normal. No murmurs, rubs, or gallops.  ABDOMEN: Soft, nondistended, nontender. Bowel sounds present. No organomegaly or mass.  EXTREMITIES: Trace bilateral lower extremity pitting edema, with no cyanosis, or clubbing.  NEUROLOGIC: Cranial nerves II through XII are intact. Muscle strength 5/5 in all extremities. Sensation intact. Gait not checked.  PSYCHIATRIC: The patient is alert and oriented x 3.  Normal affect and good eye contact. SKIN: No obvious rash, lesion, or ulcer.   LABORATORY PANEL:   CBC Recent Labs  Lab 03/22/19 0326  WBC 14.3*  HGB 11.9*  HCT 39.0  PLT 257   ------------------------------------------------------------------------------------------------------------------  Chemistries  Recent Labs  Lab 03/22/19 0326  NA 138  K 4.4  CL 103  CO2 24  GLUCOSE 177*  BUN 33*  CREATININE 1.72*  CALCIUM 9.4   ------------------------------------------------------------------------------------------------------------------  Cardiac Enzymes No results for input(s): TROPONINI in the last 168 hours.  ------------------------------------------------------------------------------------------------------------------  RADIOLOGY:  Dg Chest Port 1 View  Result Date: 03/22/2019 CLINICAL DATA:  Chest pain EXAM: PORTABLE CHEST 1 VIEW COMPARISON:  06/29/2018 FINDINGS: The lungs are well inflated. There is mild cardiomegaly. There is central pulmonary congestion with mild interstitial opacity. No focal consolidation. There is no pleural effusion or pneumothorax. IMPRESSION: Mild cardiomegaly and central pulmonary congestion without overt pulmonary edema. Electronically Signed   By: Ulyses Jarred M.D.   On: 03/22/2019 03:34      IMPRESSION AND PLAN:  1.  COPD acute exacerbation like secondary to acute bronchitis.  The patient will be admitted to the medical monitored bed.  We will continue nebulized DuoNebs 4 times daily and every 4 hours as needed as well as IV Solu-Medrol.  Will place on antibiotic therapy with IV Rocephin.  Sputum and blood cultures will be obtained.  We will hold off Advair discus.  2.  Acute on chronic diastolic CHF.  Patient will be diuresed with IV Lasix.  He had 2 sets of high-sensitivity troponin I that were 21 then 24.  Most recent echo showed an EF of 55 to 60% with moderate aortic stenosis mild mitral regurgitation and moderate left atrial dilatation as well as mild right atrial dilatation in October 2019..  Will repeat another 2D echo while she is here.  She will be diuresed with IV Lasix.  3.  Atrial fibrillation with rapid response.  This could be related to respiratory distress and dyspnea.  We will continue amiodarone and Coumadin and follow INR.  Will utilize IV Cardizem as needed.  Toprol-XL will be resumed.  4.  Hypertension.  We will continue Toprol-XL.  5.  DVT prophylaxis.  Continue Coumadin.   All the records are reviewed and case discussed with ED provider. The plan of care was discussed in details with the patient (and family). I answered all questions. The  patient agreed to proceed with the above mentioned plan. Further management will depend upon hospital course.   CODE STATUS: Full code  TOTAL TIME TAKING CARE OF THIS PATIENT: 50 minutes.    Christel Mormon M.D on 03/22/2019 at [place] AM  Pager - [phone]  After 6pm go to [email or web address] - Proofreader  [facility]  Office  [phone]  CC: Primary care physician; [place] Pink, MD   Note: This dictation was prepared with Dragon dictation along with smaller phrase technology. Any transcriptional errors that result from this process are unintentional.

## 2019-03-22 NOTE — ED Notes (Signed)
Pt placed back on [place] @ 2L

## 2019-03-22 NOTE — ED Notes (Signed)
Pt does not take tramadol with tylenol prn but schedules daily at 10AM

## 2019-03-22 NOTE — Progress Notes (Signed)
Initial Nutrition Assessment  DOCUMENTATION CODES:   Not applicable  INTERVENTION:   Ensure Max protein supplement daily, each supplement provides 150kcal and 30g of protein.  2 gram sodium diet   NUTRITION DIAGNOSIS:   Increased nutrient needs related to catabolic illness as evidenced by increased estimated needs  GOAL:   Patient will meet greater than or equal to 90% of their needs  MONITOR:   PO intake, Supplement acceptance, Labs, Weight trends, I & O's, Skin  REASON FOR ASSESSMENT:   Consult Assessment of nutrition requirement/status  ASSESSMENT:   83 y.o. Caucasian female with a known history of multiple medical problems that will be mentioned below including COPD, CAD, CHF and type diabetes mellitus, presented to the emergency room with concern of worsening dyspnea over the last couple of days with associated wheezing without significant cough.   Unable to see patient as pt still in ED at time of RD visit. Per chart review, pt with 8lb(4%) over the past 8 months; this is not significant. RD will add supplements to help pt meet her estimated needs. RD will obtain nutrition related history at follow-up once patient admitted to hospital floor.   Medications reviewed and include: oscal w/ D, D3, solu-medrol, MVI, wafarin, ceftriaxone   Labs reviewed: BUN 33(H), creat 1.72(H) Wbc- 14.3(H)  Unable to complete Nutrition-Focused physical exam at this time.   Diet Order:   Diet Order            Diet 2 gram sodium Room service appropriate? Yes; Fluid consistency: Thin  Diet effective now             EDUCATION NEEDS:   Not appropriate for education at this time  Skin:  Skin Assessment: Reviewed RN Assessment  Last BM:  pta  Height:   Ht Readings from Last 1 Encounters:  03/22/19 5\' 2"  (1.575 m)    Weight:   Wt Readings from Last 1 Encounters:  03/22/19 82.6 kg    Ideal Body Weight:  50 kg  BMI:  Body mass index is 33.29 kg/m.  Estimated  Nutritional Needs:   Kcal:  1600-1800kcal/day  Protein:  80-90g/day  Fluid:  >1.3L/day  Koleen Distance MS, RD, LDN Pager #- [phone] Office#- [phone] After Hours Pager: [phone]

## 2019-03-22 NOTE — Progress Notes (Signed)
PT Cancellation Note  Patient Details Name: Ashley Bird MRN: [id number] DOB: 06-02-1936   Cancelled Treatment:    Reason Eval/Treat Not Completed: (Consult received and chart reviewed.  Patient remains in ER, awaiting transfer to floor.  Will initiate evaluation once patient arrived to floor and appropriate for exertional activity.)   Verba Ainley H. Owens Shark, PT, DPT, NCS 03/22/19, 4:22 PM [phone]

## 2019-03-22 NOTE — ED Provider Notes (Signed)
[facility] Emergency Department Provider Note __________   First MD Initiated Contact with Patient 03/22/19 [phone]     (approximate)  I have reviewed the triage vital signs and the nursing notes.  History obtained from the patient and EMS  HISTORY  Chief Complaint Chest Pain and Shortness of Breath   HPI Ashley Bird is a 83 y.o. female with below list of previous medical conditions including COPD and CHF presents to the emergency department via EMS with progressive dyspnea x2 days with acute worsening over the last 24 hours.  Patient also admits to nonradiating chest pain.  Patient initial oxygen saturation 90% on room air on arrival to the emergency department with apparent respiratory difficulty.  Patient states that current pain score is 3 out of 10.  Patient denies any fever.  Patient does admit to cough and orthopnea        Past Medical History:  Diagnosis Date  . CAD (coronary artery disease)   . CHF (congestive heart failure) ([place])   . COPD (chronic obstructive pulmonary disease) ([place])   . Diabetes ([place])   . HLD (hyperlipidemia)   . HTN (hypertension)   . PAF (paroxysmal atrial fibrillation) [facility])     Patient Active Problem List   Diagnosis Date Noted  . COPD exacerbation ([place]) 03/22/2019  . Palliative care encounter 11/28/2018  . Shortness of breath 11/28/2018  . Chronic diastolic heart failure ([place]) 05/22/2018  . Acute systolic CHF (congestive heart failure) ([place]) 05/01/2018  . CAP (community acquired pneumonia) 05/01/2018  . COPD with acute exacerbation ([place]) 05/01/2018  . Diabetes ([place]) 05/01/2018  . HTN (hypertension) 05/01/2018  . HLD (hyperlipidemia) 05/01/2018  . PAF (paroxysmal atrial fibrillation) ([place]) 05/01/2018    Past Surgical History:  Procedure Laterality Date  . ABDOMINAL HYSTERECTOMY    . BREAST EXCISIONAL BIOPSY Left 40 yrs ago   neg  . BREAST EXCISIONAL BIOPSY Left 40 yrs ago   neg    Prior to Admission  medications   Medication Sig Start Date End Date Taking? Authorizing Provider  acetaminophen (TYLENOL) 325 MG tablet Take 325 mg by mouth every 6 (six) hours as needed. Takes with Tramadol   Yes [provider]  ADVAIR DISKUS 100-50 MCG/DOSE AEPB Inhale 1 puff into the lungs every 12 (twelve) hours. 02/23/18  Yes [provider]  albuterol (ACCUNEB) 1.25 MG/3ML nebulizer solution Inhale 1 ampule into the lungs every 4 (four) hours as needed for wheezing or shortness of breath.  05/11/18 05/11/19 Yes [provider]  albuterol (PROVENTIL HFA;VENTOLIN HFA) 108 (90 Base) MCG/ACT inhaler Inhale 2 puffs into the lungs every 4 (four) hours as needed for wheezing or shortness of breath.  05/11/18 05/11/19 Yes [provider]  amiodarone (PACERONE) 200 MG tablet Take 200 mg by mouth daily. 03/03/18  Yes [provider]  b complex vitamins tablet Take 1 tablet by mouth daily.   Yes [provider]  calcium carbonate (OSCAL) 1500 (600 Ca) MG TABS tablet Take 600 mg of elemental calcium by mouth daily with breakfast.   Yes [provider]  cetirizine (ZYRTEC) 10 MG tablet Take 10 mg by mouth every other day.    Yes [provider]  Cholecalciferol (VITAMIN D3) 5000 units TABS Take 1 tablet by mouth daily.   Yes [provider]  furosemide (LASIX) 20 MG tablet Take 20 mg by mouth as needed for fluid.  05/12/18 05/12/19 Yes [provider]  Melatonin 1 MG TABS Take  1 mg by mouth at bedtime as needed (sleep).    Yes [provider]  metoprolol succinate (TOPROL-XL) 50 MG 24 hr tablet Take 50 mg by mouth daily. 05/01/18  Yes [provider]  Multiple Vitamins-Minerals (MULTIVITAMIN GUMMIES WOMENS) CHEW Chew 1 tablet by mouth daily.   Yes [provider]  olmesartan-hydrochlorothiazide (BENICAR HCT) 40-25 MG tablet Take 1 tablet by mouth daily. 01/26/18  Yes [provider]  traMADol (ULTRAM) 50  MG tablet Take 50-100 mg by mouth every 4 (four) hours as needed for moderate pain. Takes with Warfarin 04/03/18  Yes [provider]  warfarin (COUMADIN) 2 MG tablet Take 2 mg by mouth daily.  03/02/18  Yes [provider]    Allergies Alendronate, Doxycycline, Effexor [venlafaxine], Ferralet [iron-folic [id number], Ferrous sulfate, Penicillins, Prozac [fluoxetine], Simvastatin, Sulfa antibiotics, and Zoloft [sertraline hcl]  Family History  Problem Relation Age of Onset  . Colon cancer Mother   . Diabetes Mother   . Asthma Father     Social History Social History   Tobacco Use  . Smoking status: Former Smoker    Packs/day: 2.00    Years: 30.00    Pack years: 60.00    Types: Cigarettes    Quit date: 05/1979    Years since quitting: 39.8  . Smokeless tobacco: Never Used  Substance Use Topics  . Alcohol use: Not Currently  . Drug use: Never    Review of Systems Constitutional: No fever/chills Eyes: No visual changes. ENT: No sore throat. Cardiovascular: Positive for chest pain. Respiratory: Positive for shortness of breath and cough Gastrointestinal: No abdominal pain.  No nausea, no vomiting.  No diarrhea.  No constipation. Genitourinary: Negative for dysuria. Musculoskeletal: Negative for neck pain.  Negative for back pain. Integumentary: Negative for rash. Neurological: Negative for headaches, focal weakness or numbness.   ____________________________________________   PHYSICAL EXAM:  VITAL SIGNS: ED Triage Vitals  Enc Vitals Group     BP 03/22/19 0319 118/88     Pulse Rate 03/22/19 0319 (!) 108     Resp 03/22/19 0319 20     Temp 03/22/19 0319 98.6 F (37 C)     Temp Source 03/22/19 0319 Axillary     SpO2 03/22/19 0308 98 %     Weight 03/22/19 0320 82.6 kg (182 lb)     Height 03/22/19 0320 1.575 m (5\' 2" )     Head Circumference --      Peak Flow --      Pain Score 03/22/19 0319 3     Pain Loc --      Pain Edu? --      Excl.  in [place]? --     Constitutional: Alert and oriented.  Apparent respiratory difficulty Eyes: Conjunctivae are normal.  Mouth/Throat: Mucous membranes are moist. Neck: No stridor.  No meningeal signs.   Cardiovascular: Tachycardia, regular rhythm. Good peripheral circulation. Grossly normal heart sounds. Respiratory: Tachypnea, positive accessory respiratory muscle use, diffuse rhonchi Gastrointestinal: Soft and nontender. No distention.  Musculoskeletal: No lower extremity tenderness nor edema. No gross deformities of extremities. Neurologic:  Normal speech and language. No gross focal neurologic deficits are appreciated.  Skin:  Skin is warm, dry and intact. Psychiatric: Mood and affect are normal. Speech and behavior are normal.  ____________________________________________   LABS (all labs ordered are listed, but only abnormal results are displayed)  Labs Reviewed  BASIC METABOLIC PANEL - Abnormal; Notable for the following components:      Result Value  Glucose, Bld 177 (*)    BUN 33 (*)    Creatinine, Ser 1.72 (*)    GFR calc non Af Amer 27 (*)    GFR calc Af Amer 32 (*)    All other components within normal limits  CBC - Abnormal; Notable for the following components:   WBC 14.3 (*)    Hemoglobin 11.9 (*)    All other components within normal limits  TROPONIN I (HIGH SENSITIVITY) - Abnormal; Notable for the following components:   Troponin I (High Sensitivity) 21 (*)    All other components within normal limits  TROPONIN I (HIGH SENSITIVITY) - Abnormal; Notable for the following components:   Troponin I (High Sensitivity) 24 (*)    All other components within normal limits  SARS CORONAVIRUS 2 (HOSPITAL ORDER, Page LAB)  CULTURE, BLOOD (ROUTINE X 2)  CULTURE, BLOOD (ROUTINE X 2)  EXPECTORATED SPUTUM ASSESSMENT W REFEX TO RESP CULTURE  HIV ANTIBODY (ROUTINE TESTING W REFLEX)  PROTIME-INR   ____________________________________________  EKG   ED ECG REPORT I, [place] N Masayo Fera, the attending physician, personally viewed and interpreted this ECG.   Date: 03/22/2019  EKG Time: 3:13 AM  Rate: 11  Rhythm: Atrial fibrillation with rapid ventricular response.  Left bundle branch block  Axis: Leftward axis  Intervals: Irregular RR interval  ST&T Change: None  ____________________________________________  RADIOLOGY I, [place] N Torben Soloway, personally viewed and evaluated these images (plain radiographs) as part of my medical decision making, as well as reviewing the written report by the radiologist.  ED MD interpretation: Mild cardiomegaly with central pulmonary congestion without overt pulmonary edema  Official radiology report(s): Dg Chest Port 1 View  Result Date: 03/22/2019 CLINICAL DATA:  Chest pain EXAM: PORTABLE CHEST 1 VIEW COMPARISON:  06/29/2018 FINDINGS: The lungs are well inflated. There is mild cardiomegaly. There is central pulmonary congestion with mild interstitial opacity. No focal consolidation. There is no pleural effusion or pneumothorax. IMPRESSION: Mild cardiomegaly and central pulmonary congestion without overt pulmonary edema. Electronically Signed   By: Ulyses Jarred M.D.   On: 03/22/2019 03:34    ____________________________________________   PROCEDURES    .Critical Care Performed by: Gregor Hams, MD Authorized by: Gregor Hams, MD   Critical care provider statement:    Critical care time (minutes):  30   Critical care time was exclusive of:  Separately billable procedures and treating other patients   Critical care was necessary to treat or prevent imminent or life-threatening deterioration of the following conditions:  Respiratory failure   Critical care was time spent personally by me on the following activities:  Development of treatment plan with patient or surrogate, discussions with consultants, evaluation of patient's response to treatment, examination of patient, obtaining  history from patient or surrogate, ordering and performing treatments and interventions, ordering and review of laboratory studies, ordering and review of radiographic studies, pulse oximetry, re-evaluation of patient's condition and review of old charts     ____________________________________________   INITIAL IMPRESSION / MDM / [place] / ED COURSE  As part of my medical decision making, I reviewed the following data within the electronic MEDICAL RECORD NUMBER   83 year old female presented with above-stated history and physical exam secondary to respiratory stress concern for possible COPD exacerbation versus CHF exacerbation.  Patient was given 2 duo nebs and IV Solu-Medrol immediately upon evaluation with improvement in work of breathing.  EKG revealed no evidence of ischemia or infarction however patient's troponin  elevated at 21 with repeat being 24.  Clinical findings most consistent with COPD exacerbation.  Patient responding well thus far to intervention.  Patient discussed with Dr.Mansy for hospital admission for further evaluation and management.  Of note patient COVID-19 negative ____________________________________________  FINAL CLINICAL IMPRESSION(S) / ED DIAGNOSES  Final diagnoses:  COPD exacerbation ([place])     MEDICATIONS GIVEN DURING THIS VISIT:  Medications  traMADol (ULTRAM) tablet 50 mg (has no administration in time range)  amiodarone (PACERONE) tablet 200 mg (has no administration in time range)  metoprolol succinate (TOPROL-XL) 24 hr tablet 50 mg (has no administration in time range)  olmesartan-hydrochlorothiazide (BENICAR HCT) 40-25 MG per tablet 1 tablet (has no administration in time range)  warfarin (COUMADIN) tablet 2 mg (has no administration in time range)  Melatonin TABS 1 mg (has no administration in time range)  multivitamin with minerals tablet 1 tablet (has no administration in time range)  calcium carbonate (OSCAL) tablet 1,500 mg (has no  administration in time range)  Vitamin D3 TABS 5,000 Units (has no administration in time range)  Multivitamin Gummies Womens CHEW 1 tablet (has no administration in time range)  loratadine (CLARITIN) tablet 10 mg (has no administration in time range)  acetaminophen (TYLENOL) tablet 650 mg (has no administration in time range)    Or  acetaminophen (TYLENOL) suppository 650 mg (has no administration in time range)  magnesium hydroxide (MILK OF MAGNESIA) suspension 30 mL (has no administration in time range)  ondansetron (ZOFRAN) tablet 4 mg (has no administration in time range)    Or  ondansetron (ZOFRAN) injection 4 mg (has no administration in time range)  cefTRIAXone (ROCEPHIN) 1 g in sodium chloride 0.9 % 100 mL IVPB (has no administration in time range)  methylPREDNISolone sodium succinate (SOLU-MEDROL) 125 mg/2 mL injection 60 mg (has no administration in time range)    Followed by  predniSONE (DELTASONE) tablet 40 mg (has no administration in time range)  ipratropium-albuterol (DUONEB) 0.5-2.5 (3) MG/3ML nebulizer solution 3 mL (has no administration in time range)  azithromycin (ZITHROMAX) 500 mg in sodium chloride 0.9 % 250 mL IVPB (has no administration in time range)  methylPREDNISolone sodium succinate (SOLU-MEDROL) 125 mg/2 mL injection (has no administration in time range)  ipratropium-albuterol (DUONEB) 0.5-2.5 (3) MG/3ML nebulizer solution (3 mLs  Given 03/22/19 0328)  methylPREDNISolone sodium succinate (SOLU-MEDROL) 125 mg/2 mL injection 125 mg (125 mg Intravenous Given 03/22/19 0335)  ipratropium-albuterol (DUONEB) 0.5-2.5 (3) MG/3ML nebulizer solution 3 mL (3 mLs Nebulization Given 03/22/19 0328)     ED Discharge Orders    None      *Please note:  ANGELYNN HARROP was evaluated in Emergency Department on 03/22/2019 for the symptoms described in the history of present illness. She was evaluated in the context of the global COVID-19 pandemic, which necessitated consideration  that the patient might be at risk for infection with the SARS-CoV-2 virus that causes COVID-19. Institutional protocols and algorithms that pertain to the evaluation of patients at risk for COVID-19 are in a state of rapid change based on information released by regulatory bodies including the CDC and federal and state organizations. These policies and algorithms were followed during the patient's care in the ED.  Some ED evaluations and interventions may be delayed as a result of limited staffing during the pandemic.*  Note:  This document was prepared using Dragon voice recognition software and may include unintentional dictation errors.   Gregor Hams, MD 03/22/19 [phone]

## 2019-03-22 NOTE — Consult Note (Addendum)
ANTICOAGULATION CONSULT NOTE - Initial Consult  Pharmacy Consult for Warfarin Dosing and Monitoring  Indication: atrial fibrillation  Allergies  Allergen Reactions  . Alendronate Other (See Comments)    Reaction: unknown  . Doxycycline Swelling    Swelling and hemmorage     . Effexor [Venlafaxine] Other (See Comments)    Body ached   . Ferralet [Iron-Folic 123456 Nausea And Vomiting    Vomiting   . Ferrous Sulfate Nausea And Vomiting  . Penicillins Swelling    Per pt: swelling and hemmorage     . Prozac [Fluoxetine] Other (See Comments)    Reaction: unknown  . Simvastatin Other (See Comments)    Muscle pain   . Sulfa Antibiotics Other (See Comments)    Reaction: unknown  . Zoloft [Sertraline Hcl] Other (See Comments)    Reaction: unknown    Patient Measurements: Height: 5\' 2"  (157.5 cm) Weight: 182 lb (82.6 kg) IBW/kg (Calculated) : 50.1  Vital Signs: Temp: 98.6 F (37 C) (09/10 0319) Temp Source: Axillary (09/10 0319) BP: 124/79 (09/10 0730) Pulse Rate: 95 (09/10 0730)  Labs: Recent Labs    03/22/19 0326 03/22/19 0507 03/22/19 0656  HGB 11.9*  --   --   HCT 39.0  --   --   PLT 257  --   --   LABPROT  --   --  29.7*  INR  --   --  2.9*  CREATININE 1.72*  --   --   TROPONINIHS 21* 24*  --     Estimated Creatinine Clearance: 25.6 mL/min (A) (by C-G formula based on SCr of 1.72 mg/dL (H)).   Medical History: Past Medical History:  Diagnosis Date  . CAD (coronary artery disease)   . CHF (congestive heart failure) ([place])   . COPD (chronic obstructive pulmonary disease) ([place])   . Diabetes ([place])   . HLD (hyperlipidemia)   . HTN (hypertension)   . PAF (paroxysmal atrial fibrillation) ([place])     Assessment: Pharmacy consulted for warfarin dosing and monitoring in 83 yo female with PMH of A. Fib. Patient was taking warfarin PTA. INR therapeutic on admission @ 2.9. Last reported dose 9/9 @ 2030. Patient is receiving antibiotics for  COPD  acute exacerbation.   Home Regimen: Warfarin 2mg  daily  DATE INR DOSE 9/10 2.9  Goal of Therapy:  INR 2-3 Monitor platelets by anticoagulation protocol: Yes   Plan:  9/10 INR at high end of normal. Could see a increase in INR while on antibiotics. Concurrent use of amiodarone and warfarin can also results in increased INR, but patient was taking this PTA.   Will order Warfarin 1mg  PO x 1 dose this evening.   INR with AM labs daily per protocol while on antibiotics. CBC at least every 3 days.   Pharmacy will continue to follow and order warfarin dose based on INR levels.    Pernell Dupre, PharmD, BCPS Clinical Pharmacist 03/22/2019 8:31 AM

## 2019-03-22 NOTE — Progress Notes (Signed)
Advanced Care Plan.  Purpose of Encounter: CODE STATUS Parties in Attendance: The patient, her daughter and me. Patient's Decisional Capacity: Yes. Medical Story:  Ashley Bird  is a 83 y.o. Caucasian female with a known history of multiple medical problems that will be mentioned below including COPD, CAD, CHF and type diabetes mellitus.  The patient is admitted for acute respiratory failure with hypoxia due to COPD exacerbation and CHF.  I discussed with patient about her current condition, prognosis and CODE STATUS.  The patient does not want to be resuscitated or intubated if she has cardiopulmonary arrest. Plan:  Code Status: DNR. Time spent discussing advance care planning: 18 minutes.

## 2019-03-22 NOTE — ED Notes (Signed)
Pt's daughter at bedside at this time.

## 2019-03-22 NOTE — ED Notes (Addendum)
Pt assisted to restroom.  

## 2019-03-23 LAB — CBC
HCT: 34 % — ABNORMAL LOW (ref 36.0–46.0)
Hemoglobin: 10.6 g/dL — ABNORMAL LOW (ref 12.0–15.0)
MCH: 27.2 pg (ref 26.0–34.0)
MCHC: 31.2 g/dL (ref 30.0–36.0)
MCV: 87.2 fL (ref 80.0–100.0)
Platelets: 198 10*3/uL (ref 150–400)
RBC: 3.9 MIL/uL (ref 3.87–5.11)
RDW: 15 % (ref 11.5–15.5)
WBC: 11.5 10*3/uL — ABNORMAL HIGH (ref 4.0–10.5)
nRBC: 0 % (ref 0.0–0.2)

## 2019-03-23 LAB — BASIC METABOLIC PANEL
Anion gap: 9 (ref 5–15)
BUN: 35 mg/dL — ABNORMAL HIGH (ref 8–23)
CO2: 28 mmol/L (ref 22–32)
Calcium: 9.4 mg/dL (ref 8.9–10.3)
Chloride: 103 mmol/L (ref 98–111)
Creatinine, Ser: 1.57 mg/dL — ABNORMAL HIGH (ref 0.44–1.00)
GFR calc Af Amer: 35 mL/min — ABNORMAL LOW (ref >60)
GFR calc non Af Amer: 31 mL/min — ABNORMAL LOW (ref >60)
Glucose, Bld: 207 mg/dL — ABNORMAL HIGH (ref 70–99)
Potassium: 3.9 mmol/L (ref 3.5–5.1)
Sodium: 140 mmol/L (ref 135–145)

## 2019-03-23 LAB — PROTIME-INR
INR: 3.1 — ABNORMAL HIGH (ref 0.8–1.2)
Prothrombin Time: 31.6 seconds — ABNORMAL HIGH (ref 11.4–15.2)

## 2019-03-23 LAB — HIV ANTIBODY (ROUTINE TESTING W REFLEX): HIV Screen 4th Generation wRfx: NONREACTIVE

## 2019-03-23 MED ORDER — SENNA 8.6 MG PO TABS
1.0000 | ORAL_TABLET | Freq: Every day | ORAL | Status: DC | PRN
  Administered 2019-03-23: 8.6 mg via ORAL
  Filled 2019-03-23 (×2): qty 1

## 2019-03-23 MED ORDER — WARFARIN SODIUM 1 MG PO TABS
1.0000 mg | ORAL_TABLET | Freq: Once | ORAL | Status: AC
  Administered 2019-03-23: 1 mg via ORAL
  Filled 2019-03-23: qty 1

## 2019-03-23 MED ORDER — BISACODYL 5 MG PO TBEC
5.0000 mg | DELAYED_RELEASE_TABLET | Freq: Every day | ORAL | Status: DC | PRN
  Administered 2019-03-23: 5 mg via ORAL
  Filled 2019-03-23: qty 1

## 2019-03-23 MED ORDER — IPRATROPIUM-ALBUTEROL 0.5-2.5 (3) MG/3ML IN SOLN
3.0000 mL | Freq: Three times a day (TID) | RESPIRATORY_TRACT | Status: DC
  Administered 2019-03-23 (×2): 3 mL via RESPIRATORY_TRACT
  Filled 2019-03-23 (×2): qty 3

## 2019-03-23 NOTE — Consult Note (Signed)
ANTICOAGULATION CONSULT NOTE - Initial Consult  Pharmacy Consult for Warfarin Dosing and Monitoring  Indication: atrial fibrillation  Allergies  Allergen Reactions  . Alendronate Other (See Comments)    Reaction: unknown  . Doxycycline Swelling    Swelling and hemmorage     . Effexor [Venlafaxine] Other (See Comments)    Body ached   . Ferralet [Iron-Folic 123456 Nausea And Vomiting    Vomiting   . Ferrous Sulfate Nausea And Vomiting  . Penicillins Swelling    Per pt: swelling and hemmorage     . Prozac [Fluoxetine] Other (See Comments)    Reaction: unknown  . Simvastatin Other (See Comments)    Muscle pain   . Sulfa Antibiotics Other (See Comments)    Reaction: unknown  . Zoloft [Sertraline Hcl] Other (See Comments)    Reaction: unknown    Patient Measurements: Height: 5\' 2"  (157.5 cm) Weight: 182 lb 15.7 oz (83 kg) IBW/kg (Calculated) : 50.1  Vital Signs: Temp: 98.2 F (36.8 C) (09/11 0739) Temp Source: Oral (09/11 0739) BP: 134/73 (09/11 0739) Pulse Rate: 81 (09/11 0739)  Labs: Recent Labs    03/22/19 0326 03/22/19 0507 03/22/19 0656 03/23/19 0524  HGB 11.9*  --   --  10.6*  HCT 39.0  --   --  34.0*  PLT 257  --   --  198  LABPROT  --   --  29.7* 31.6*  INR  --   --  2.9* 3.1*  CREATININE 1.72*  --   --  1.57*  TROPONINIHS 21* 24*  --   --     Estimated Creatinine Clearance: 28.1 mL/min (A) (by C-G formula based on SCr of 1.57 mg/dL (H)).   Medical History: Past Medical History:  Diagnosis Date  . CAD (coronary artery disease)   . CHF (congestive heart failure) ([place])   . COPD (chronic obstructive pulmonary disease) ([place])   . Diabetes ([place])   . HLD (hyperlipidemia)   . HTN (hypertension)   . PAF (paroxysmal atrial fibrillation) ([place])     Assessment: Pharmacy consulted for warfarin dosing and monitoring in 83 yo female with PMH of A. Fib. Patient was taking warfarin PTA. INR therapeutic on admission @ 2.9. Last reported dose  9/9 @ 2030. Patient is receiving antibiotics for  COPD acute exacerbation.   Home Regimen: Warfarin 2mg  daily  DATE INR DOSE 9/10 2.9       1mg  9/11     3.1   Goal of Therapy:  INR 2-3 Monitor platelets by anticoagulation protocol: Yes   Plan:  9/11 INR slightly supratherapeutic.   Could see a increase in INR while on antibiotics. Concurrent use of amiodarone and warfarin can also results in increased INR, but patient was taking this PTA.   Will order Warfarin 1mg  PO x 1 dose again this evening.   INR with AM labs daily per protocol while on antibiotics. CBC at least every 3 days.   Pharmacy will continue to follow and order warfarin dose based on INR levels.    Lu Duffel, PharmD, BCPS Clinical Pharmacist 03/23/2019 9:21 AM

## 2019-03-23 NOTE — Plan of Care (Signed)
Educated pt on low sodium diet, per pt she follows a strict diet at home and weighs herself every morning.   Problem: Education: Goal: Knowledge of General Education information will improve Description: Including pain rating scale, medication(s)/side effects and non-pharmacologic comfort measures Outcome: Progressing   Problem: Clinical Measurements: Goal: Ability to maintain clinical measurements within normal limits will improve Outcome: Progressing Goal: Respiratory complications will improve Outcome: Progressing

## 2019-03-23 NOTE — Progress Notes (Signed)
[facility] [place] NAME: Ashley Bird    MR#:  [id number]  DATE OF BIRTH:  08-06-1935  SUBJECTIVE:  CHIEF COMPLAINT:   Chief Complaint  Patient presents with  . Chest Pain  . Shortness of Breath   The patient feels better, no cough or wheezing, better shortness of breath.  On oxygen 2 L this morning. REVIEW OF SYSTEMS:  Review of Systems  Constitutional: Negative for chills, fever and malaise/fatigue.  HENT: Negative for sore throat.   Eyes: Negative for blurred vision and double vision.  Respiratory: Positive for shortness of breath. Negative for cough, hemoptysis, wheezing and stridor.   Cardiovascular: Negative for chest pain, palpitations, orthopnea and leg swelling.  Gastrointestinal: Negative for abdominal pain, blood in stool, diarrhea, melena, nausea and vomiting.  Genitourinary: Negative for dysuria, flank pain and hematuria.  Musculoskeletal: Negative for back pain and joint pain.  Skin: Negative for rash.  Neurological: Negative for dizziness, sensory change, focal weakness, seizures, loss of consciousness, weakness and headaches.  Endo/Heme/Allergies: Negative for polydipsia.  Psychiatric/Behavioral: Negative for depression. The patient is not nervous/anxious.     DRUG ALLERGIES:   Allergies  Allergen Reactions  . Alendronate Other (See Comments)    Reaction: unknown  . Doxycycline Swelling    Swelling and hemmorage     . Effexor [Venlafaxine] Other (See Comments)    Body ached   . Ferralet [Iron-Folic 123456 Nausea And Vomiting    Vomiting   . Ferrous Sulfate Nausea And Vomiting  . Penicillins Swelling    Per pt: swelling and hemmorage     . Prozac [Fluoxetine] Other (See Comments)    Reaction: unknown  . Simvastatin Other (See Comments)    Muscle pain   . Sulfa Antibiotics Other (See Comments)    Reaction: unknown  . Zoloft [Sertraline Hcl] Other (See Comments)    Reaction: unknown   VITALS:  Blood pressure 120/72, pulse 77, temperature 98.2 F (36.8 C), temperature source Oral, resp. rate 18, height 5\' 2"  (1.575 m), weight 83 kg, SpO2 95 %. PHYSICAL EXAMINATION:  Physical Exam Constitutional:      General: She is not in acute distress.    Appearance: She is obese.  HENT:     Head: Normocephalic.     Mouth/Throat:     Mouth: Mucous membranes are moist.  Eyes:     General: No scleral icterus.    Conjunctiva/sclera: Conjunctivae normal.     Pupils: Pupils are equal, round, and reactive to light.  Neck:     Musculoskeletal: Normal range of motion and neck supple.     Vascular: No JVD.     Trachea: No tracheal deviation.  Cardiovascular:     Rate and Rhythm: Normal rate and regular rhythm.     Heart sounds: Normal heart sounds. No murmur. No gallop.   Pulmonary:     Effort: Pulmonary effort is normal. No respiratory distress.     Breath sounds: Rales present. No wheezing.  Abdominal:     General: Bowel sounds are normal. There is no distension.     Palpations: Abdomen is soft.     Tenderness: There is no abdominal tenderness. There is no rebound.  Musculoskeletal: Normal range of motion.        General: No tenderness.     Right lower leg: No edema.     Left lower leg: No edema.  Skin:    Findings: No erythema or rash.  Neurological:  General: No focal deficit present.     Mental Status: She is alert and oriented to person, place, and time.     Cranial Nerves: No cranial nerve deficit.  Psychiatric:        Mood and Affect: Mood normal.    LABORATORY PANEL:  Female CBC Recent Labs  Lab 03/23/19 0524  WBC 11.5*  HGB 10.6*  HCT 34.0*  PLT 198   ------------------------------------------------------------------------------------------------------------------ Chemistries  Recent Labs  Lab 03/23/19 0524  NA 140  K 3.9  CL 103  CO2 28  GLUCOSE 207*  BUN 35*  CREATININE 1.57*  CALCIUM 9.4   RADIOLOGY:  No results found. ASSESSMENT AND  PLAN:  Acute respiratory failure with hypoxia due to COPD exacerbation and CHF. Try to wean off oxygen.  NEB PRN.  1.  COPD acute exacerbation like secondary to acute bronchitis.  Continue DuoNebs and taper steroids.  2.  Acute on chronic diastolic CHF.  Continue IV Lasix twice daily. Recent echo showed an EF of 55 to 60% with moderate aortic stenosis mild mitral regurgitation and moderate left atrial dilatation as well as mild right atrial dilatation in October 2019.  3.  Atrial fibrillation with rapid response.   Continue amiodarone and Coumadin pharmacy to dose.  IV Cardizem as needed.  Toprol-XL is resumed.  4.  Hypertension.  Continue Toprol-XL, irbesartan -HCTZ..  CKD stage III.  Stable.  Follow-up BMP while on Lasix.   PT evaluation suggest home health and PT. All the records are reviewed and case discussed with Care Management/Social Worker. Management plans discussed with the patient, her daughter and they are in agreement.  CODE STATUS: DNR  TOTAL TIME TAKING CARE OF THIS PATIENT: 26 minutes.   More than 50% of the time was spent in counseling/coordination of care: YES  POSSIBLE D/C IN 2 DAYS, DEPENDING ON CLINICAL CONDITION.   Demetrios Loll M.D on 03/23/2019 at 2:26 PM  Between 7am to 6pm - Pager - [phone]  After 6pm go to [email or web address] - [facility]

## 2019-03-23 NOTE — Evaluation (Signed)
Physical Therapy Evaluation Patient Details Name: Ashley Bird MRN: [id number] DOB: 07/24/1935 Today's Date: 03/23/2019   History of Present Illness  Ashley Bird  is a 83 y.o. Caucasian female with a known history of multiple medical problems including COPD, CAD, CHF and type diabetes mellitus, presented to the emergency room with concern of worsening dyspnea over the last couple of days with associated wheezing without significant cough.  She has been having orthopnea over the last couple of days with dyspnea on exertion and paroxysmal nocturnal dyspnea.    Clinical Impression  Pt up in bed, A&Ox4, some moderate shakiness noted, HR and spO2 WFLs on 1.5L. Pt stated she lives with her daughter, though has her own "apartment", previously independent (daughter does the cooking), utilizes rollator and quad cane as needed though rarely. Denies any falls in the last 6 months but does report a history of falling.  The patient demonstrated bed mobility with supervision. Sat EOB for several minutes with good balance, both RN and MD entered to speak with pt. On Room air, sp02 >90% at rest, with ambulation dropped to 87% placed on 1-2L. Pt performed sit <> stand transfer from bed and BSC with supervision, and ambulated ~16ft with RW and CGA. Pt exhibited decreased gait velocity, overall steady, some shakiness noted. No complaints of light headedness/dizziness, spO2 monitored throughout mobility but unclear readings (stated it was in the 70s, when returned to sitting, 99%).  Overall the patient demonstrated deficits (see "PT Problem List") that impede the patient's functional abilities, safety, and mobility and would benefit from skilled PT intervention. Recommendation is HHPT.     Follow Up Recommendations Home health PT    Equipment Recommendations  None recommended by PT;Other (comment)(Pt has rollator and quad cane at home)    Recommendations for Other Services       Precautions / Restrictions  Precautions Precautions: Fall Restrictions Weight Bearing Restrictions: No      Mobility  Bed Mobility Overal bed mobility: Needs Assistance Bed Mobility: Supine to Sit     Supine to sit: Supervision        Transfers Overall transfer level: Needs assistance Equipment used: Rolling walker (2 wheeled) Transfers: Sit to/from Stand Sit to Stand: Supervision         General transfer comment: from bed and from [facility] over standard toilet  Ambulation/Gait Ambulation/Gait assistance: Min guard Gait Distance (Feet): 60 Feet Assistive device: Rolling walker (2 wheeled)       General Gait Details: Pt with decreased gait velocity, overall steady, some shakiness noted. No complaints of light headedness/dizziness, spO2 monitored throughout mobility but unclear readings.  Stairs            Wheelchair Mobility    Modified Rankin (Stroke Patients Only)       Balance Overall balance assessment: Mild deficits observed, not formally tested                                           Pertinent Vitals/Pain Pain Assessment: No/denies pain    Home Living Family/patient expects to be discharged to:: Private residence Living Arrangements: Alone Available Help at Discharge: Family Type of Home: Apartment(similar to mother in law suite attachment to home) Home Access: Ramped entrance     Home Layout: One level Home Equipment: Duque - quad;Shower seat;Bedside commode;Other (comment)      Prior Function Level of Independence: Independent  Comments: Pt was ambulating at home without any AD and used a quad cane for doctor appointments and generally does not leave the home much any more.     Hand Dominance   Dominant Hand: Right    Extremity/Trunk Assessment   Upper Extremity Assessment Upper Extremity Assessment: Generalized weakness    Lower Extremity Assessment Lower Extremity Assessment: Generalized weakness(grossly 4-/5)     Cervical / Trunk Assessment Cervical / Trunk Assessment: Normal  Communication   Communication: No difficulties  Cognition Arousal/Alertness: Awake/alert Behavior During Therapy: WFL for tasks assessed/performed Overall Cognitive Status: Within Functional Limits for tasks assessed                                        General Comments      Exercises Other Exercises Other Exercises: Pt able to sit EOB for several minutes with RN entered room, as well as MD on room air, good balance Other Exercises: Pt able to utilize commode with BSC over top with supervision   Assessment/Plan    PT Assessment Patient needs continued PT services  PT Problem List Decreased strength;Decreased balance;Decreased mobility;Cardiopulmonary status limiting activity;Decreased activity tolerance       PT Treatment Interventions DME instruction;Balance training;Neuromuscular re-education;Stair training;Gait training;Functional mobility training;Patient/family education;Therapeutic activities;Therapeutic exercise;Manual techniques    PT Goals (Current goals can be found in the Care Plan section)  Acute Rehab PT Goals Patient Stated Goal: to go home PT Goal Formulation: With patient Time For Goal Achievement: 04/06/19 Potential to Achieve Goals: Good    Frequency Min 2X/week   Barriers to discharge        Co-evaluation               AM-PAC PT "6 Clicks" Mobility  Outcome Measure Help needed turning from your back to your side while in a flat bed without using bedrails?: A Little Help needed moving from lying on your back to sitting on the side of a flat bed without using bedrails?: A Little Help needed moving to and from a bed to a chair (including a wheelchair)?: A Little Help needed standing up from a chair using your arms (e.g., wheelchair or bedside chair)?: A Little Help needed to walk in hospital room?: A Little Help needed climbing 3-5 steps with a railing? : A  Little 6 Click Score: 18    End of Session Equipment Utilized During Treatment: Gait belt;Oxygen(1-2L) Activity Tolerance: Patient tolerated treatment well Patient left: in chair;with call bell/phone within reach;with chair alarm set Nurse Communication: Mobility status PT Visit Diagnosis: Muscle weakness (generalized) (M62.81);History of falling (Z91.81);Difficulty in walking, not elsewhere classified (R26.2)    Time: [id number] PT Time Calculation (min) (ACUTE ONLY): 29 min   Charges:   PT Evaluation $PT Eval Low Complexity: 1 Low PT Treatments $Therapeutic Exercise: 8-22 mins       Lieutenant Diego PT, DPT 12:04 PM,03/23/19 [phone]

## 2019-03-23 NOTE — Progress Notes (Signed)
The pt scored a 7 on the RT protocol assessment. She is not SOB and no wheezes were heard. She states she is breathing much better. The nebulizer treatments are changed to TID.

## 2019-03-23 NOTE — TOC Initial Note (Signed)
Transition of Care [facility]) - Initial/Assessment Note    Patient Details  Name: ELEXYS HENLEY MRN: [id number] Date of Birth: [birth year]  Transition of Care [facility]) CM/SW Contact:    Ross Ludwig, LCSW Phone Number: 03/23/2019, 6:09 PM  Clinical Narrative:                  CSW spoke with patient and her daughter who was at bedside.  Patient says she has had home health set up in the past with [facility] she would like to only have a home health nurse.  She does not feel like she needs a home health PT at this time.  Patient lives with her daughters, and they are available to help her at home.  Patient and daughter felt like they did not any equipment because she has a lot of it already.  CSW spoke to [facility] and they can accept patient.  No other needs or issues.   Expected Discharge Plan: [place] Barriers to Discharge: Continued Medical Work up   Patient Goals and CMS Choice Patient states their goals for this hospitalization and ongoing recovery are:: To return back home. CMS Medicare.gov Compare Post Acute Care list provided to:: Patient Choice offered to / list presented to : Patient  Expected Discharge Plan and Services Expected Discharge Plan: [place] In-house Referral: Clinical Social Work   Post Acute Care Choice: [place] arrangements for the past 2 months: Single Family Home                 DME Arranged: N/A DME Agency: NA       HH Arranged: RN [place] Agency: [facility] Date [place]: 03/23/19 Time HH Agency Contacted: 80 Representative spoke with at [place]: [place] Arrangements/Services Living arrangements for the past 2 months: [place] Lives with:: Adult Children Patient language and need for interpreter reviewed:: Yes Do you feel safe going back to the place where you live?: Yes      Need for Family Participation in Patient Care: No (Comment) Care giver support  system in place?: Yes (comment) Current home services: DME Criminal Activity/Legal Involvement Pertinent to Current Situation/Hospitalization: No - Comment as needed  Activities of Daily Living Home Assistive Devices/Equipment: Eyeglasses, Cane (specify quad or straight), CPAP ADL Screening (condition at time of admission) Patient's cognitive ability adequate to safely complete daily activities?: Yes Is the patient deaf or have difficulty hearing?: Yes Does the patient have difficulty seeing, even when wearing glasses/contacts?: No Does the patient have difficulty concentrating, remembering, or making decisions?: No Patient able to express need for assistance with ADLs?: Yes Does the patient have difficulty dressing or bathing?: Yes Independently performs ADLs?: Yes (appropriate for developmental age) Does the patient have difficulty walking or climbing stairs?: Yes Weakness of Legs: Both Weakness of Arms/Hands: None  Permission Sought/Granted Permission sought to share information with : Family Supports Permission granted to share information with : Yes, Verbal Permission Granted  Share Information with NAME: Avaiyah, [place]ve[place] Daughter   [phone]  Permission granted to share info w AGENCY: Home Health Agencies        Emotional Assessment Appearance:: Appears stated age Attitude/Demeanor/Rapport: Engaged Affect (typically observed): Accepting, Appropriate, Pleasant, Stable Orientation: : Oriented to Self, Oriented to Place, Oriented to  Time, Oriented to Situation Alcohol / Substance Use: Not Applicable Psych Involvement: No (comment)  Admission diagnosis:  COPD exacerbation ([place]) [J44.1] Patient  Active Problem List   Diagnosis Date Noted  . COPD exacerbation ([place]) 03/22/2019  . Palliative care encounter 11/28/2018  . Shortness of breath 11/28/2018  . Chronic diastolic heart failure ([place]) 05/22/2018  . Acute systolic CHF (congestive heart failure) ([place]) 05/01/2018  . CAP  (community acquired pneumonia) 05/01/2018  . COPD with acute exacerbation ([place]) 05/01/2018  . Diabetes ([place]) 05/01/2018  . HTN (hypertension) 05/01/2018  . HLD (hyperlipidemia) 05/01/2018  . PAF (paroxysmal atrial fibrillation) ([place]) 05/01/2018   PCP:  [place] Pink, MD Pharmacy:   [id number] - [place], [place] - [address] [place] [address] Phone: [phone] Fax: [phone]     Social Determinants of Health (SDOH) Interventions    Readmission Risk Interventions No flowsheet data found.

## 2019-03-24 LAB — PROTIME-INR
INR: 2.7 — ABNORMAL HIGH (ref 0.8–1.2)
Prothrombin Time: 28.6 seconds — ABNORMAL HIGH (ref 11.4–15.2)

## 2019-03-24 LAB — BASIC METABOLIC PANEL
Anion gap: 11 (ref 5–15)
BUN: 47 mg/dL — ABNORMAL HIGH (ref 8–23)
CO2: 31 mmol/L (ref 22–32)
Calcium: 9.9 mg/dL (ref 8.9–10.3)
Chloride: 97 mmol/L — ABNORMAL LOW (ref 98–111)
Creatinine, Ser: 1.75 mg/dL — ABNORMAL HIGH (ref 0.44–1.00)
GFR calc Af Amer: 31 mL/min — ABNORMAL LOW (ref >60)
GFR calc non Af Amer: 27 mL/min — ABNORMAL LOW (ref >60)
Glucose, Bld: 138 mg/dL — ABNORMAL HIGH (ref 70–99)
Potassium: 3.8 mmol/L (ref 3.5–5.1)
Sodium: 139 mmol/L (ref 135–145)

## 2019-03-24 LAB — MAGNESIUM: Magnesium: 2.3 mg/dL (ref 1.7–2.4)

## 2019-03-24 MED ORDER — PREDNISONE 10 MG PO TABS: ORAL_TABLET | ORAL | 0 refills | Status: DC

## 2019-03-24 MED ORDER — FUROSEMIDE 20 MG PO TABS: 20.0000 mg | ORAL_TABLET | Freq: Two times a day (BID) | ORAL | 0 refills | Status: DC

## 2019-03-24 NOTE — Plan of Care (Signed)
  Problem: Education: Goal: Knowledge of General Education information will improve Description Including pain rating scale, medication(s)/side effects and non-pharmacologic comfort measures Outcome: Progressing   Problem: Clinical Measurements: Goal: Diagnostic test results will improve Outcome: Progressing Goal: Respiratory complications will improve Outcome: Progressing   

## 2019-03-24 NOTE — Progress Notes (Signed)
Ashley Bird to be D/C'd Home per MD order.  Discussed prescriptions and follow up appointments with the patient. Prescriptions given to patient, medication list explained in detail. Pt verbalized understanding.  Allergies as of 03/24/2019      Reactions   Alendronate Other (See Comments)   Reaction: unknown   Doxycycline Swelling   Swelling and hemmorage      Effexor [venlafaxine] Other (See Comments)   Body ached   Ferralet [iron-folic Q000111Q Nausea And Vomiting   Vomiting   Ferrous Sulfate Nausea And Vomiting   Penicillins Swelling   Per pt: swelling and hemmorage    Prozac [fluoxetine] Other (See Comments)   Reaction: unknown   Simvastatin Other (See Comments)   Muscle pain   Sulfa Antibiotics Other (See Comments)   Reaction: unknown   Zoloft [sertraline Hcl] Other (See Comments)   Reaction: unknown      Medication List    TAKE these medications   acetaminophen 325 MG tablet Commonly known as: TYLENOL Take 325 mg by mouth every 6 (six) hours as needed. Takes with Tramadol   Advair Diskus 100-50 MCG/DOSE Aepb Generic drug: Fluticasone-Salmeterol Inhale 1 puff into the lungs every 12 (twelve) hours.   albuterol 1.25 MG/3ML nebulizer solution Commonly known as: ACCUNEB Inhale 1 ampule into the lungs every 4 (four) hours as needed for wheezing or shortness of breath.   albuterol 108 (90 Base) MCG/ACT inhaler Commonly known as: VENTOLIN HFA Inhale 2 puffs into the lungs every 4 (four) hours as needed for wheezing or shortness of breath.   amiodarone 200 MG tablet Commonly known as: PACERONE Take 200 mg by mouth daily.   b complex vitamins tablet Take 1 tablet by mouth daily.   calcium carbonate 1500 (600 Ca) MG Tabs tablet Commonly known as: OSCAL Take 600 mg of elemental calcium by mouth daily with breakfast.   cetirizine 10 MG tablet Commonly known as: ZYRTEC Take 10 mg by mouth every other day.   furosemide 20 MG tablet Commonly known as:  LASIX Take 1 tablet (20 mg total) by mouth 2 (two) times daily. What changed:   when to take this  reasons to take this   Melatonin 1 MG Tabs Take 1 mg by mouth at bedtime as needed (sleep).   metoprolol succinate 50 MG 24 hr tablet Commonly known as: TOPROL-XL Take 50 mg by mouth daily.   Multivitamin Gummies Womens [employer] 1 tablet by mouth daily.   olmesartan-hydrochlorothiazide 40-25 MG tablet Commonly known as: BENICAR HCT Take 1 tablet by mouth daily.   predniSONE 10 MG tablet Commonly known as: DELTASONE 40 mg p.o. daily x2 days 1030 mg p.o. daily x2 days Then 20 mg p.o. daily x2 days Then 10 mg p.o. daily x2 days   traMADol 50 MG tablet Commonly known as: ULTRAM Take 50-100 mg by mouth every 4 (four) hours as needed for moderate pain. Takes with Warfarin   Vitamin D3 125 MCG (5000 UT) Tabs Take 1 tablet by mouth daily.   warfarin 2 MG tablet Commonly known as: COUMADIN Take 2 mg by mouth daily.       Vitals:   03/24/19 0400 03/24/19 0824  BP: (!) 141/68 127/90  Pulse: 75 82  Resp: 16 (!) 21  Temp: 98.6 F (37 C) 98.4 F (36.9 C)  SpO2: 95% 96%    Skin clean, dry and intact without evidence of skin break down, no evidence of skin tears noted. IV catheter discontinued intact. Site without signs  and symptoms of complications. Dressing and pressure applied. Pt denies pain at this time. No complaints noted.  An After Visit Summary was printed and given to the patient. Patient escorted via [place], and D/C home via private auto.  Ashley Bird

## 2019-03-24 NOTE — Discharge Summary (Signed)
[facility] [place] NAME: Ashley Bird    MR#:  [id number]  DATE OF BIRTH:  [birth year]  DATE OF ADMISSION:  03/22/2019   ADMITTING PHYSICIAN: Christel Mormon, MD  DATE OF DISCHARGE: 03/24/2019  PRIMARY CARE PHYSICIAN: Maryland Pink, MD   ADMISSION DIAGNOSIS:  COPD exacerbation ([place]) [J44.1] DISCHARGE DIAGNOSIS:  Active Problems:   COPD exacerbation ([place])  SECONDARY DIAGNOSIS:   Past Medical History:  Diagnosis Date  . CAD (coronary artery disease)   . CHF (congestive heart failure) ([place])   . COPD (chronic obstructive pulmonary disease) ([place])   . Diabetes ([place])   . HLD (hyperlipidemia)   . HTN (hypertension)   . PAF (paroxysmal atrial fibrillation) [facility])    HOSPITAL COURSE:  Chief complaint; shortness of breath and chest pain  History of presenting complaint; Ashley Bird  is a 83 y.o. Caucasian female with a known history of multiple medical problems that including COPD, CAD, CHF and type diabetes mellitus, presented to the emergency room with concern of worsening dyspnea over the last couple of days with associated wheezing without significant cough.  Patient was diagnosed with COPD exacerbation and admitted to medical service.  Hospital course; 1. COPD acute exacerbation like secondary to acute bronchitis.  Treated and significantly improved clinically.  Continue with p.o. prednisone taper and inhalers at home as previously.  Patient back to baseline.  Already weaned off oxygen with oxygen saturation of 95% on room air this morning.  Clinically and hemodynamically stable and requesting for discharge home today.  Patient seen by physical therapist.  Recommended home health with physical therapy on discharge.  Case manager to set up on discharge  2. Acute on chronic diastolic CHF.  Significantly improved with diuresis with IV Lasix.  Being discharged on p.o. Lasix.  Continue p.o. metoprolol.  Recent echo showed an EF of 55 to 60%  with moderate aortic stenosis mild mitral regurgitation and moderate left atrial dilatation as well as mild right atrial dilatation in October 2019.  3. Atrial fibrillation with rapid response.  Heart rate controlled with metoprolol.  Continue with Coumadin.  INR therapeutic at 2.7.  Outpatient monitoring of INR by primary care physician  4. Hypertension. Continue Toprol-XL, irbesartan -HCTZ..  CKD stage III.  Fairly Stable.  Follow-up BMP while on Lasix.   DISCHARGE CONDITIONS:  Stable CONSULTS OBTAINED:   DRUG ALLERGIES:   Allergies  Allergen Reactions  . Alendronate Other (See Comments)    Reaction: unknown  . Doxycycline Swelling    Swelling and hemmorage     . Effexor [Venlafaxine] Other (See Comments)    Body ached   . Ferralet [Iron-Folic 123456 Nausea And Vomiting    Vomiting   . Ferrous Sulfate Nausea And Vomiting  . Penicillins Swelling    Per pt: swelling and hemmorage     . Prozac [Fluoxetine] Other (See Comments)    Reaction: unknown  . Simvastatin Other (See Comments)    Muscle pain   . Sulfa Antibiotics Other (See Comments)    Reaction: unknown  . Zoloft [Sertraline Hcl] Other (See Comments)    Reaction: unknown   DISCHARGE MEDICATIONS:   Allergies as of 03/24/2019      Reactions   Alendronate Other (See Comments)   Reaction: unknown   Doxycycline Swelling   Swelling and hemmorage      Effexor [venlafaxine] Other (See Comments)   Body ached   Ferralet [iron-folic Q000111Q Nausea And Vomiting   Vomiting  Ferrous Sulfate Nausea And Vomiting   Penicillins Swelling   Per pt: swelling and hemmorage    Prozac [fluoxetine] Other (See Comments)   Reaction: unknown   Simvastatin Other (See Comments)   Muscle pain   Sulfa Antibiotics Other (See Comments)   Reaction: unknown   Zoloft [sertraline Hcl] Other (See Comments)   Reaction: unknown      Medication List    TAKE these medications   acetaminophen 325  MG tablet Commonly known as: TYLENOL Take 325 mg by mouth every 6 (six) hours as needed. Takes with Tramadol   Advair Diskus 100-50 MCG/DOSE Aepb Generic drug: Fluticasone-Salmeterol Inhale 1 puff into the lungs every 12 (twelve) hours.   albuterol 1.25 MG/3ML nebulizer solution Commonly known as: ACCUNEB Inhale 1 ampule into the lungs every 4 (four) hours as needed for wheezing or shortness of breath.   albuterol 108 (90 Base) MCG/ACT inhaler Commonly known as: VENTOLIN HFA Inhale 2 puffs into the lungs every 4 (four) hours as needed for wheezing or shortness of breath.   amiodarone 200 MG tablet Commonly known as: PACERONE Take 200 mg by mouth daily.   b complex vitamins tablet Take 1 tablet by mouth daily.   calcium carbonate 1500 (600 Ca) MG Tabs tablet Commonly known as: OSCAL Take 600 mg of elemental calcium by mouth daily with breakfast.   cetirizine 10 MG tablet Commonly known as: ZYRTEC Take 10 mg by mouth every other day.   furosemide 20 MG tablet Commonly known as: LASIX Take 1 tablet (20 mg total) by mouth 2 (two) times daily. What changed:   when to take this  reasons to take this   Melatonin 1 MG Tabs Take 1 mg by mouth at bedtime as needed (sleep).   metoprolol succinate 50 MG 24 hr tablet Commonly known as: TOPROL-XL Take 50 mg by mouth daily.   Multivitamin Gummies Womens [employer] 1 tablet by mouth daily.   olmesartan-hydrochlorothiazide 40-25 MG tablet Commonly known as: BENICAR HCT Take 1 tablet by mouth daily.   predniSONE 10 MG tablet Commonly known as: DELTASONE 40 mg p.o. daily x2 days 1030 mg p.o. daily x2 days Then 20 mg p.o. daily x2 days Then 10 mg p.o. daily x2 days   traMADol 50 MG tablet Commonly known as: ULTRAM Take 50-100 mg by mouth every 4 (four) hours as needed for moderate pain. Takes with Warfarin   Vitamin D3 125 MCG (5000 UT) Tabs Take 1 tablet by mouth daily.   warfarin 2 MG tablet Commonly known as:  COUMADIN Take 2 mg by mouth daily.        DISCHARGE INSTRUCTIONS:   DIET:  Low-sodium diet restriction DISCHARGE CONDITION:  Stable ACTIVITY:  Activity as tolerated OXYGEN:  Home Oxygen: No.  Oxygen Delivery: room air DISCHARGE LOCATION:  home   If you experience worsening of your admission symptoms, develop shortness of breath, life threatening emergency, suicidal or homicidal thoughts you must seek medical attention immediately by calling 911 or calling your MD immediately  if symptoms less severe.  You Must read complete instructions/literature along with all the possible adverse reactions/side effects for all the Medicines you take and that have been prescribed to you. Take any new Medicines after you have completely understood and accpet all the possible adverse reactions/side effects.   Please note  You were cared for by a hospitalist during your hospital stay. If you have any questions about your discharge medications or the care you received while you were  in the hospital after you are discharged, you can call the unit and asked to speak with the hospitalist on call if the hospitalist that took care of you is not available. Once you are discharged, your primary care physician will handle any further medical issues. Please note that NO REFILLS for any discharge medications will be authorized once you are discharged, as it is imperative that you return to your primary care physician (or establish a relationship with a primary care physician if you do not have one) for your aftercare needs so that they can reassess your need for medications and monitor your lab values.    On the day of Discharge:  VITAL SIGNS:  Blood pressure 127/90, pulse 82, temperature 98.4 F (36.9 C), temperature source Oral, resp. rate (!) 21, height 5\' 2"  (1.575 m), weight 81.5 kg, SpO2 96 %. PHYSICAL EXAMINATION:  GENERAL:  83 y.o.-year-old patient lying in the bed with no acute distress.  EYES:  Pupils equal, round, reactive to light and accommodation. No scleral icterus. Extraocular muscles intact.  HEENT: Head atraumatic, normocephalic. Oropharynx and nasopharynx clear.  NECK:  Supple, no jugular venous distention. No thyroid enlargement, no tenderness.  LUNGS: Normal breath sounds bilaterally, no wheezing, rales,rhonchi or crepitation. No use of accessory muscles of respiration.  CARDIOVASCULAR: S1, S2 normal. No murmurs, rubs, or gallops.  ABDOMEN: Soft, non-tender, non-distended. Bowel sounds present. No organomegaly or mass.  EXTREMITIES: No pedal edema, cyanosis, or clubbing.  NEUROLOGIC: Cranial nerves II through XII are intact. Muscle strength 5/5 in all extremities. Sensation intact. Gait not checked.  PSYCHIATRIC: The patient is alert and oriented x 3.  SKIN: No obvious rash, lesion, or ulcer.  DATA REVIEW:   CBC Recent Labs  Lab 03/23/19 0524  WBC 11.5*  HGB 10.6*  HCT 34.0*  PLT 198    Chemistries  Recent Labs  Lab 03/24/19 0705  NA 139  K 3.8  CL 97*  CO2 31  GLUCOSE 138*  BUN 47*  CREATININE 1.75*  CALCIUM 9.9  MG 2.3     Microbiology Results  Results for orders placed or performed during the hospital encounter of 03/22/19  SARS Coronavirus 2 [facility] order, Performed in [facility] hospital lab) Nasopharyngeal Nasopharyngeal Swab     Status: None   Collection Time: 03/22/19  3:27 AM   Specimen: Nasopharyngeal Swab  Result Value Ref Range Status   SARS Coronavirus 2 NEGATIVE NEGATIVE Final    Comment: (NOTE) If result is NEGATIVE SARS-CoV-2 target nucleic acids are NOT DETECTED. The SARS-CoV-2 RNA is generally detectable in upper and lower  respiratory specimens during the acute phase of infection. The lowest  concentration of SARS-CoV-2 viral copies this assay can detect is 250  copies / mL. A negative result does not preclude SARS-CoV-2 infection  and should not be used as the sole basis for treatment or other  patient management  decisions.  A negative result may occur with  improper specimen collection / handling, submission of specimen other  than nasopharyngeal swab, presence of viral mutation(s) within the  areas targeted by this assay, and inadequate number of viral copies  (<250 copies / mL). A negative result must be combined with clinical  observations, patient history, and epidemiological information. If result is POSITIVE SARS-CoV-2 target nucleic acids are DETECTED. The SARS-CoV-2 RNA is generally detectable in upper and lower  respiratory specimens dur ing the acute phase of infection.  Positive  results are indicative of active infection with SARS-CoV-2.  Clinical  correlation with patient history and other diagnostic information is  necessary to determine patient infection status.  Positive results do  not rule out bacterial infection or co-infection with other viruses. If result is PRESUMPTIVE POSTIVE SARS-CoV-2 nucleic acids MAY BE PRESENT.   A presumptive positive result was obtained on the submitted specimen  and confirmed on repeat testing.  While 2019 novel coronavirus  (SARS-CoV-2) nucleic acids may be present in the submitted sample  additional confirmatory testing may be necessary for epidemiological  and / or clinical management purposes  to differentiate between  SARS-CoV-2 and other Sarbecovirus currently known to infect humans.  If clinically indicated additional testing with an alternate test  methodology [phone]) is advised. The SARS-CoV-2 RNA is generally  detectable in upper and lower respiratory sp ecimens during the acute  phase of infection. The expected result is Negative. Fact Sheet for Patients:  StrictlyIdeas.no Fact Sheet for Healthcare Providers: BankingDealers.co.za This test is not yet approved or cleared by the [place] FDA and has been authorized for detection and/or diagnosis of SARS-CoV-2 by FDA under an  Emergency Use Authorization (EUA).  This EUA will remain in effect (meaning this test can be used) for the duration of the COVID-19 declaration under Section 564(b)(1) of the Act, 21 U.S.C. section 360bbb-3(b)(1), unless the authorization is terminated or revoked sooner. Performed at [facility], [address] [place], [place] [address]   Culture, blood (routine x 2) Call MD if unable to obtain prior to antibiotics being given     Status: None (Preliminary result)   Collection Time: 03/22/19  6:36 AM   Specimen: BLOOD  Result Value Ref Range Status   Specimen Description BLOOD RIGHT HAND  Final   Special Requests   Final    BOTTLES DRAWN AEROBIC AND ANAEROBIC Blood Culture adequate volume   Culture   Final    NO GROWTH 2 DAYS Performed at [facility], [address], [place], [place] [address]    Report Status PENDING  Incomplete  Culture, blood (routine x 2) Call MD if unable to obtain prior to antibiotics being given     Status: None (Preliminary result)   Collection Time: 03/22/19  3:21 PM   Specimen: BLOOD  Result Value Ref Range Status   Specimen Description BLOOD RIGHT ANTECUBITAL  Final   Special Requests   Final    BOTTLES DRAWN AEROBIC AND ANAEROBIC Blood Culture results may not be optimal due to an excessive volume of blood received in culture bottles   Culture   Final    NO GROWTH 2 DAYS Performed at [facility], [address] [place], [place] [address]    Report Status PENDING  Incomplete    RADIOLOGY:  No results found.   Management plans discussed with the patient, family and they are in agreement.  CODE STATUS: DNR   TOTAL TIME TAKING CARE OF THIS PATIENT: 36 minutes.    Ashley Bird M.D on 03/24/2019 at 10:39 AM  Between 7am to 6pm - Pager - [phone]  After 6pm go to [email or web address] - Proofreader  [facility]  Office  [phone]  CC: Primary care physician; Maryland Pink, MD    Note: This dictation was prepared with Dragon dictation along with smaller phrase technology. Any transcriptional errors that result from this process are unintentional.

## 2019-03-24 NOTE — Plan of Care (Signed)
Patient improved to be discharged today.

## 2019-03-24 NOTE — TOC Transition Note (Signed)
Transition of Care [facility]) - CM/SW Discharge Note   Patient Details  Name: Ashley Bird MRN: [id number] Date of Birth: 22-Apr-1936  Transition of Care [facility]) CM/SW Contact:  Ross Ludwig, LCSW Phone Number: 03/24/2019, 2:46 PM   Clinical Narrative:     CSW was informed that patient is discharging today.  CSW made referral to [facility] for a home health RN per patient's request she did not want [place] [facility].  Patient agreeable to [facility] RN, CSW was informed by [facility] they can accept her, but it may not be till Wednesday or Thursday that a nurse will be able to see her.  CSW updated patient and her daughter who was at bedside and they said that is fine.  CSW to sign off please reconsult if social work needs arise.   Final next level of care: [place] Barriers to Discharge: Barriers Resolved   Patient Goals and CMS Choice Patient states their goals for this hospitalization and ongoing recovery are:: To return back home with home health RN. CMS Medicare.gov Compare Post Acute Care list provided to:: Patient Choice offered to / list presented to : Patient  Discharge Placement                       Discharge Plan and Services In-house Referral: Clinical Social Work   Post Acute Care Choice: Home Health          DME Arranged: N/A DME Agency: NA       HH Arranged: RN [place] Agency: [facility] Date [place] Agency Contacted: 03/23/19 Time [place]: [id number] Representative spoke with at [place]: [place]  Social Determinants of Health ([place]) Interventions     Readmission Risk Interventions No flowsheet data found.

## 2019-03-24 NOTE — Progress Notes (Addendum)
[facility] calledf and reported that pt have a run of wide QRS non sustain. Pt was not on any distress on assessment. VSS.Notify prime. Will continue to monitor.  Update 0054: Dr. Manuella Ghazi made aware but no new order was place. Will continue to monitor.

## 2019-03-27 LAB — CULTURE, BLOOD (ROUTINE X 2)
Culture: NO GROWTH
Culture: NO GROWTH
Special Requests: ADEQUATE

## 2019-03-28 ENCOUNTER — Telehealth: Payer: Self-pay | Admitting: Family

## 2019-03-28 ENCOUNTER — Telehealth: Payer: Self-pay

## 2019-03-28 NOTE — Telephone Encounter (Signed)
Received phone call from daughter Nadene Dahlen regarding her mom. She says that she's currently taking furosemide 20mg  BID along with prednisone and has developed lower quadrant abdominal pain. Denies any constipation and daughter says that her stools are actually looser than they were previously. Denies any swelling in her legs and daughter feels like patient may be dehydrated.   Has appointment with PCP tomorrow regarding her abdominal pain. Advised Ms Ashley Bird to decrease patient's furosemide to once daily and use the 2nd dose as an as needed dose based on weight gain, swelling or shortness of breath. Depending on PCP's exam and patient's BP tomorrow, she may not even need daily furosemide.   Daughter says that she tried to get patient to go to the ED regarding her abdominal pain but patient has refused. Patient has agreed to see her PCP tomorrow.

## 2019-03-28 NOTE — Telephone Encounter (Signed)
Follow up visit with Palliative Care scheduled for 04/02/19

## 2019-03-28 NOTE — Telephone Encounter (Signed)
Received message from patient's daughter to return call. Call placed to daughter, Ashley Bird. Ashley Bird reported that patient has had lower abdominal pain that is consistent across lower abdomen. Patient denies any dysuria or lower back pain. Afebrile. No nausea or vomiting but did have diarrhea yesterday. Patient recently d/c from hospital on 03/23/2019. Pain began on Sunday 03/25/2019 and at times awakens her from her sleep. Spoke with Christin NP who advised that patient needs a follow up with PCP and if pain worsens any at all that patient needs to be evaluated at ED. Patient has appointment with PCP on 03/29/2019. Daughter expressed understanding about taking patient to ED. Patient is currently refusing to go to ED.

## 2019-03-30 ENCOUNTER — Telehealth: Payer: Self-pay

## 2019-03-30 NOTE — Telephone Encounter (Signed)
Daughter called to report patient is declining since hospital discharge last week, not eating much and in a lot of pain. Follow up visit yesterday with MD and he is aware. Daughter interested in hospice. Notified Palliative NP that is seeing her on Monday 04/02/19.

## 2019-03-30 NOTE — Telephone Encounter (Signed)
Called received from Dr. Verneda Skill office stating that Dr. Verneda Skill was out of the office today, but he was in agreement with hospice screening. Information passed to palliative NP and hospice referral intake to proceed. Report to [facility] hospice team. Dr. Barbarann Ehlers office was calling daughter to give further advice on medications

## 2019-03-30 NOTE — Telephone Encounter (Signed)
RN spoke with NP [place] and she suggested to call MD office to give the latest info that daughter provided.  RN called Dr. Barbarann Ehlers office to let them know what th daughter's concerns were staff at MD office will give Dr. Kary Kos the message and details.

## 2019-04-02 ENCOUNTER — Other Ambulatory Visit: Payer: Self-pay

## 2019-04-02 ENCOUNTER — Other Ambulatory Visit: Payer: Medicare Other | Admitting: Nurse Practitioner

## 2019-04-05 NOTE — Progress Notes (Deleted)
Patient ID: Ashley Bird, female    DOB: Nov 25, 1935, 83 y.o.   MRN: [id number]  HPI  Ashley Bird is a 83 y/o female with a history of CAD, DM, hyperlipidemia, HTN, paroxysmal atrial fibrillation, COPD, previous tobacco use and chronic heart failure.   Echo report from 05/02/18 reviewed and showed an EF of 55-65% along with mild MR, moderate AS and an elevated PA pressure of 41 mm Hg.   Admitted 03/22/2019 due to COPD exacerbation due to bronchitis along with acute on chronic HF. Given prednisone and inhalers. Given IV lasix and then transitioned to oral diuretics. Discharged after 2 days.   She presents today for a follow-up visit with a chief complaint of   Past Medical History:  Diagnosis Date  . CAD (coronary artery disease)   . CHF (congestive heart failure) ([place])   . COPD (chronic obstructive pulmonary disease) ([place])   . Diabetes ([place])   . HLD (hyperlipidemia)   . HTN (hypertension)   . PAF (paroxysmal atrial fibrillation) ([place])    Past Surgical History:  Procedure Laterality Date  . ABDOMINAL HYSTERECTOMY    . BREAST EXCISIONAL BIOPSY Left 40 yrs ago   neg  . BREAST EXCISIONAL BIOPSY Left 40 yrs ago   neg   Family History  Problem Relation Age of Onset  . Colon cancer Mother   . Diabetes Mother   . Asthma Father    Social History   Tobacco Use  . Smoking status: Former Smoker    Packs/day: 2.00    Years: 30.00    Pack years: 60.00    Types: Cigarettes    Quit date: 05/1979    Years since quitting: 39.9  . Smokeless tobacco: Never Used  Substance Use Topics  . Alcohol use: Not Currently   Allergies  Allergen Reactions  . Alendronate Other (See Comments)    Reaction: unknown  . Doxycycline Swelling    Swelling and hemmorage     . Effexor [Venlafaxine] Other (See Comments)    Body ached   . Ferralet [Iron-Folic 123456 Nausea And Vomiting    Vomiting   . Ferrous Sulfate Nausea And Vomiting  . Penicillins Swelling    Per pt: swelling and  hemmorage     . Prozac [Fluoxetine] Other (See Comments)    Reaction: unknown  . Simvastatin Other (See Comments)    Muscle pain   . Sulfa Antibiotics Other (See Comments)    Reaction: unknown  . Zoloft [Sertraline Hcl] Other (See Comments)    Reaction: unknown     Review of Systems  Constitutional: Positive for fatigue. Negative for appetite change.  HENT: Positive for congestion. Negative for rhinorrhea and sore throat.   Eyes: Negative.   Respiratory: Positive for shortness of breath (with minimal exertion). Negative for cough.   Cardiovascular: Negative for chest pain, palpitations and leg swelling.  Gastrointestinal: Negative for abdominal distention and abdominal pain.  Endocrine: Negative.   Genitourinary: Negative.   Musculoskeletal: Positive for back pain (chronic). Negative for neck pain.  Skin: Negative.   Allergic/Immunologic: Negative.   Neurological: Negative for dizziness and light-headedness.  Hematological: Negative for adenopathy. Does not bruise/bleed easily.  Psychiatric/Behavioral: Negative for dysphoric mood and sleep disturbance (sleeping on 2 pillows). The patient is not nervous/anxious.      Physical Exam Vitals signs and nursing note reviewed.  Constitutional:      Appearance: She is well-developed.  HENT:     Head: Normocephalic and atraumatic.  Neck:  Musculoskeletal: Normal range of motion and neck supple.     Vascular: No JVD.  Cardiovascular:     Rate and Rhythm: Normal rate and regular rhythm.  Pulmonary:     Effort: Pulmonary effort is normal. No respiratory distress.     Breath sounds: No rhonchi or rales.  Abdominal:     General: There is no distension.     Palpations: Abdomen is soft.  Musculoskeletal:     Right lower leg: She exhibits no tenderness. No edema.     Left lower leg: She exhibits no tenderness. No edema.  Skin:    General: Skin is warm and dry.  Neurological:     Mental Status: She is alert and oriented to  person, place, and time.  Psychiatric:        Behavior: Behavior normal.    Assessment & Plan:  1: Chronic heart failure with preserved ejection fraction- - NYHA class III - euvolemic today - weighing daily and she was reminded to call for an overnight weight gain of >2 pounds or a weekly weight gain of >5 pounds - weight 190.2 pounds since she was last here ~8 months ago - not adding salt to her food and her daughter has been reading food labels for sodium content. Reminded to closely follow a 2000mg  sodium diet  - saw cardiology Minette Brine) 08/14/2018 - BNP 05/01/18 was 991.0 - saw pulmonology Alva Garnet) 10/30/2018 - palliative care visit done 03/08/2019 - home health has finished  2: HTN- - BP looks good today - saw PCP Kary Kos) 03/29/2019 - BMP 03/24/2019 reviewed and showed sodium 139, potassium 3.8, creatinine 1.75 and GFR 27   Patient did not bring her medications nor a list. Each medication was verbally reviewed with the patient and she was encouraged to bring the bottles to every visit to confirm accuracy of list.

## 2019-04-06 ENCOUNTER — Ambulatory Visit: Payer: Medicare Other | Admitting: Family

## 2019-04-12 ENCOUNTER — Other Ambulatory Visit: Payer: Medicare Other | Admitting: Nurse Practitioner

## 2019-04-12 ENCOUNTER — Other Ambulatory Visit: Payer: Self-pay

## 2019-04-16 ENCOUNTER — Ambulatory Visit: Payer: Medicare Other | Admitting: Family

## 2019-08-24 ENCOUNTER — Telehealth: Payer: Self-pay | Admitting: Nurse Practitioner

## 2019-08-24 NOTE — Telephone Encounter (Signed)
Spoke with patient regarding Palliative services and she was in agreement with this.  I have scheduled a Telephone Consult for 09/24/19 @ 1 PM.

## 2019-09-24 ENCOUNTER — Encounter: Payer: Self-pay | Admitting: Nurse Practitioner

## 2019-09-24 ENCOUNTER — Other Ambulatory Visit: Payer: Self-pay

## 2019-09-24 ENCOUNTER — Other Ambulatory Visit: Payer: Medicare Other | Admitting: Nurse Practitioner

## 2019-09-24 DIAGNOSIS — J209 Acute bronchitis, unspecified: Secondary | ICD-10-CM

## 2019-09-24 DIAGNOSIS — Z515 Encounter for palliative care: Secondary | ICD-10-CM

## 2019-09-24 NOTE — Progress Notes (Signed)
[place] Consult Note Telephone: [phone]  Fax: [phone]  PATIENT NAME: SENG FOUTS DOB: 1936-07-09 MRN: [id number]  PRIMARY CARE PROVIDER:   Maryland Pink, MD  REFERRING PROVIDER:  Maryland Pink, MD [address] [facility] [place],  [place] [address]  RESPONSIBLE PARTY:   Self  Due to the COVID-19 crisis, this visit was done via telemedicine/Telehealth is it by telephone as video equipment not functioning for Ms. Rogersfrom my office and it was initiated and consent by this patient and or family.  RECOMMENDATIONS and PLAN: 1.ACP: DNR; continue to treat what is treatable, avoid ICU, wishes are not to be intubated. Discussed MOST form and hard choice book, in agreement to have blank MOST form and hard choice book for review and will re-visit at next [facility] visit.   2.Dyspneic R06.00 secondary toCOPDremain stable at present time.Continue CPAP, albuterol as needed, advair  I spent 45 minutes providing this consultation,  from 1:00pm to 1:45pm. More than 50% of the time in this consultation was spent coordinating communication.   HISTORY OF PRESENT ILLNESS:  Ashley Bird is a 84 y.o. year old female with multiple medical problems including Paroxysmal atrial fibrillation, COPD, congestive heart failure, coronary artery disease, diabetes, hyperlipidemia, hypertension, abdominal hysterectomy.[facility]. I called Ms. Lasota for schedule palliative care follow-up visit telemedicine telefonica's video not available. Ms. Fetsch and I talked about purpose of palliative care visit. We talked about how she has been feeling. Ms Keetch endorses she is doing well. She just received her second covid vaccine. Praised Ms. Kizer for doing that. We talked about functionally she continues to be independent. Appetite has been good. No weight loss. Ms Longan endorses no symptoms the pain or shortness of breath.  Ms Gioffre endorses that she actually got to go eat at a restaurant for the first time in a year. She felt very good about that. We talked about medical goals of care. We talked about role of palliative care and plan of care. Discuss with Ms Picker she is doing well will follow up in two months if needed or sooner should she declined. Ms Gable in agreement. Therapeutic listening and emotional support provided. Questions answered the satisfaction. Contact information provided. Palliative Care was asked to help to continue to address goals of care.   CODE STATUS: DNR  PPS: 60% HOSPICE ELIGIBILITY/DIAGNOSIS: TBD  PAST MEDICAL HISTORY:  Past Medical History:  Diagnosis Date  . CAD (coronary artery disease)   . CHF (congestive heart failure) ([place])   . COPD (chronic obstructive pulmonary disease) ([place])   . Diabetes ([place])   . HLD (hyperlipidemia)   . HTN (hypertension)   . PAF (paroxysmal atrial fibrillation) ([place])     SOCIAL HX:  Social History   Tobacco Use  . Smoking status: Former Smoker    Packs/day: 2.00    Years: 30.00    Pack years: 60.00    Types: Cigarettes    Quit date: 05/1979    Years since quitting: 40.3  . Smokeless tobacco: Never Used  Substance Use Topics  . Alcohol use: Not Currently    ALLERGIES:  Allergies  Allergen Reactions  . Alendronate Other (See Comments)    Reaction: unknown  . Doxycycline Swelling    Swelling and hemmorage     . Effexor [Venlafaxine] Other (See Comments)    Body ached   . Ferralet [Iron-Folic [id number]] Nausea And Vomiting    Vomiting   .  Ferrous Sulfate Nausea And Vomiting  . Penicillins Swelling    Per pt: swelling and hemmorage     . Prozac [Fluoxetine] Other (See Comments)    Reaction: unknown  . Simvastatin Other (See Comments)    Muscle pain   . Sulfa Antibiotics Other (See Comments)    Reaction: unknown  . Zoloft [Sertraline Hcl] Other (See Comments)    Reaction: unknown     PERTINENT MEDICATIONS:    Outpatient Encounter Medications as of 09/24/2019  Medication Sig  . acetaminophen (TYLENOL) 325 MG tablet Take 325 mg by mouth every 6 (six) hours as needed. Takes with Tramadol  . ADVAIR DISKUS 100-50 MCG/DOSE AEPB Inhale 1 puff into the lungs every 12 (twelve) hours.  Marland Kitchen albuterol (ACCUNEB) 1.25 MG/3ML nebulizer solution Inhale 1 ampule into the lungs every 4 (four) hours as needed for wheezing or shortness of breath.   Marland Kitchen albuterol (PROVENTIL HFA;VENTOLIN HFA) 108 (90 Base) MCG/ACT inhaler Inhale 2 puffs into the lungs every 4 (four) hours as needed for wheezing or shortness of breath.   Marland Kitchen amiodarone (PACERONE) 200 MG tablet Take 200 mg by mouth daily.  Marland Kitchen b complex vitamins tablet Take 1 tablet by mouth daily.  . calcium carbonate (OSCAL) 1500 (600 Ca) MG TABS tablet Take 600 mg of elemental calcium by mouth daily with breakfast.  . cetirizine (ZYRTEC) 10 MG tablet Take 10 mg by mouth every other day.   . Cholecalciferol (VITAMIN D3) 5000 units TABS Take 1 tablet by mouth daily.  . furosemide (LASIX) 20 MG tablet Take 1 tablet (20 mg total) by mouth 2 (two) times daily.  . Melatonin 1 MG TABS Take 1 mg by mouth at bedtime as needed (sleep).   . metoprolol succinate (TOPROL-XL) 50 MG 24 hr tablet Take 50 mg by mouth daily.  . Multiple Vitamins-Minerals (MULTIVITAMIN GUMMIES WOMENS) CHEW Chew 1 tablet by mouth daily.  Marland Kitchen olmesartan-hydrochlorothiazide (BENICAR HCT) 40-25 MG tablet Take 1 tablet by mouth daily.  . predniSONE (DELTASONE) 10 MG tablet 40 mg p.o. daily x2 days 1030 mg p.o. daily x2 days Then 20 mg p.o. daily x2 days Then 10 mg p.o. daily x2 days  . traMADol (ULTRAM) 50 MG tablet Take 50-100 mg by mouth every 4 (four) hours as needed for moderate pain. Takes with Warfarin  . warfarin (COUMADIN) 2 MG tablet Take 2 mg by mouth daily.    No facility-administered encounter medications on file as of 09/24/2019.    PHYSICAL EXAM:  Deferred  Becky Colan Z Alann Avey, NP

## 2019-11-04 ENCOUNTER — Emergency Department: Payer: Medicare Other

## 2019-11-04 ENCOUNTER — Inpatient Hospital Stay
Admission: EM | Admit: 2019-11-04 | Discharge: 2019-11-07 | DRG: 190 | Disposition: A | Discharge status: Home Health | Payer: Medicare Other | Attending: Internal Medicine | Admitting: Internal Medicine

## 2019-11-04 ENCOUNTER — Other Ambulatory Visit: Payer: Self-pay

## 2019-11-04 DIAGNOSIS — Z800 Family history of malignant neoplasm of digestive organs: Secondary | ICD-10-CM

## 2019-11-04 DIAGNOSIS — N184 Chronic kidney disease, stage 4 (severe): Secondary | ICD-10-CM

## 2019-11-04 DIAGNOSIS — R0602 Shortness of breath: Secondary | ICD-10-CM | POA: Diagnosis not present

## 2019-11-04 DIAGNOSIS — J9601 Acute respiratory failure with hypoxia: Secondary | ICD-10-CM | POA: Diagnosis present

## 2019-11-04 DIAGNOSIS — Z66 Do not resuscitate: Secondary | ICD-10-CM | POA: Diagnosis present

## 2019-11-04 DIAGNOSIS — Z825 Family history of asthma and other chronic lower respiratory diseases: Secondary | ICD-10-CM

## 2019-11-04 DIAGNOSIS — E785 Hyperlipidemia, unspecified: Secondary | ICD-10-CM | POA: Diagnosis present

## 2019-11-04 DIAGNOSIS — Z7901 Long term (current) use of anticoagulants: Secondary | ICD-10-CM

## 2019-11-04 DIAGNOSIS — Z90710 Acquired absence of both cervix and uterus: Secondary | ICD-10-CM

## 2019-11-04 DIAGNOSIS — Z87891 Personal history of nicotine dependence: Secondary | ICD-10-CM

## 2019-11-04 DIAGNOSIS — I10 Essential (primary) hypertension: Secondary | ICD-10-CM | POA: Diagnosis not present

## 2019-11-04 DIAGNOSIS — I482 Chronic atrial fibrillation, unspecified: Secondary | ICD-10-CM

## 2019-11-04 DIAGNOSIS — I5033 Acute on chronic diastolic (congestive) heart failure: Secondary | ICD-10-CM | POA: Diagnosis present

## 2019-11-04 DIAGNOSIS — Z79899 Other long term (current) drug therapy: Secondary | ICD-10-CM

## 2019-11-04 DIAGNOSIS — J441 Chronic obstructive pulmonary disease with (acute) exacerbation: Secondary | ICD-10-CM | POA: Diagnosis not present

## 2019-11-04 DIAGNOSIS — I480 Paroxysmal atrial fibrillation: Secondary | ICD-10-CM | POA: Diagnosis present

## 2019-11-04 DIAGNOSIS — I130 Hypertensive heart and chronic kidney disease with heart failure and stage 1 through stage 4 chronic kidney disease, or unspecified chronic kidney disease: Secondary | ICD-10-CM | POA: Diagnosis present

## 2019-11-04 DIAGNOSIS — R531 Weakness: Secondary | ICD-10-CM

## 2019-11-04 DIAGNOSIS — I248 Other forms of acute ischemic heart disease: Secondary | ICD-10-CM | POA: Diagnosis present

## 2019-11-04 DIAGNOSIS — Z7952 Long term (current) use of systemic steroids: Secondary | ICD-10-CM

## 2019-11-04 DIAGNOSIS — I4821 Permanent atrial fibrillation: Secondary | ICD-10-CM | POA: Diagnosis present

## 2019-11-04 DIAGNOSIS — N1831 Chronic kidney disease, stage 3a: Secondary | ICD-10-CM | POA: Diagnosis present

## 2019-11-04 DIAGNOSIS — E1122 Type 2 diabetes mellitus with diabetic chronic kidney disease: Secondary | ICD-10-CM | POA: Diagnosis present

## 2019-11-04 DIAGNOSIS — N179 Acute kidney failure, unspecified: Secondary | ICD-10-CM | POA: Diagnosis present

## 2019-11-04 DIAGNOSIS — Z7951 Long term (current) use of inhaled steroids: Secondary | ICD-10-CM

## 2019-11-04 DIAGNOSIS — Z79891 Long term (current) use of opiate analgesic: Secondary | ICD-10-CM

## 2019-11-04 DIAGNOSIS — Z20822 Contact with and (suspected) exposure to covid-19: Secondary | ICD-10-CM | POA: Diagnosis present

## 2019-11-04 DIAGNOSIS — I209 Angina pectoris, unspecified: Secondary | ICD-10-CM | POA: Diagnosis present

## 2019-11-04 DIAGNOSIS — I25119 Atherosclerotic heart disease of native coronary artery with unspecified angina pectoris: Secondary | ICD-10-CM | POA: Diagnosis present

## 2019-11-04 DIAGNOSIS — Z833 Family history of diabetes mellitus: Secondary | ICD-10-CM

## 2019-11-04 LAB — BASIC METABOLIC PANEL
Anion gap: 8 (ref 5–15)
BUN: 27 mg/dL — ABNORMAL HIGH (ref 8–23)
CO2: 27 mmol/L (ref 22–32)
Calcium: 9.3 mg/dL (ref 8.9–10.3)
Chloride: 102 mmol/L (ref 98–111)
Creatinine, Ser: 1.8 mg/dL — ABNORMAL HIGH (ref 0.44–1.00)
GFR calc Af Amer: 30 mL/min — ABNORMAL LOW (ref >60)
GFR calc non Af Amer: 26 mL/min — ABNORMAL LOW (ref >60)
Glucose, Bld: 125 mg/dL — ABNORMAL HIGH (ref 70–99)
Potassium: 4.4 mmol/L (ref 3.5–5.1)
Sodium: 137 mmol/L (ref 135–145)

## 2019-11-04 LAB — RESPIRATORY PANEL BY RT PCR (FLU A&B, COVID)
Influenza A by PCR: NEGATIVE
Influenza B by PCR: NEGATIVE
SARS Coronavirus 2 by RT PCR: NEGATIVE

## 2019-11-04 LAB — CBC
HCT: 32 % — ABNORMAL LOW (ref 36.0–46.0)
Hemoglobin: 9.9 g/dL — ABNORMAL LOW (ref 12.0–15.0)
MCH: 28 pg (ref 26.0–34.0)
MCHC: 30.9 g/dL (ref 30.0–36.0)
MCV: 90.4 fL (ref 80.0–100.0)
Platelets: 196 10*3/uL (ref 150–400)
RBC: 3.54 MIL/uL — ABNORMAL LOW (ref 3.87–5.11)
RDW: 13.6 % (ref 11.5–15.5)
WBC: 9 10*3/uL (ref 4.0–10.5)
nRBC: 0 % (ref 0.0–0.2)

## 2019-11-04 LAB — GLUCOSE, CAPILLARY: Glucose-Capillary: 184 mg/dL — ABNORMAL HIGH (ref 70–99)

## 2019-11-04 LAB — BRAIN NATRIURETIC PEPTIDE: B Natriuretic Peptide: 1108 pg/mL — ABNORMAL HIGH (ref 0.0–100.0)

## 2019-11-04 LAB — TROPONIN I (HIGH SENSITIVITY)
Troponin I (High Sensitivity): 24 ng/L — ABNORMAL HIGH (ref <18)
Troponin I (High Sensitivity): 26 ng/L — ABNORMAL HIGH (ref <18)

## 2019-11-04 LAB — PROTIME-INR
INR: 1.9 — ABNORMAL HIGH (ref 0.8–1.2)
Prothrombin Time: 22 seconds — ABNORMAL HIGH (ref 11.4–15.2)

## 2019-11-04 MED ORDER — ONDANSETRON HCL 4 MG PO TABS: 4.0000 mg | ORAL_TABLET | Freq: Four times a day (QID) | ORAL | Status: DC | PRN

## 2019-11-04 MED ORDER — FUROSEMIDE 40 MG PO TABS
40.0000 mg | ORAL_TABLET | Freq: Every day | ORAL | Status: DC
  Administered 2019-11-05: 40 mg via ORAL
  Filled 2019-11-04: qty 1

## 2019-11-04 MED ORDER — METHYLPREDNISOLONE SODIUM SUCC 40 MG IJ SOLR
40.0000 mg | Freq: Two times a day (BID) | INTRAMUSCULAR | Status: DC
  Administered 2019-11-04 – 2019-11-07 (×6): 40 mg via INTRAVENOUS
  Filled 2019-11-04 (×6): qty 1

## 2019-11-04 MED ORDER — AMIODARONE HCL 200 MG PO TABS
200.0000 mg | ORAL_TABLET | Freq: Every day | ORAL | Status: DC
  Administered 2019-11-05 – 2019-11-07 (×3): 200 mg via ORAL
  Filled 2019-11-04 (×3): qty 1

## 2019-11-04 MED ORDER — WARFARIN - PHARMACIST DOSING INPATIENT
Freq: Every day | Status: DC
  Filled 2019-11-04: qty 1

## 2019-11-04 MED ORDER — ONDANSETRON HCL 4 MG/2ML IJ SOLN: 4.0000 mg | Freq: Four times a day (QID) | INTRAMUSCULAR | Status: DC | PRN

## 2019-11-04 MED ORDER — IPRATROPIUM-ALBUTEROL 0.5-2.5 (3) MG/3ML IN SOLN
3.0000 mL | Freq: Once | RESPIRATORY_TRACT | Status: AC
  Administered 2019-11-04: 15:00:00 3 mL via RESPIRATORY_TRACT
  Filled 2019-11-04: qty 3

## 2019-11-04 MED ORDER — METHYLPREDNISOLONE SODIUM SUCC 125 MG IJ SOLR: 80.0000 mg | Freq: Once | INTRAMUSCULAR | Status: DC

## 2019-11-04 MED ORDER — WARFARIN SODIUM 2 MG PO TABS
2.0000 mg | ORAL_TABLET | Freq: Once | ORAL | Status: AC
  Administered 2019-11-04: 2 mg via ORAL
  Filled 2019-11-04: qty 1

## 2019-11-04 MED ORDER — BUDESONIDE 0.25 MG/2ML IN SUSP
0.2500 mg | Freq: Two times a day (BID) | RESPIRATORY_TRACT | Status: DC
  Administered 2019-11-04 – 2019-11-07 (×5): 0.25 mg via RESPIRATORY_TRACT
  Filled 2019-11-04 (×5): qty 2

## 2019-11-04 MED ORDER — ACETAMINOPHEN 650 MG RE SUPP: 650.0000 mg | Freq: Four times a day (QID) | RECTAL | Status: DC | PRN

## 2019-11-04 MED ORDER — FUROSEMIDE 10 MG/ML IJ SOLN
40.0000 mg | Freq: Once | INTRAMUSCULAR | Status: AC
  Administered 2019-11-04: 40 mg via INTRAVENOUS
  Filled 2019-11-04: qty 4

## 2019-11-04 MED ORDER — ACETAMINOPHEN 325 MG PO TABS: 650.0000 mg | ORAL_TABLET | Freq: Four times a day (QID) | ORAL | Status: DC | PRN

## 2019-11-04 MED ORDER — ALBUTEROL SULFATE (2.5 MG/3ML) 0.083% IN NEBU
2.5000 mg | INHALATION_SOLUTION | Freq: Once | RESPIRATORY_TRACT | Status: AC
  Administered 2019-11-04: 20:00:00 2.5 mg via RESPIRATORY_TRACT
  Filled 2019-11-04: qty 3

## 2019-11-04 MED ORDER — LEVALBUTEROL HCL 1.25 MG/0.5ML IN NEBU
1.2500 mg | INHALATION_SOLUTION | Freq: Four times a day (QID) | RESPIRATORY_TRACT | Status: DC
  Administered 2019-11-04 – 2019-11-07 (×11): 1.25 mg via RESPIRATORY_TRACT
  Filled 2019-11-04 (×11): qty 0.5

## 2019-11-04 MED ORDER — IPRATROPIUM BROMIDE 0.02 % IN SOLN
0.5000 mg | Freq: Four times a day (QID) | RESPIRATORY_TRACT | Status: DC
  Administered 2019-11-04 – 2019-11-07 (×11): 0.5 mg via RESPIRATORY_TRACT
  Filled 2019-11-04 (×11): qty 2.5

## 2019-11-04 MED ORDER — TRAMADOL HCL 50 MG PO TABS
50.0000 mg | ORAL_TABLET | ORAL | Status: DC | PRN
  Administered 2019-11-06 – 2019-11-07 (×2): 50 mg via ORAL
  Filled 2019-11-04: qty 1

## 2019-11-04 NOTE — H&P (Signed)
History and Physical    Ashley Bird LXB:[id number] DOB: 1936/06/17 DOA: 11/04/2019  I have briefly reviewed the patient's prior medical records in [place]  PCP: Maryland Pink, MD  Patient coming from: home  Chief Complaint: shortness of breath   HPI: Ashley Bird is a 84 y.o. female with medical history significant of COPD, coronary artery disease, permanent A. fib on chronic anticoagulation with Coumadin, hypertension, hyperlipidemia, chronic kidney disease stage 4 with baseline creatinine 1.5-1.9, presents to the hospital with chief complaint of shortness of breath.  Patient has been having progressive shortness of breath over the last couple of days, noticing that it was more more difficult to ambulate.  She also has noted intermittent palpitations along with chest pain.  She took her heart rate at home and it was in the 1 teens.  She denies any fever or chills, she denies any cough or chest congestion.  No abdominal pain, no nausea or vomiting.  She also has noted intermittent wheezing at home.  She has been vaccinated for Covid, received both shots.  Denies any sore throat.  On my evaluation she is chest pain-free.  She has not noticed any swelling in her ankles or weight gain.  ED Course: In the emergency room she is afebrile, normotensive, heart rate in the 80s-90s.  She is satting in the mid 90s on room air however on ambulation she drops into the 80s.  Her blood work reveals creatinine of 1.8, BNP elevated at 07/12/2006, high-sensitivity troponin 24 >> 26.  INR is 1.9.  She has received IV Lasix as well as IV steroids and nebulizers in the ED, feeling better however still gets pretty short of breath with walking.  Chest x-ray with bibasilar pleural effusions.  We are asked to admit. Covid 19 pending  Review of Systems: All systems reviewed, and apart from HPI, all negative  Past Medical History:  Diagnosis Date  . CAD (coronary artery disease)   . CHF (congestive heart  failure) ([place])   . COPD (chronic obstructive pulmonary disease) ([place])   . Diabetes ([place])   . HLD (hyperlipidemia)   . HTN (hypertension)   . PAF (paroxysmal atrial fibrillation) ([place])     Past Surgical History:  Procedure Laterality Date  . ABDOMINAL HYSTERECTOMY    . BREAST EXCISIONAL BIOPSY Left 40 yrs ago   neg  . BREAST EXCISIONAL BIOPSY Left 40 yrs ago   neg     reports that she quit smoking about 40 years ago. Her smoking use included cigarettes. She has a 60.00 pack-year smoking history. She has never used smokeless tobacco. She reports previous alcohol use. She reports that she does not use drugs.  Allergies  Allergen Reactions  . Alendronate Other (See Comments)    Reaction: unknown  . Doxycycline Swelling    Swelling and hemmorage     . Effexor [Venlafaxine] Other (See Comments)    Body ached   . Ferralet [Iron-Folic [id number]] Nausea And Vomiting    Vomiting   . Ferrous Sulfate Nausea And Vomiting  . Penicillins Swelling    Per pt: swelling and hemmorage     . Prozac [Fluoxetine] Other (See Comments)    Reaction: unknown  . Simvastatin Other (See Comments)    Muscle pain   . Sulfa Antibiotics Other (See Comments)    Reaction: unknown  . Zoloft [Sertraline Hcl] Other (See Comments)    Reaction: unknown    Family History  Problem Relation Age of Onset  .  Colon cancer Mother   . Diabetes Mother   . Asthma Father     Prior to Admission medications   Medication Sig Start Date End Date Taking? Authorizing Provider  acetaminophen (TYLENOL) 325 MG tablet Take 325 mg by mouth every 6 (six) hours as needed. Takes with Tramadol    [provider]  ADVAIR DISKUS 100-50 MCG/DOSE AEPB Inhale 1 puff into the lungs every 12 (twelve) hours. 02/23/18   [provider]  albuterol (ACCUNEB) 1.25 MG/3ML nebulizer solution Inhale 1 ampule into the lungs every 4 (four) hours as needed for wheezing or shortness of breath.  05/11/18 05/11/19   [provider]  albuterol (PROVENTIL HFA;VENTOLIN HFA) 108 (90 Base) MCG/ACT inhaler Inhale 2 puffs into the lungs every 4 (four) hours as needed for wheezing or shortness of breath.  05/11/18 05/11/19  [provider]  amiodarone (PACERONE) 200 MG tablet Take 200 mg by mouth daily. 03/03/18   [provider]  b complex vitamins tablet Take 1 tablet by mouth daily.    [provider]  calcium carbonate (OSCAL) 1500 (600 Ca) MG TABS tablet Take 600 mg of elemental calcium by mouth daily with breakfast.    [provider]  cetirizine (ZYRTEC) 10 MG tablet Take 10 mg by mouth every other day.     [provider]  Cholecalciferol (VITAMIN D3) 5000 units TABS Take 1 tablet by mouth daily.    [provider]  furosemide (LASIX) 20 MG tablet Take 1 tablet (20 mg total) by mouth 2 (two) times daily. 03/24/19 03/23/20  Otila Back, MD  Melatonin 1 MG TABS Take 1 mg by mouth at bedtime as needed (sleep).     [provider]  metoprolol succinate (TOPROL-XL) 50 MG 24 hr tablet Take 50 mg by mouth daily. 05/01/18   [provider]  Multiple Vitamins-Minerals (MULTIVITAMIN GUMMIES WOMENS) CHEW Chew 1 tablet by mouth daily.    [provider]  olmesartan-hydrochlorothiazide (BENICAR HCT) 40-25 MG tablet Take 1 tablet by mouth daily. 01/26/18   [provider]  predniSONE (DELTASONE) 10 MG tablet 40 mg p.o. daily x2 days 1030 mg p.o. daily x2 days Then 20 mg p.o. daily x2 days Then 10 mg p.o. daily x2 days 03/24/19   Otila Back, MD  traMADol (ULTRAM) 50 MG tablet Take 50-100 mg by mouth every 4 (four) hours as needed for moderate pain. Takes with Warfarin 04/03/18   [provider]  warfarin (COUMADIN) 2 MG tablet Take 2 mg by mouth daily.  03/02/18   [provider]    Physical Exam: Vitals:   11/04/19 1630 11/04/19 1700 11/04/19 1730 11/04/19 1800  BP: 100/86  114/65 127/62  Pulse: 88  67 87    Resp:  20 20   Temp:      SpO2: 98%  (!) 87% 100%  Weight:      Height:        Constitutional: NAD, calm, comfortable Eyes: PERRL, lids and conjunctivae normal ENMT: Mucous membranes are moist.  Neck: normal, supple Respiratory: Faint end expiratory wheezing, slightly tachypnea, however overall normal respiratory effort. No accessory muscle use.  Cardiovascular: Irregularly irregular, no edema Abdomen: no tenderness, no masses palpated. Bowel sounds positive.  Musculoskeletal: no clubbing / cyanosis. Normal muscle tone.  Skin: no rashes, lesions, ulcers. No induration Neurologic: CN 2-12 grossly intact. Strength 5/5 in all 4.  Psychiatric: Normal judgment and insight. Alert and oriented x 3. Normal mood.   Labs on Admission:  I have personally reviewed following labs and imaging studies  CBC: Recent Labs  Lab 11/04/19 1119  WBC 9.0  HGB 9.9*  HCT 32.0*  MCV 90.4  PLT 915   Basic Metabolic Panel: Recent Labs  Lab 11/04/19 1119  NA 137  K 4.4  CL 102  CO2 27  GLUCOSE 125*  BUN 27*  CREATININE 1.80*  CALCIUM 9.3   Liver Function Tests: No results for input(s): AST, ALT, ALKPHOS, BILITOT, PROT, ALBUMIN in the last 168 hours. Coagulation Profile: Recent Labs  Lab 11/04/19 1256  INR 1.9*   BNP (last 3 results) No results for input(s): PROBNP in the last 8760 hours. CBG: No results for input(s): GLUCAP in the last 168 hours. Thyroid Function Tests: No results for input(s): TSH, T4TOTAL, FREET4, T3FREE, THYROIDAB in the last 72 hours. Urine analysis:    Component Value Date/Time   COLORURINE STRAW (A) 05/01/2018 2329   APPEARANCEUR HAZY (A) 05/01/2018 2329   LABSPEC 1.006 05/01/2018 2329   PHURINE 5.0 05/01/2018 2329   GLUCOSEU NEGATIVE 05/01/2018 2329   HGBUR SMALL (A) 05/01/2018 2329   BILIRUBINUR NEGATIVE 05/01/2018 2329   KETONESUR NEGATIVE 05/01/2018 2329   PROTEINUR NEGATIVE 05/01/2018 2329   NITRITE NEGATIVE 05/01/2018 2329   LEUKOCYTESUR  NEGATIVE 05/01/2018 2329     Radiological Exams on Admission: DG Chest 2 View  Result Date: 11/04/2019 CLINICAL DATA:  Chest pain. EXAM: CHEST - 2 VIEW COMPARISON:  03/22/2019 FINDINGS: The heart is borderline enlarged. Moderate tortuosity and calcification of the thoracic aorta. There are small bilateral pleural effusions and overlying bibasilar atelectasis. No edema or infiltrates or pneumothorax. The bony thorax is intact. IMPRESSION: Small bilateral pleural effusions and overlying bibasilar atelectasis. Electronically Signed   By: Marijo Sanes M.D.   On: 11/04/2019 12:31    EKG: Independently reviewed.  Atrial fibrillation  Assessment/Plan  Principal Problem Acute hypoxic respiratory failure likely due to COPD exacerbation, acute on chronic diastolic CHF -Patient was given IV Lasix, IV steroids as well as nebulizer in the ED and already feels a little bit better.  She is not overtly fluid overloaded, hold further IV diuresis and monitor renal function in the morning.  Resume oral Lasix -Continue IV steroids, nebulizers, she has not had any productive cough or infectious symptoms and hold adding antibiotics for now -Most recent 2D echo in 2019 showed normal EF -Strict ins/outs, daily weights  Active Problems Permanent atrial fibrillation -Currently appears rate controlled, heart rate goes up with ambulation which is likely due to COPD exacerbation -Continue amiodarone, continue home Coumadin per pharmacy  Essential hypertension -Hold Benicar, monitor blood pressure  Chronic kidney disease stage IV -Creatinine 1.8 on admission, at baseline  Elevated troponin -Overall flat, not in a pattern consistent with ACS, likely demand ischemia  Weakness -Likely due to #1, PT evaluation  DVT prophylaxis: On Coumadin Code Status: DNR per patient Family Communication: No family at bedside Disposition Plan: Likely home when ready Bed Type: MedSurg with cardiac monitoring Consults  called: None Obs/Inp: Observation  At the time of admission, it appears that the appropriate admission status for this patient is INPATIENT as it is expected that patient will require hospital care > 2 midnights. This is judged to be reasonable and necessary in order to provide the required intensity of service to ensure the patient's safety given: presenting symptoms, initial radiographic and laboratory data and in the context of their chronic comorbidities. Together, these circumstances are felt to place patient at high at high risk  for further clinical deterioration threatening life, limb, or organ.  Marzetta Board, MD, PhD [facility]  Contact via [email or web address]  11/04/2019, 6:33 PM

## 2019-11-04 NOTE — ED Triage Notes (Signed)
Pt arrived via EMS for report of inability to stand and walk this am, SHOB, and chest pain  - Pt reports HR of 90-120

## 2019-11-04 NOTE — Consult Note (Signed)
ANTICOAGULATION CONSULT NOTE - Initial Consult  Pharmacy Consult for Warfarin Dosing Indication: atrial fibrillation  Allergies  Allergen Reactions  . Alendronate Other (See Comments)    Reaction: unknown  . Doxycycline Swelling    Swelling and hemmorage     . Effexor [Venlafaxine] Other (See Comments)    Body ached   . Ferralet [Iron-Folic [id number]] Nausea And Vomiting    Vomiting   . Ferrous Sulfate Nausea And Vomiting  . Penicillins Swelling    Per pt: swelling and hemmorage     . Prozac [Fluoxetine] Other (See Comments)    Reaction: unknown  . Simvastatin Other (See Comments)    Muscle pain   . Sulfa Antibiotics Other (See Comments)    Reaction: unknown  . Zoloft [Sertraline Hcl] Other (See Comments)    Reaction: unknown    Patient Measurements: Height: 5\' 3"  (160 cm) Weight: 73.5 kg (162 lb) IBW/kg (Calculated) : 52.4 Heparin Dosing Weight: 67.9 kg  Vital Signs: Temp: 98 F (36.7 C) (04/25 1113) BP: 103/62 (04/25 1830) Pulse Rate: 88 (04/25 1830)  Labs: Recent Labs    11/04/19 1119 11/04/19 1256  HGB 9.9*  --   HCT 32.0*  --   PLT 196  --   LABPROT  --  22.0*  INR  --  1.9*  CREATININE 1.80*  --   TROPONINIHS 24* 26*    Estimated Creatinine Clearance: 23.5 mL/min (A) (by C-G formula based on SCr of 1.8 mg/dL (H)).   Medical History: Past Medical History:  Diagnosis Date  . CAD (coronary artery disease)   . CHF (congestive heart failure) ([place])   . COPD (chronic obstructive pulmonary disease) ([facility])   . Diabetes ([place])   . HLD (hyperlipidemia)   . HTN (hypertension)   . PAF (paroxysmal atrial fibrillation) ([facility])     Medications:  (Not in a hospital admission)  Scheduled:  . albuterol  2.5 mg Nebulization Once  . budesonide (PULMICORT) nebulizer solution  0.25 mg Nebulization BID  . ipratropium  0.5 mg Nebulization Q6H  . levalbuterol  1.25 mg Nebulization Q6H  . methylPREDNISolone (SOLU-MEDROL) injection  40 mg Intravenous  Q12H  . warfarin  2 mg Oral Once  . [START ON 11/05/2019] Warfarin - Pharmacist Dosing Inpatient   Does not apply q1600   Infusions:   PRN: acetaminophen **OR** acetaminophen, ondansetron **OR** ondansetron (ZOFRAN) IV Anti-infectives (From admission, onward)   None      Assessment: Pharmacy has been consulted for Warfarin dosing in 83yo patient with history of atrial fibrillation. Patient also take Amiodarone PTA that will be continued in-patient. Last dose taken was yesterday evening(4/24)  Home regimen: 1.5mg  on Wednesdays and Saturdays. 2mg  on all other days.   Baseline INR on 4/25: 1.9  Goal of Therapy:  INR 2-3 Monitor platelets by anticoagulation protocol: Yes   Plan:  INR 1.9. Slightly subtherapeutic. Will order Warfarin 2mg  PO x 1 dose to be given this evening.  Will order daily INRs and continue to monitor patient's H&H.  Terrel Nesheiwat A Kregg Cihlar 11/04/2019,6:59 PM

## 2019-11-04 NOTE — ED Notes (Signed)
This EDT and [facility] EDT ambulated this pt per NP request, pt o2 saturation dropped down to 88% and heart rate went up to 120bpm. NP notified

## 2019-11-04 NOTE — Plan of Care (Signed)

## 2019-11-04 NOTE — ED Notes (Signed)
Pt to ED bed 15 via WC, c/o chest pain, sob and weakness since yesterday.  Chest pain started when she was changing cat litter.  Currently pt presents in no acute distress, GCS 15, RR even/unlabored, clear bilaterally.  +PMSC x4, abd soft/nontender, skin warm/pink/dry with cap refill less than 2 secs x4.  VSS pt currently denies chest pain and denies sob.  Weakness still remains rpesent.  Will continue to monitor/reassess.

## 2019-11-04 NOTE — ED Provider Notes (Signed)
[facility] Emergency Department Provider Note  ____________________________________________   First MD Initiated Contact with Patient 11/04/19 1256     (approximate)  I have reviewed the triage vital signs and the nursing notes.   HISTORY  Chief Complaint Chest Pain and Shortness of Breath   HPI Ashley Bird is a 84 y.o. female with a history of CAD, CHF, COPD, diabetes, hypertension, hyperlipidemia, and paroxysmal atrial fibrillation who presents to the emergency department for treatment and evaluation of chest pain, shortness of breath, and weakness since yesterday.  Because she was so short of breath yesterday and today she has had difficulty ambulating.  She has also noticed some wheezing which is unusual for.  She has also experienced some palpitations and noticed that her heart rate was as high as 114.  During that time, she experienced tightness in her chest.  Currently she denies chest tightness or shortness of breath.  She does still feel generalized weakness.  She denies any medication changes, nausea, vomiting, loss of appetite, cough, fever, or other symptoms of concern.   Past Medical History:  Diagnosis Date  . CAD (coronary artery disease)   . CHF (congestive heart failure) ([place])   . COPD (chronic obstructive pulmonary disease) ([place])   . Diabetes ([place])   . HLD (hyperlipidemia)   . HTN (hypertension)   . PAF (paroxysmal atrial fibrillation) [facility])     Patient Active Problem List   Diagnosis Date Noted  . COPD exacerbation ([place]) 03/22/2019  . Palliative care encounter 11/28/2018  . Shortness of breath 11/28/2018  . Chronic diastolic heart failure ([place]) 05/22/2018  . Acute systolic CHF (congestive heart failure) ([place]) 05/01/2018  . CAP (community acquired pneumonia) 05/01/2018  . COPD with acute exacerbation ([place]) 05/01/2018  . Diabetes ([place]) 05/01/2018  . HTN (hypertension) 05/01/2018  . HLD (hyperlipidemia) 05/01/2018  . PAF  (paroxysmal atrial fibrillation) ([employer]) 05/01/2018    Past Surgical History:  Procedure Laterality Date  . ABDOMINAL HYSTERECTOMY    . BREAST EXCISIONAL BIOPSY Left 40 yrs ago   neg  . BREAST EXCISIONAL BIOPSY Left 40 yrs ago   neg    Prior to Admission medications   Medication Sig Start Date End Date Taking? Authorizing Provider  acetaminophen (TYLENOL) 325 MG tablet Take 325 mg by mouth every 6 (six) hours as needed. Takes with Tramadol   Yes [provider]  albuterol (ACCUNEB) 1.25 MG/3ML nebulizer solution Inhale 1 ampule into the lungs every 4 (four) hours as needed for wheezing or shortness of breath.  05/11/18 11/04/19 Yes [provider]  albuterol (PROVENTIL HFA;VENTOLIN HFA) 108 (90 Base) MCG/ACT inhaler Inhale 2 puffs into the lungs every 4 (four) hours as needed for wheezing or shortness of breath.  05/11/18 11/04/19 Yes [provider]  amiodarone (PACERONE) 200 MG tablet Take 200 mg by mouth daily. 03/03/18  Yes [provider]  b complex vitamins tablet Take 1 tablet by mouth daily.   Yes [provider]  calcium carbonate (OSCAL) 1500 (600 Ca) MG TABS tablet Take 600 mg of elemental calcium by mouth daily with breakfast.   Yes [provider]  cetirizine (ZYRTEC) 10 MG tablet Take 10 mg by mouth every other day.    Yes [provider]  Cholecalciferol (VITAMIN D3) 5000 units TABS Take 1 tablet by mouth daily.   Yes [provider]  furosemide (LASIX) 20 MG tablet Take 1 tablet (20 mg total) by mouth 2 (two) times daily. Patient taking  differently: Take 20 mg by mouth 2 (two) times daily as needed. Only takes if gained 2 pounds 03/24/19 03/23/20 Yes Ojie, Jude, MD  Melatonin 1 MG TABS Take 1 mg by mouth at bedtime as needed (sleep).    Yes [provider]  Multiple Vitamins-Minerals (MULTIVITAMIN GUMMIES WOMENS) CHEW Chew 1 tablet by mouth daily.   Yes [provider]    olmesartan-hydrochlorothiazide (BENICAR HCT) 40-25 MG tablet Take 1 tablet by mouth daily. 01/26/18  Yes [provider]  traMADol (ULTRAM) 50 MG tablet Take 50-100 mg by mouth every 4 (four) hours as needed for moderate pain. Takes with Warfarin 04/03/18  Yes [provider]  warfarin (COUMADIN) 2 MG tablet Take 1.5-2 mg by mouth daily. 2 mg on Wednesday, Saturday, 1.5 mg all other days 03/02/18  Yes [provider]    Allergies Alendronate, Doxycycline, Effexor [venlafaxine], Ferralet [iron-folic [id number]], Ferrous sulfate, Penicillins, Prozac [fluoxetine], Simvastatin, Sulfa antibiotics, and Zoloft [sertraline hcl]  Family History  Problem Relation Age of Onset  . Colon cancer Mother   . Diabetes Mother   . Asthma Father     Social History Social History   Tobacco Use  . Smoking status: Former Smoker    Packs/day: 2.00    Years: 30.00    Pack years: 60.00    Types: Cigarettes    Quit date: 05/1979    Years since quitting: 40.5  . Smokeless tobacco: Never Used  Substance Use Topics  . Alcohol use: Not Currently  . Drug use: Never    Review of Systems  Constitutional: No fever/chills. Eyes: No visual changes. ENT: No sore throat. Cardiovascular: Positive for chest pressure.  Negative for pleuritic pain.  Positive for palpitations.  Negative for leg pain. Respiratory: Positive shortness of breath. Gastrointestinal: Negative for abdominal pain.  No nausea, no vomiting.  No diarrhea.  No constipation. Genitourinary: Negative for dysuria. Musculoskeletal: Negative for back pain.  Skin: Negative for rash, lesion, wound. Neurological: Negative for headaches, focal weakness or numbness. ____________________________________________   PHYSICAL EXAM:  VITAL SIGNS: ED Triage Vitals  Enc Vitals Group     BP 11/04/19 1113 116/70     Pulse Rate 11/04/19 1113 87     Resp 11/04/19 1113 16     Temp 11/04/19 1113 98 F (36.7 C)     Temp src  --      SpO2 11/04/19 1113 95 %     Weight 11/04/19 1116 162 lb (73.5 kg)     Height 11/04/19 1116 5\' 3"  (1.6 m)     Head Circumference --      Peak Flow --      Pain Score 11/04/19 1116 0     Pain Loc --      Pain Edu? --      Excl. in [place]? --     Constitutional: Alert and oriented.  Overall well appearing and in no acute distress.  Normal mental status. Eyes: Conjunctivae are normal. PERRL. Head: Atraumatic. Nose: No congestion/rhinnorhea. Mouth/Throat: Mucous membranes are moist.  Oropharynx non-erythematous. Tongue normal in size and color. Neck: No stridor.  No carotid bruit appreciated on exam. Hematological/Lymphatic/Immunilogical: No cervical lymphadenopathy. Cardiovascular: Normal rate, regular rhythm. Grossly normal heart sounds.  Good peripheral circulation. Respiratory: Normal respiratory effort.  No retractions. Lungs CTAB. Gastrointestinal: Soft and nontender. No distention. No abdominal bruits. No CVA tenderness. Genitourinary: Exam deferred. Musculoskeletal: No lower extremity tenderness.  No edema of extremities. Neurologic:  Normal speech and language. No gross focal  neurologic deficits are appreciated. Skin:  Skin is warm, dry and intact. No rash noted. Psychiatric: Mood and affect are normal. Speech and behavior are normal.  ____________________________________________   LABS (all labs ordered are listed, but only abnormal results are displayed)  Labs Reviewed  BASIC METABOLIC PANEL - Abnormal; Notable for the following components:      Result Value   Glucose, Bld 125 (*)    BUN 27 (*)    Creatinine, Ser 1.80 (*)    GFR calc non Af Amer 26 (*)    GFR calc Af Amer 30 (*)    All other components within normal limits  CBC - Abnormal; Notable for the following components:   RBC 3.54 (*)    Hemoglobin 9.9 (*)    HCT 32.0 (*)    All other components within normal limits  PROTIME-INR - Abnormal; Notable for the following components:   Prothrombin Time 22.0  (*)    INR 1.9 (*)    All other components within normal limits  BRAIN NATRIURETIC PEPTIDE - Abnormal; Notable for the following components:   B Natriuretic Peptide 1,108.0 (*)    All other components within normal limits  TROPONIN I (HIGH SENSITIVITY) - Abnormal; Notable for the following components:   Troponin I (High Sensitivity) 24 (*)    All other components within normal limits  TROPONIN I (HIGH SENSITIVITY) - Abnormal; Notable for the following components:   Troponin I (High Sensitivity) 26 (*)    All other components within normal limits  RESPIRATORY PANEL BY RT PCR (FLU A&B, COVID)  PROTIME-INR   ____________________________________________  EKG  ED ECG REPORT I, Bladen Umar, FNP-BC personally viewed and interpreted this ECG.   Date: 11/04/2019  EKG Time: 11:05 AM  Rate: 91  Rhythm: normal EKG, normal sinus rhythm, unchanged from previous tracings, atrial fibrillation, rate 91  Axis: Leftward  Intervals:left bundle branch block  ST&T Change: No ST elevation  ____________________________________________  RADIOLOGY  ED MD interpretation: Bibasilar atelectasis on chest x-ray  I, Corina Stacy, personally viewed and evaluated these images (plain radiographs) as part of my medical decision making, as well as reviewing the written report by the radiologist.  Official radiology report(s): DG Chest 2 View  Result Date: 11/04/2019 CLINICAL DATA:  Chest pain. EXAM: CHEST - 2 VIEW COMPARISON:  03/22/2019 FINDINGS: The heart is borderline enlarged. Moderate tortuosity and calcification of the thoracic aorta. There are small bilateral pleural effusions and overlying bibasilar atelectasis. No edema or infiltrates or pneumothorax. The bony thorax is intact. IMPRESSION: Small bilateral pleural effusions and overlying bibasilar atelectasis. Electronically Signed   By: Marijo Sanes M.D.   On: 11/04/2019 12:31     ____________________________________________   PROCEDURES  Procedure(s) performed: None  Procedures  Critical Care performed: Yes, see critical care note(s)  ____________________________________________   INITIAL IMPRESSION / ASSESSMENT AND PLAN / ED COURSE  As part of my medical decision making, I reviewed the following data within the electronic MEDICAL RECORD NUMBER History obtained from family, Old EKG reviewed, Old chart reviewed and Notes from prior ED visits  84 year old female presenting to the emergency department with her daughter for evaluation of shortness of breath that seems to have been worse over the past couple of days.  She has experienced some chest pain and palpitations.  Shortness of breath seems to be preventing her from performing her ADLs.  Currently, she denies any shortness of breath or chest pain.  Plan will be to do a cardiac work-up and  determine disposition after serial troponins.  Exam is overall reassuring. ____________________________________________  Differential diagnosis includes, but not limited to:  Cardiac event, pneumonia, CHF exacerbation,    FINAL CLINICAL IMPRESSION(S) / ED DIAGNOSES  Patient to be admitted after ambulatory sats decreased between 80 and 84% and heart rate increased to 120.  Patient and family are aware and agrees with the plan.  Final diagnoses:  Shortness of breath     ED Discharge Orders    None       Ashley Bird was evaluated in Emergency Department on 11/04/2019 for the symptoms described in the history of present illness. She was evaluated in the context of the global COVID-19 pandemic, which necessitated consideration that the patient might be at risk for infection with the SARS-CoV-2 virus that causes COVID-19. Institutional protocols and algorithms that pertain to the evaluation of patients at risk for COVID-19 are in a state of rapid change based on information released by regulatory bodies including the  CDC and federal and state organizations. These policies and algorithms were followed during the patient's care in the ED.   Note:  This document was prepared using Dragon voice recognition software and may include unintentional dictation errors.   Victorino Dike, FNP 11/04/19 2054    Arta Silence, MD 11/05/19 1555

## 2019-11-05 ENCOUNTER — Observation Stay: Payer: Medicare Other

## 2019-11-05 DIAGNOSIS — R531 Weakness: Secondary | ICD-10-CM

## 2019-11-05 DIAGNOSIS — I5033 Acute on chronic diastolic (congestive) heart failure: Secondary | ICD-10-CM | POA: Diagnosis not present

## 2019-11-05 DIAGNOSIS — J441 Chronic obstructive pulmonary disease with (acute) exacerbation: Secondary | ICD-10-CM | POA: Diagnosis not present

## 2019-11-05 LAB — COMPREHENSIVE METABOLIC PANEL WITH GFR
ALT: 14 U/L (ref 0–44)
AST: 17 U/L (ref 15–41)
Albumin: 3.1 g/dL — ABNORMAL LOW (ref 3.5–5.0)
Alkaline Phosphatase: 60 U/L (ref 38–126)
Anion gap: 9 (ref 5–15)
BUN: 37 mg/dL — ABNORMAL HIGH (ref 8–23)
CO2: 27 mmol/L (ref 22–32)
Calcium: 9.3 mg/dL (ref 8.9–10.3)
Chloride: 103 mmol/L (ref 98–111)
Creatinine, Ser: 1.89 mg/dL — ABNORMAL HIGH (ref 0.44–1.00)
GFR calc Af Amer: 28 mL/min — ABNORMAL LOW
GFR calc non Af Amer: 24 mL/min — ABNORMAL LOW
Glucose, Bld: 213 mg/dL — ABNORMAL HIGH (ref 70–99)
Potassium: 4.3 mmol/L (ref 3.5–5.1)
Sodium: 139 mmol/L (ref 135–145)
Total Bilirubin: 0.6 mg/dL (ref 0.3–1.2)
Total Protein: 6.2 g/dL — ABNORMAL LOW (ref 6.5–8.1)

## 2019-11-05 LAB — CBC
HCT: 30.2 % — ABNORMAL LOW (ref 36.0–46.0)
Hemoglobin: 9.1 g/dL — ABNORMAL LOW (ref 12.0–15.0)
MCH: 27.6 pg (ref 26.0–34.0)
MCHC: 30.1 g/dL (ref 30.0–36.0)
MCV: 91.5 fL (ref 80.0–100.0)
Platelets: 181 10*3/uL (ref 150–400)
RBC: 3.3 MIL/uL — ABNORMAL LOW (ref 3.87–5.11)
RDW: 13.8 % (ref 11.5–15.5)
WBC: 4.2 10*3/uL (ref 4.0–10.5)
nRBC: 0 % (ref 0.0–0.2)

## 2019-11-05 LAB — PROTIME-INR
INR: 2 — ABNORMAL HIGH (ref 0.8–1.2)
Prothrombin Time: 22.5 seconds — ABNORMAL HIGH (ref 11.4–15.2)

## 2019-11-05 LAB — MAGNESIUM: Magnesium: 2 mg/dL (ref 1.7–2.4)

## 2019-11-05 MED ORDER — PREDNISONE 20 MG PO TABS: 40.0000 mg | ORAL_TABLET | Freq: Every day | ORAL | 0 refills | Status: AC

## 2019-11-05 MED ORDER — MECLIZINE HCL 25 MG PO TABS
25.0000 mg | ORAL_TABLET | Freq: Two times a day (BID) | ORAL | Status: DC | PRN
  Filled 2019-11-05: qty 1

## 2019-11-05 MED ORDER — RANOLAZINE ER 500 MG PO TB12: 500.0000 mg | ORAL_TABLET | Freq: Two times a day (BID) | ORAL | 0 refills | Status: DC

## 2019-11-05 MED ORDER — FUROSEMIDE 10 MG/ML IJ SOLN
40.0000 mg | Freq: Once | INTRAMUSCULAR | Status: AC
  Administered 2019-11-05: 20 mg via INTRAVENOUS

## 2019-11-05 MED ORDER — WARFARIN SODIUM 2 MG PO TABS
2.0000 mg | ORAL_TABLET | Freq: Once | ORAL | Status: AC
  Administered 2019-11-05: 2 mg via ORAL
  Filled 2019-11-05: qty 1

## 2019-11-05 MED ORDER — MORPHINE SULFATE (PF) 2 MG/ML IV SOLN
1.0000 mg | Freq: Once | INTRAVENOUS | Status: AC
  Administered 2019-11-05: 1 mg via INTRAVENOUS

## 2019-11-05 MED ORDER — MORPHINE SULFATE (PF) 2 MG/ML IV SOLN
1.0000 mg | INTRAVENOUS | Status: DC | PRN
  Filled 2019-11-05: qty 1

## 2019-11-05 MED ORDER — RANOLAZINE ER 500 MG PO TB12
500.0000 mg | ORAL_TABLET | Freq: Two times a day (BID) | ORAL | Status: DC
  Administered 2019-11-05 – 2019-11-06 (×4): 500 mg via ORAL
  Filled 2019-11-05 (×5): qty 1

## 2019-11-05 MED ORDER — FUROSEMIDE 10 MG/ML IJ SOLN
INTRAMUSCULAR | Status: AC
  Filled 2019-11-05: qty 4

## 2019-11-05 NOTE — Progress Notes (Signed)
At 2024 pt complained of chest pain. Her pain was a 2/10. She wanted to go to the bathroom. She stated she had not went since 3 pm. I got pt up to bsc. While sitting on the bsc pt began to have sob. I got her back to bed where she stated her pain was now a 4. Pt o2 level was checked and it was 81. Administered 2L of oxygen. Dr. Sidney Ace was notified. He gave me a verbal order of IV lasix 40 mg and Morphine 1 mg and to keep o2 level above 93. Rapid was activated. Rapid nurse ordered stat chest xray. Was told to hold lasix until results come back. Pt is currently pain free and resting in bed. Pt is afib so heart rate has been elevated. Will continue to monitor.

## 2019-11-05 NOTE — Discharge Summary (Signed)
Physician Discharge Summary  Ashley Bird QAS:[id number] DOB: Apr 16, 1936 DOA: 11/04/2019  PCP: [place] Pink, MD  Admit date: 11/04/2019 Discharge date: 11/05/2019  Admitted From: home Disposition: home w/ home health  Recommendations for Outpatient Follow-up:  1. Follow up with PCP in 1-2 weeks 2. F/u pulmon in 1 week   Home Health: yes Equipment/Devices:  Discharge Condition: stable CODE STATUS: DNR Diet recommendation: Heart Healthy  Brief/Interim Summary: HPI was taken from Dr. Cruzita Lederer: Ashley Bird is a 84 y.o. female with medical history significant of COPD, coronary artery disease, permanent A. fib on chronic anticoagulation with Coumadin, hypertension, hyperlipidemia, chronic kidney disease stage 4 with baseline creatinine 1.5-1.9, presents to the hospital with chief complaint of shortness of breath.  Patient has been having progressive shortness of breath over the last couple of days, noticing that it was more more difficult to ambulate.  She also has noted intermittent palpitations along with chest pain.  She took her heart rate at home and it was in the 1 teens.  She denies any fever or chills, she denies any cough or chest congestion.  No abdominal pain, no nausea or vomiting.  She also has noted intermittent wheezing at home.  She has been vaccinated for Covid, received both shots.  Denies any sore throat.  On my evaluation she is chest pain-free.  She has not noticed any swelling in her ankles or weight gain.  ED Course: In the emergency room she is afebrile, normotensive, heart rate in the 80s-90s.  She is satting in the mid 90s on room air however on ambulation she drops into the 80s.  Her blood work reveals creatinine of 1.8, BNP elevated at 07/12/2006, high-sensitivity troponin 24 >> 26.  INR is 1.9.  She has received IV Lasix as well as IV steroids and nebulizers in the ED, feeling better however still gets pretty short of breath with walking.  Chest x-ray with bibasilar  pleural effusions.  We are asked to admit. Covid 19 pending   Hospital Course from Dr. Lenna Sciara. Jimmye Norman 11/05/19: Pt presented w/ acute hypoxic respiratory failure secondary to COPD exacerbation. Pt was treated w/ IV steroids, bronchodilators, incentive spirometry and supplemental oxygen.  Pt was able to weaned from supplemental oxygen prior to d/c. Furthermore, pt did received lasix while inpatient but pt was not overtly fluid overloaded. Of note, PT saw the pt and recommended home health. Home health was set up by CM prior to d/c.   Discharge Diagnoses:  Active Problems:   COPD exacerbation ([place])  Acute hypoxic respiratory failure: likely due to COPD exacerbation, acute on chronic diastolic CHF. Weaned off of supplemental oxygen. COVID19 & influenza are both neg   COPD exacerbation: continue on  steroids, bronchodilators & mucinex. Encourage incentive spirometry & flutter valve  Likely acute on chronic diastolic CHF exacerbation: continue on lasix. Monitor I/Os. Echo in 2019 shows normal EF  Permanent atrial fibrillation: continue amiodarone, continue home coumadin   Essential hypertension: hold Benicar. Will continue to monitor   CKDIV: Cr is labile. Will continue to monitor   Elevated troponin: likely due demand ischemia. Started on ranexa for angina.   Weakness: PT recs home health   Discharge Instructions  Discharge Instructions    Diet - low sodium heart healthy   Complete by: As directed    Discharge instructions   Complete by: As directed    F/u PCP in 1-2 weeks; F/u pulmon in 1 week   Increase activity slowly   Complete by: As  directed      Allergies as of 11/05/2019      Reactions   Alendronate Other (See Comments)   Reaction: unknown   Doxycycline Swelling   Swelling and hemmorage      Effexor [venlafaxine] Other (See Comments)   Body ached   Ferralet [iron-folic [id number]] Nausea And Vomiting   Vomiting   Ferrous Sulfate Nausea And Vomiting    Penicillins Swelling   Per pt: swelling and hemmorage    Prozac [fluoxetine] Other (See Comments)   Reaction: unknown   Simvastatin Other (See Comments)   Muscle pain   Sulfa Antibiotics Other (See Comments)   Reaction: unknown   Zoloft [sertraline Hcl] Other (See Comments)   Reaction: unknown      Medication List    TAKE these medications   acetaminophen 325 MG tablet Commonly known as: TYLENOL Take 325 mg by mouth every 6 (six) hours as needed. Takes with Tramadol   albuterol 1.25 MG/3ML nebulizer solution Commonly known as: ACCUNEB Inhale 1 ampule into the lungs every 4 (four) hours as needed for wheezing or shortness of breath.   albuterol 108 (90 Base) MCG/ACT inhaler Commonly known as: VENTOLIN HFA Inhale 2 puffs into the lungs every 4 (four) hours as needed for wheezing or shortness of breath.   amiodarone 200 MG tablet Commonly known as: PACERONE Take 200 mg by mouth daily.   b complex vitamins tablet Take 1 tablet by mouth daily.   calcium carbonate 1500 (600 Ca) MG Tabs tablet Commonly known as: OSCAL Take 600 mg of elemental calcium by mouth daily with breakfast.   cetirizine 10 MG tablet Commonly known as: ZYRTEC Take 10 mg by mouth every other day.   furosemide 20 MG tablet Commonly known as: LASIX Take 1 tablet (20 mg total) by mouth 2 (two) times daily. What changed:   when to take this  reasons to take this  additional instructions   melatonin 1 MG Tabs tablet Take 1 mg by mouth at bedtime as needed (sleep).   Multivitamin Gummies Womens [employer] 1 tablet by mouth daily.   olmesartan-hydrochlorothiazide 40-25 MG tablet Commonly known as: BENICAR HCT Take 1 tablet by mouth daily.   predniSONE 20 MG tablet Commonly known as: Deltasone Take 2 tablets (40 mg total) by mouth daily for 5 days.   ranolazine 500 MG 12 hr tablet Commonly known as: RANEXA Take 1 tablet (500 mg total) by mouth 2 (two) times daily.   traMADol 50 MG  tablet Commonly known as: ULTRAM Take 50-100 mg by mouth every 4 (four) hours as needed for moderate pain. Takes with Warfarin   Vitamin D3 125 MCG (5000 UT) Tabs Take 1 tablet by mouth daily.   warfarin 2 MG tablet Commonly known as: COUMADIN Take 1.5-2 mg by mouth daily. 2 mg on Wednesday, Saturday, 1.5 mg all other days       Allergies  Allergen Reactions  . Alendronate Other (See Comments)    Reaction: unknown  . Doxycycline Swelling    Swelling and hemmorage     . Effexor [Venlafaxine] Other (See Comments)    Body ached   . Ferralet [Iron-Folic [id number]] Nausea And Vomiting    Vomiting   . Ferrous Sulfate Nausea And Vomiting  . Penicillins Swelling    Per pt: swelling and hemmorage     . Prozac [Fluoxetine] Other (See Comments)    Reaction: unknown  . Simvastatin Other (See Comments)    Muscle pain   . Sulfa  Antibiotics Other (See Comments)    Reaction: unknown  . Zoloft [Sertraline Hcl] Other (See Comments)    Reaction: unknown    Consultations: none  Procedures/Studies: DG Chest 2 View  Result Date: 11/04/2019 CLINICAL DATA:  Chest pain. EXAM: CHEST - 2 VIEW COMPARISON:  03/22/2019 FINDINGS: The heart is borderline enlarged. Moderate tortuosity and calcification of the thoracic aorta. There are small bilateral pleural effusions and overlying bibasilar atelectasis. No edema or infiltrates or pneumothorax. The bony thorax is intact. IMPRESSION: Small bilateral pleural effusions and overlying bibasilar atelectasis. Electronically Signed   By: Marijo Sanes M.D.   On: 11/04/2019 12:31    (Echo, Carotid, EGD, Colonoscopy, ERCP)    Subjective:   Discharge Exam: Vitals:   11/05/19 0243 11/05/19 0822  BP: 92/66 (!) 114/54  Pulse: 88 82  Resp: 16 18  Temp: 98.3 F (36.8 C) 97.8 F (36.6 C)  SpO2: 97% 95%   Vitals:   11/04/19 2100 11/05/19 0224 11/05/19 0243 11/05/19 0822  BP:   92/66 (!) 114/54  Pulse:   88 82  Resp:   16 18  Temp:    98.3 F (36.8 C) 97.8 F (36.6 C)  TempSrc:   Oral Oral  SpO2:  93% 97% 95%  Weight: 74.2 kg     Height: 5\' 3"  (1.6 m)       General: Pt is alert, awake, not in acute distress Cardiovascular: S1/S2 +, no rubs, no gallops Respiratory: diminished breath sounds b/l. No rales  Abdominal: Soft, NT, ND, bowel sounds + Extremities: no cyanosis    The results of significant diagnostics from this hospitalization (including imaging, microbiology, ancillary and laboratory) are listed below for reference.     Microbiology: Recent Results (from the past 240 hour(s))  Respiratory Panel by RT PCR (Flu A&B, Covid) - Nasopharyngeal Swab     Status: None   Collection Time: 11/04/19  7:54 PM   Specimen: Nasopharyngeal Swab  Result Value Ref Range Status   SARS Coronavirus 2 by RT PCR NEGATIVE NEGATIVE Final    Comment: (NOTE) SARS-CoV-2 target nucleic acids are NOT DETECTED. The SARS-CoV-2 RNA is generally detectable in upper respiratoy specimens during the acute phase of infection. The lowest concentration of SARS-CoV-2 viral copies this assay can detect is 131 copies/mL. A negative result does not preclude SARS-Cov-2 infection and should not be used as the sole basis for treatment or other patient management decisions. A negative result may occur with  improper specimen collection/handling, submission of specimen other than nasopharyngeal swab, presence of viral mutation(s) within the areas targeted by this assay, and inadequate number of viral copies (<131 copies/mL). A negative result must be combined with clinical observations, patient history, and epidemiological information. The expected result is Negative. Fact Sheet for Patients:  PinkCheek.be Fact Sheet for Healthcare Providers:  GravelBags.it This test is not yet ap proved or cleared by the [place] FDA and  has been authorized for detection and/or diagnosis of  SARS-CoV-2 by FDA under an Emergency Use Authorization (EUA). This EUA will remain  in effect (meaning this test can be used) for the duration of the COVID-19 declaration under Section 564(b)(1) of the Act, 21 U.S.C. section 360bbb-3(b)(1), unless the authorization is terminated or revoked sooner.    Influenza A by PCR NEGATIVE NEGATIVE Final   Influenza B by PCR NEGATIVE NEGATIVE Final    Comment: (NOTE) The Xpert Xpress SARS-CoV-2/FLU/RSV assay is intended as an aid in  the diagnosis of influenza from Nasopharyngeal swab specimens  and  should not be used as a sole basis for treatment. Nasal washings and  aspirates are unacceptable for Xpert Xpress SARS-CoV-2/FLU/RSV  testing. Fact Sheet for Patients: PinkCheek.be Fact Sheet for Healthcare Providers: GravelBags.it This test is not yet approved or cleared by the [place] FDA and  has been authorized for detection and/or diagnosis of SARS-CoV-2 by  FDA under an Emergency Use Authorization (EUA). This EUA will remain  in effect (meaning this test can be used) for the duration of the  Covid-19 declaration under Section 564(b)(1) of the Act, 21  U.S.C. section 360bbb-3(b)(1), unless the authorization is  terminated or revoked. Performed at [facility], [address] [place], [place] [address]      Labs: BNP (last 3 results) Recent Labs    11/04/19 1119  BNP 3,295.1*   Basic Metabolic Panel: Recent Labs  Lab 11/04/19 1119 11/05/19 0521  NA 137 139  K 4.4 4.3  CL 102 103  CO2 27 27  GLUCOSE 125* 213*  BUN 27* 37*  CREATININE 1.80* 1.89*  CALCIUM 9.3 9.3  MG  --  2.0   Liver Function Tests: Recent Labs  Lab 11/05/19 0521  AST 17  ALT 14  ALKPHOS 60  BILITOT 0.6  PROT 6.2*  ALBUMIN 3.1*   No results for input(s): LIPASE, AMYLASE in the last 168 hours. No results for input(s): AMMONIA in the last 168 hours. CBC: Recent Labs  Lab  11/04/19 1119 11/05/19 0521  WBC 9.0 4.2  HGB 9.9* 9.1*  HCT 32.0* 30.2*  MCV 90.4 91.5  PLT 196 181   Cardiac Enzymes: No results for input(s): CKTOTAL, CKMB, CKMBINDEX, TROPONINI in the last 168 hours. BNP: Invalid input(s): POCBNP CBG: Recent Labs  Lab 11/04/19 2159  GLUCAP 184*   D-Dimer No results for input(s): DDIMER in the last 72 hours. Hgb A1c No results for input(s): HGBA1C in the last 72 hours. Lipid Profile No results for input(s): CHOL, HDL, LDLCALC, TRIG, CHOLHDL, LDLDIRECT in the last 72 hours. Thyroid function studies No results for input(s): TSH, T4TOTAL, T3FREE, THYROIDAB in the last 72 hours.  Invalid input(s): FREET3 Anemia work up No results for input(s): VITAMINB12, FOLATE, FERRITIN, TIBC, IRON, RETICCTPCT in the last 72 hours. Urinalysis    Component Value Date/Time   COLORURINE STRAW (A) 05/01/2018 2329   APPEARANCEUR HAZY (A) 05/01/2018 2329   LABSPEC 1.006 05/01/2018 2329   PHURINE 5.0 05/01/2018 2329   GLUCOSEU NEGATIVE 05/01/2018 2329   HGBUR SMALL (A) 05/01/2018 2329   BILIRUBINUR NEGATIVE 05/01/2018 2329   KETONESUR NEGATIVE 05/01/2018 2329   PROTEINUR NEGATIVE 05/01/2018 2329   NITRITE NEGATIVE 05/01/2018 2329   LEUKOCYTESUR NEGATIVE 05/01/2018 2329   Sepsis Labs Invalid input(s): PROCALCITONIN,  WBC,  LACTICIDVEN Microbiology Recent Results (from the past 240 hour(s))  Respiratory Panel by RT PCR (Flu A&B, Covid) - Nasopharyngeal Swab     Status: None   Collection Time: 11/04/19  7:54 PM   Specimen: Nasopharyngeal Swab  Result Value Ref Range Status   SARS Coronavirus 2 by RT PCR NEGATIVE NEGATIVE Final    Comment: (NOTE) SARS-CoV-2 target nucleic acids are NOT DETECTED. The SARS-CoV-2 RNA is generally detectable in upper respiratoy specimens during the acute phase of infection. The lowest concentration of SARS-CoV-2 viral copies this assay can detect is 131 copies/mL. A negative result does not preclude SARS-Cov-2 infection  and should not be used as the sole basis for treatment or other patient management decisions. A negative result may occur  with  improper specimen collection/handling, submission of specimen other than nasopharyngeal swab, presence of viral mutation(s) within the areas targeted by this assay, and inadequate number of viral copies (<131 copies/mL). A negative result must be combined with clinical observations, patient history, and epidemiological information. The expected result is Negative. Fact Sheet for Patients:  PinkCheek.be Fact Sheet for Healthcare Providers:  GravelBags.it This test is not yet ap proved or cleared by the [place] FDA and  has been authorized for detection and/or diagnosis of SARS-CoV-2 by FDA under an Emergency Use Authorization (EUA). This EUA will remain  in effect (meaning this test can be used) for the duration of the COVID-19 declaration under Section 564(b)(1) of the Act, 21 U.S.C. section 360bbb-3(b)(1), unless the authorization is terminated or revoked sooner.    Influenza A by PCR NEGATIVE NEGATIVE Final   Influenza B by PCR NEGATIVE NEGATIVE Final    Comment: (NOTE) The Xpert Xpress SARS-CoV-2/FLU/RSV assay is intended as an aid in  the diagnosis of influenza from Nasopharyngeal swab specimens and  should not be used as a sole basis for treatment. Nasal washings and  aspirates are unacceptable for Xpert Xpress SARS-CoV-2/FLU/RSV  testing. Fact Sheet for Patients: PinkCheek.be Fact Sheet for Healthcare Providers: GravelBags.it This test is not yet approved or cleared by the [place] FDA and  has been authorized for detection and/or diagnosis of SARS-CoV-2 by  FDA under an Emergency Use Authorization (EUA). This EUA will remain  in effect (meaning this test can be used) for the duration of the  Covid-19 declaration under Section  564(b)(1) of the Act, 21  U.S.C. section 360bbb-3(b)(1), unless the authorization is  terminated or revoked. Performed at [facility], [address], [place], [place] [address]      Time coordinating discharge: Over 30 minutes  SIGNED:   Wyvonnia Dusky, MD  [facility] 11/05/2019, 11:15 AM Pager   If 7PM-7AM, please contact night-coverage [email or web address]

## 2019-11-05 NOTE — Progress Notes (Signed)
Rapid Response Event Note  Overview:pt was sitting upright in bed awake and alert on 2lnc increase WOB noted .        Initial Focused Assessment: Pt was sating 96%. Lungs sounds expiratory wheezing no crackles  noted.  Interventions:MD notified morphine and lasix ordered&CXR  Plan of Care (if not transferred): pt received the morphine and it was effective pt had decrease WOB and stated that she felt better. Pt BP was 90/60's lasix on hold until results from xray.pt remains awake and alert and breathing pattern controlled. Nurses told to notify RR if needed.  Event Summary:   at  [id number]    at          [facility]

## 2019-11-05 NOTE — Progress Notes (Signed)
Pt ambulating back to bed from bathroom and complaining of feeling lightheaded and dizzy. MD notified about above, MD placing PRN orders. Per MD cancel discharge for today.

## 2019-11-05 NOTE — Evaluation (Signed)
Physical Therapy Evaluation Patient Details Name: Ashley Bird MRN: [id number] DOB: [birth year] Today's Date: 11/05/2019   History of Present Illness  Pt is an 84 yo female that presented to ED for SOB, chest pain, weakness. Admitted for acute hypoxic respiratory failure due to COPD exacerbation, acute on chronic diastolic CHF. Elevated troponin's attributed to demand ischemia. PMH of CAD, CHF, COPD, DM, HLD, HTN, PAF, CKD.    Clinical Impression  Pt alert, agreeable to PT, requested to utilize bathroom. The patient stated normally she is I/modI for ADLs, ambulation, lives in a mother in law suite attached to her daughters home. Reported 1 fall in the last 6 months, daughter performs cooking, and pt assists with cleaning. Pt stated in the last few days she has had an increased difficulty performing theses tasks/mobility.  The patient demonstrated bed mobility with mod I, sit <> stand transfer for EOB and from standard commode, CGA/supervision. Pt did exhibit initial standing balance deficits, but able to correct with CGA, handheld assist, and no further deficits noted during remainder of mobility. Supervision for toileting tasks and hand washing as well. Pt stated her chest pain was 0-1/10 at rest, and with short ambulation in room stated it was 5/10 with HR in 120s and low 130s. SpO2 >92% throughout mobility. Able to recover and pain decreased with 2-3 minute sitting rest break.  Overall the patient demonstrated mild deficits (see "PT Problem List") that impede the patient's functional abilities, safety, and mobility and would benefit from skilled PT intervention. Recommendation is HHPT with intermittent supervision, RN and MD notified of pt's reported chest pain.     Follow Up Recommendations Home health PT;Supervision - Intermittent    Equipment Recommendations  None recommended by PT;Other (comment)(pt has canes and rollator at home)    Recommendations for Other Services       Precautions  / Restrictions Precautions Precautions: Fall Restrictions Weight Bearing Restrictions: No      Mobility  Bed Mobility Overal bed mobility: Modified Independent                Transfers Overall transfer level: Needs assistance Equipment used: None;1 person hand held assist Transfers: Sit to/from Stand Sit to Stand: Supervision;Min guard            Ambulation/Gait   Gait Distance (Feet): 20 Feet Assistive device: None       General Gait Details: some initial unsteadiness when first coming to stand, able to stand and address balance without assist, and then continue walking with handheld assist.  Stairs            Wheelchair Mobility    Modified Rankin (Stroke Patients Only)       Balance Overall balance assessment: Mild deficits observed, not formally tested                                           Pertinent Vitals/Pain Pain Assessment: 0-10 Pain Score: 5  Pain Location: chest pain Pain Descriptors / Indicators: Aching Pain Intervention(s): Limited activity within patient's tolerance;Monitored during session;Repositioned    Home Living Family/patient expects to be discharged to:: Private residence Living Arrangements: Alone;Children Available Help at Discharge: Family Type of Home: Apartment(similiar to a mother in law attachment to daughters home (able to enter her daughters home from her living room.)) Home Access: Ramped entrance     Home Layout: One level Home Equipment:  Cane - quad;Shower seat;Bedside commode;Other (comment);Walker - 4 wheels Additional Comments: Pt reported 1 fall in the last 6 months that occured while she was trying to weigh herself    Prior Function Level of Independence: Independent with assistive device(s)         Comments: Patient reported in the last few days she has had significant difficulty with ADLs due to SOB and chest pain. normally able to sweep, dust, manages her own medications,  uses AD as needed     Hand Dominance   Dominant Hand: Right    Extremity/Trunk Assessment   Upper Extremity Assessment Upper Extremity Assessment: Overall WFL for tasks assessed    Lower Extremity Assessment Lower Extremity Assessment: Generalized weakness    Cervical / Trunk Assessment Cervical / Trunk Assessment: Normal  Communication   Communication: No difficulties  Cognition Arousal/Alertness: Awake/alert Behavior During Therapy: WFL for tasks assessed/performed Overall Cognitive Status: Within Functional Limits for tasks assessed                                        General Comments      Exercises Other Exercises Other Exercises: Pt able to utilize commode with grab bar, CGA/supervision and was hands at sink mod I   Assessment/Plan    PT Assessment Patient needs continued PT services  PT Problem List         PT Treatment Interventions DME instruction;Therapeutic exercise;Gait training;Balance training;Neuromuscular re-education;Functional mobility training;Therapeutic activities;Patient/family education    PT Goals (Current goals can be found in the Care Plan section)  Acute Rehab PT Goals Patient Stated Goal: to decrease chest pain PT Goal Formulation: With patient Time For Goal Achievement: 11/19/19 Potential to Achieve Goals: Good    Frequency Min 2X/week   Barriers to discharge        Co-evaluation               AM-PAC PT "6 Clicks" Mobility  Outcome Measure Help needed turning from your back to your side while in a flat bed without using bedrails?: None Help needed moving from lying on your back to sitting on the side of a flat bed without using bedrails?: None Help needed moving to and from a bed to a chair (including a wheelchair)?: None Help needed standing up from a chair using your arms (e.g., wheelchair or bedside chair)?: None Help needed to walk in hospital room?: A Little Help needed climbing 3-5 steps with a  railing? : A Little 6 Click Score: 22    End of Session Equipment Utilized During Treatment: Gait belt Activity Tolerance: Patient tolerated treatment well;Patient limited by pain Patient left: in chair;with chair alarm set;with call bell/phone within reach Nurse Communication: Mobility status PT Visit Diagnosis: Other abnormalities of gait and mobility (R26.89);Muscle weakness (generalized) (M62.81);Difficulty in walking, not elsewhere classified (R26.2) Pain - Right/Left: (midline)    Time: 5465-0354 PT Time Calculation (min) (ACUTE ONLY): 16 min   Charges:   PT Evaluation $PT Eval Low Complexity: 1 Low         Lieutenant Diego PT, DPT 10:25 AM,11/05/19

## 2019-11-05 NOTE — Consult Note (Signed)
ANTICOAGULATION CONSULT NOTE - Initial Consult  Pharmacy Consult for Warfarin Dosing Indication: atrial fibrillation  Allergies  Allergen Reactions  . Alendronate Other (See Comments)    Reaction: unknown  . Doxycycline Swelling    Swelling and hemmorage     . Effexor [Venlafaxine] Other (See Comments)    Body ached   . Ferralet [Iron-Folic [id number]] Nausea And Vomiting    Vomiting   . Ferrous Sulfate Nausea And Vomiting  . Penicillins Swelling    Per pt: swelling and hemmorage     . Prozac [Fluoxetine] Other (See Comments)    Reaction: unknown  . Simvastatin Other (See Comments)    Muscle pain   . Sulfa Antibiotics Other (See Comments)    Reaction: unknown  . Zoloft [Sertraline Hcl] Other (See Comments)    Reaction: unknown    Patient Measurements: Height: 5\' 3"  (160 cm) Weight: 74.2 kg (163 lb 9.3 oz) IBW/kg (Calculated) : 52.4 Heparin Dosing Weight: 67.9 kg  Vital Signs: Temp: 98.3 F (36.8 C) (04/26 0243) Temp Source: Oral (04/26 0243) BP: 92/66 (04/26 0243) Pulse Rate: 88 (04/26 0243)  Labs: Recent Labs    11/04/19 1119 11/04/19 1256 11/05/19 0521  HGB 9.9*  --  9.1*  HCT 32.0*  --  30.2*  PLT 196  --  181  LABPROT  --  22.0* 22.5*  INR  --  1.9* 2.0*  CREATININE 1.80*  --  1.89*  TROPONINIHS 24* 26*  --     Estimated Creatinine Clearance: 22.5 mL/min (A) (by C-G formula based on SCr of 1.89 mg/dL (H)).   Medical History: Past Medical History:  Diagnosis Date  . CAD (coronary artery disease)   . CHF (congestive heart failure) ([place])   . COPD (chronic obstructive pulmonary disease) ([place])   . Diabetes ([place])   . HLD (hyperlipidemia)   . HTN (hypertension)   . PAF (paroxysmal atrial fibrillation) ([facility])     Medications:  Medications Prior to Admission  Medication Sig Dispense Refill Last Dose  . acetaminophen (TYLENOL) 325 MG tablet Take 325 mg by mouth every 6 (six) hours as needed. Takes with Tramadol   prn at prn  .  albuterol (ACCUNEB) 1.25 MG/3ML nebulizer solution Inhale 1 ampule into the lungs every 4 (four) hours as needed for wheezing or shortness of breath.    prn at prn  . albuterol (PROVENTIL HFA;VENTOLIN HFA) 108 (90 Base) MCG/ACT inhaler Inhale 2 puffs into the lungs every 4 (four) hours as needed for wheezing or shortness of breath.    prn at prn  . amiodarone (PACERONE) 200 MG tablet Take 200 mg by mouth daily.  1 11/04/2019 at 0800  . b complex vitamins tablet Take 1 tablet by mouth daily.   11/04/2019 at 0800  . calcium carbonate (OSCAL) 1500 (600 Ca) MG TABS tablet Take 600 mg of elemental calcium by mouth daily with breakfast.   11/03/2019 at 0800  . cetirizine (ZYRTEC) 10 MG tablet Take 10 mg by mouth every other day.    11/04/2019 at 0800  . Cholecalciferol (VITAMIN D3) 5000 units TABS Take 1 tablet by mouth daily.   11/03/2019 at Unknown time  . furosemide (LASIX) 20 MG tablet Take 1 tablet (20 mg total) by mouth 2 (two) times daily. (Patient taking differently: Take 20 mg by mouth 2 (two) times daily as needed. Only takes if gained 2 pounds) 60 tablet 0 prn at prn  . Melatonin 1 MG TABS Take 1 mg by mouth at bedtime  as needed (sleep).    11/03/2019 at Unknown time  . Multiple Vitamins-Minerals (MULTIVITAMIN GUMMIES WOMENS) CHEW Chew 1 tablet by mouth daily.   11/03/2019 at Unknown time  . olmesartan-hydrochlorothiazide (BENICAR HCT) 40-25 MG tablet Take 1 tablet by mouth daily.  0 11/04/2019 at 0800  . traMADol (ULTRAM) 50 MG tablet Take 50-100 mg by mouth every 4 (four) hours as needed for moderate pain. Takes with Warfarin  1 11/04/2019 at 0800  . warfarin (COUMADIN) 2 MG tablet Take 1.5-2 mg by mouth daily. 2 mg on Wednesday, Saturday, 1.5 mg all other days  0 11/03/2019 at Unknown time   Scheduled:  . amiodarone  200 mg Oral Daily  . budesonide (PULMICORT) nebulizer solution  0.25 mg Nebulization BID  . furosemide  40 mg Oral Daily  . ipratropium  0.5 mg Nebulization Q6H  . levalbuterol  1.25 mg  Nebulization Q6H  . methylPREDNISolone (SOLU-MEDROL) injection  40 mg Intravenous Q12H  . Warfarin - Pharmacist Dosing Inpatient   Does not apply q1600   Infusions:   PRN: acetaminophen **OR** acetaminophen, ondansetron **OR** ondansetron (ZOFRAN) IV, traMADol Anti-infectives (From admission, onward)   None      Assessment: Pharmacy has been consulted for Warfarin dosing in 84yo patient with history of atrial fibrillation. Patient also take Amiodarone PTA that will be continued in-patient. Last home dose taken 4/24.  Home regimen: 1.5mg  on Wednesdays and Saturdays. 2mg  on all other days.    INR Warfarin Dose Given 4/25:  1.9 2.0mg  4/26: 2.0  Goal of Therapy:  INR 2-3 Monitor platelets by anticoagulation protocol: Yes   Plan:  INR 2.0 Therapeutic. Will order Warfarin 2mg  PO x 1 dose to be given this evening.  Will order daily INRs and continue to monitor patient's H&H.  Lu Duffel, PharmD, BCPS Clinical Pharmacist 11/05/2019 7:23 AM

## 2019-11-05 NOTE — TOC Transition Note (Signed)
Transition of Care [facility]) - CM/SW Discharge Note   Patient Details  Name: Ashley Bird MRN: [id number] Date of Birth: 1936/01/26  [facility] CM/SW Contact:  Elease Hashimoto, LCSW Phone Number: 11/05/2019, 10:44 AM   Clinical Narrative:   Met with pt to discuss discharge needs. She had had [facility] in the past and is agreeable to them again. She has all DME form previous admits. She is back to mod-I level and feels ready to go home today. She drives herself to appointments and her daughter is supportive. Have set up HHPT and pt is ready for DC today. MD aware and will plan to DC her.      Barriers to Discharge: No Barriers Identified   Patient Goals and CMS Choice Patient states their goals for this hospitalization and ongoing recovery are:: I'm ready to go home CMS Medicare.gov Compare Post Acute Care list provided to:: Patient Choice offered to / list presented to : Patient  Discharge Placement                       Discharge Plan and Services In-house Referral: Clinical Social Work   Post Acute Care Choice: Home Health          DME Arranged: (NA has from previous admits)         HH Arranged: PT HH Agency: [facility] Date [place]: 11/05/19 Time [place]: [id number] Representative spoke with at [place]: [place] ([place]) Interventions     Readmission Risk Interventions No flowsheet data found.

## 2019-11-06 DIAGNOSIS — E785 Hyperlipidemia, unspecified: Secondary | ICD-10-CM | POA: Diagnosis present

## 2019-11-06 DIAGNOSIS — I209 Angina pectoris, unspecified: Secondary | ICD-10-CM | POA: Diagnosis not present

## 2019-11-06 DIAGNOSIS — Z20822 Contact with and (suspected) exposure to covid-19: Secondary | ICD-10-CM | POA: Diagnosis present

## 2019-11-06 DIAGNOSIS — Z87891 Personal history of nicotine dependence: Secondary | ICD-10-CM | POA: Diagnosis not present

## 2019-11-06 DIAGNOSIS — I248 Other forms of acute ischemic heart disease: Secondary | ICD-10-CM | POA: Diagnosis present

## 2019-11-06 DIAGNOSIS — I4821 Permanent atrial fibrillation: Secondary | ICD-10-CM | POA: Diagnosis present

## 2019-11-06 DIAGNOSIS — J441 Chronic obstructive pulmonary disease with (acute) exacerbation: Secondary | ICD-10-CM | POA: Diagnosis present

## 2019-11-06 DIAGNOSIS — Z7901 Long term (current) use of anticoagulants: Secondary | ICD-10-CM | POA: Diagnosis not present

## 2019-11-06 DIAGNOSIS — R531 Weakness: Secondary | ICD-10-CM | POA: Diagnosis not present

## 2019-11-06 DIAGNOSIS — I5033 Acute on chronic diastolic (congestive) heart failure: Secondary | ICD-10-CM | POA: Diagnosis present

## 2019-11-06 DIAGNOSIS — N179 Acute kidney failure, unspecified: Secondary | ICD-10-CM | POA: Diagnosis present

## 2019-11-06 DIAGNOSIS — Z7951 Long term (current) use of inhaled steroids: Secondary | ICD-10-CM | POA: Diagnosis not present

## 2019-11-06 DIAGNOSIS — Z833 Family history of diabetes mellitus: Secondary | ICD-10-CM | POA: Diagnosis not present

## 2019-11-06 DIAGNOSIS — E1122 Type 2 diabetes mellitus with diabetic chronic kidney disease: Secondary | ICD-10-CM | POA: Diagnosis present

## 2019-11-06 DIAGNOSIS — Z90710 Acquired absence of both cervix and uterus: Secondary | ICD-10-CM | POA: Diagnosis not present

## 2019-11-06 DIAGNOSIS — Z79891 Long term (current) use of opiate analgesic: Secondary | ICD-10-CM | POA: Diagnosis not present

## 2019-11-06 DIAGNOSIS — R0602 Shortness of breath: Secondary | ICD-10-CM | POA: Diagnosis present

## 2019-11-06 DIAGNOSIS — Z66 Do not resuscitate: Secondary | ICD-10-CM | POA: Diagnosis present

## 2019-11-06 DIAGNOSIS — Z800 Family history of malignant neoplasm of digestive organs: Secondary | ICD-10-CM | POA: Diagnosis not present

## 2019-11-06 DIAGNOSIS — Z79899 Other long term (current) drug therapy: Secondary | ICD-10-CM | POA: Diagnosis not present

## 2019-11-06 DIAGNOSIS — Z7952 Long term (current) use of systemic steroids: Secondary | ICD-10-CM | POA: Diagnosis not present

## 2019-11-06 DIAGNOSIS — I25119 Atherosclerotic heart disease of native coronary artery with unspecified angina pectoris: Secondary | ICD-10-CM | POA: Diagnosis present

## 2019-11-06 DIAGNOSIS — I130 Hypertensive heart and chronic kidney disease with heart failure and stage 1 through stage 4 chronic kidney disease, or unspecified chronic kidney disease: Secondary | ICD-10-CM | POA: Diagnosis present

## 2019-11-06 DIAGNOSIS — J9601 Acute respiratory failure with hypoxia: Secondary | ICD-10-CM | POA: Diagnosis present

## 2019-11-06 DIAGNOSIS — N1831 Chronic kidney disease, stage 3a: Secondary | ICD-10-CM | POA: Diagnosis present

## 2019-11-06 DIAGNOSIS — Z825 Family history of asthma and other chronic lower respiratory diseases: Secondary | ICD-10-CM | POA: Diagnosis not present

## 2019-11-06 LAB — GLUCOSE, CAPILLARY: Glucose-Capillary: 191 mg/dL — ABNORMAL HIGH (ref 70–99)

## 2019-11-06 LAB — PROTIME-INR
INR: 2.3 — ABNORMAL HIGH (ref 0.8–1.2)
Prothrombin Time: 25.2 seconds — ABNORMAL HIGH (ref 11.4–15.2)

## 2019-11-06 MED ORDER — FUROSEMIDE 10 MG/ML IJ SOLN
20.0000 mg | Freq: Every day | INTRAMUSCULAR | Status: DC
  Administered 2019-11-06 – 2019-11-07 (×2): 20 mg via INTRAVENOUS
  Filled 2019-11-06 (×2): qty 4

## 2019-11-06 MED ORDER — PANTOPRAZOLE SODIUM 40 MG PO TBEC
40.0000 mg | DELAYED_RELEASE_TABLET | Freq: Every day | ORAL | Status: DC
  Administered 2019-11-06 – 2019-11-07 (×2): 40 mg via ORAL
  Filled 2019-11-06 (×2): qty 1

## 2019-11-06 MED ORDER — WARFARIN SODIUM 2 MG PO TABS
2.0000 mg | ORAL_TABLET | Freq: Once | ORAL | Status: AC
  Administered 2019-11-06: 2 mg via ORAL
  Filled 2019-11-06: qty 1

## 2019-11-06 MED ORDER — BISACODYL 10 MG RE SUPP
10.0000 mg | Freq: Every day | RECTAL | Status: DC | PRN
  Filled 2019-11-06: qty 1

## 2019-11-06 MED ORDER — BISACODYL 5 MG PO TBEC
5.0000 mg | DELAYED_RELEASE_TABLET | Freq: Once | ORAL | Status: AC
  Administered 2019-11-06: 5 mg via ORAL
  Filled 2019-11-06: qty 1

## 2019-11-06 NOTE — Progress Notes (Signed)
   11/05/19 2046  Clinical Encounter Type  Visited With Patient  Visit Type Initial  Referral From Nurse  Consult/Referral To Chaplain  Spiritual Encounters  Spiritual Needs Other (Comment)  [place] received page at 2046 for a rapid response. Pt was alert and awake upon arrival. Was talkative and repeatedly thanked medical staff for being there for her. [place] introduced self and pt was appreciative of visit. No further needs were expressed.

## 2019-11-06 NOTE — Consult Note (Signed)
ANTICOAGULATION CONSULT NOTE - Initial Consult  Pharmacy Consult for Warfarin Dosing Indication: atrial fibrillation  Allergies  Allergen Reactions  . Alendronate Other (See Comments)    Reaction: unknown  . Doxycycline Swelling    Swelling and hemmorage     . Effexor [Venlafaxine] Other (See Comments)    Body ached   . Ferralet [Iron-Folic [id number]] Nausea And Vomiting    Vomiting   . Ferrous Sulfate Nausea And Vomiting  . Penicillins Swelling    Per pt: swelling and hemmorage     . Prozac [Fluoxetine] Other (See Comments)    Reaction: unknown  . Simvastatin Other (See Comments)    Muscle pain   . Sulfa Antibiotics Other (See Comments)    Reaction: unknown  . Zoloft [Sertraline Hcl] Other (See Comments)    Reaction: unknown    Patient Measurements: Height: 5\' 3"  (160 cm) Weight: 72.4 kg (159 lb 9.8 oz) IBW/kg (Calculated) : 52.4 Heparin Dosing Weight: 67.9 kg  Vital Signs: Temp: 98 F (36.7 C) (04/27 0400) Temp Source: Oral (04/27 0400) BP: 94/69 (04/27 0400) Pulse Rate: 88 (04/27 0400)  Labs: Recent Labs    11/04/19 1119 11/04/19 1256 11/05/19 0521 11/06/19 0535  HGB 9.9*  --  9.1*  --   HCT 32.0*  --  30.2*  --   PLT 196  --  181  --   LABPROT  --  22.0* 22.5* 25.2*  INR  --  1.9* 2.0* 2.3*  CREATININE 1.80*  --  1.89*  --   TROPONINIHS 24* 26*  --   --     Estimated Creatinine Clearance: 22.3 mL/min (A) (by C-G formula based on SCr of 1.89 mg/dL (H)).   Medical History: Past Medical History:  Diagnosis Date  . CAD (coronary artery disease)   . CHF (congestive heart failure) ([place])   . COPD (chronic obstructive pulmonary disease) ([place])   . Diabetes ([place])   . HLD (hyperlipidemia)   . HTN (hypertension)   . PAF (paroxysmal atrial fibrillation) ([facility])     Medications:  Medications Prior to Admission  Medication Sig Dispense Refill Last Dose  . acetaminophen (TYLENOL) 325 MG tablet Take 325 mg by mouth every 6 (six) hours as  needed. Takes with Tramadol   prn at prn  . albuterol (ACCUNEB) 1.25 MG/3ML nebulizer solution Inhale 1 ampule into the lungs every 4 (four) hours as needed for wheezing or shortness of breath.    prn at prn  . albuterol (PROVENTIL HFA;VENTOLIN HFA) 108 (90 Base) MCG/ACT inhaler Inhale 2 puffs into the lungs every 4 (four) hours as needed for wheezing or shortness of breath.    prn at prn  . amiodarone (PACERONE) 200 MG tablet Take 200 mg by mouth daily.  1 11/04/2019 at 0800  . b complex vitamins tablet Take 1 tablet by mouth daily.   11/04/2019 at 0800  . calcium carbonate (OSCAL) 1500 (600 Ca) MG TABS tablet Take 600 mg of elemental calcium by mouth daily with breakfast.   11/03/2019 at 0800  . cetirizine (ZYRTEC) 10 MG tablet Take 10 mg by mouth every other day.    11/04/2019 at 0800  . Cholecalciferol (VITAMIN D3) 5000 units TABS Take 1 tablet by mouth daily.   11/03/2019 at Unknown time  . furosemide (LASIX) 20 MG tablet Take 1 tablet (20 mg total) by mouth 2 (two) times daily. (Patient taking differently: Take 20 mg by mouth 2 (two) times daily as needed. Only takes if gained 2 pounds)  60 tablet 0 prn at prn  . Melatonin 1 MG TABS Take 1 mg by mouth at bedtime as needed (sleep).    11/03/2019 at Unknown time  . Multiple Vitamins-Minerals (MULTIVITAMIN GUMMIES WOMENS) CHEW Chew 1 tablet by mouth daily.   11/03/2019 at Unknown time  . olmesartan-hydrochlorothiazide (BENICAR HCT) 40-25 MG tablet Take 1 tablet by mouth daily.  0 11/04/2019 at 0800  . traMADol (ULTRAM) 50 MG tablet Take 50-100 mg by mouth every 4 (four) hours as needed for moderate pain. Takes with Warfarin  1 11/04/2019 at 0800  . warfarin (COUMADIN) 2 MG tablet Take 1.5-2 mg by mouth daily. 2 mg on Wednesday, Saturday, 1.5 mg all other days  0 11/03/2019 at Unknown time   Scheduled:  . amiodarone  200 mg Oral Daily  . budesonide (PULMICORT) nebulizer solution  0.25 mg Nebulization BID  . furosemide      . ipratropium  0.5 mg  Nebulization Q6H  . levalbuterol  1.25 mg Nebulization Q6H  . methylPREDNISolone (SOLU-MEDROL) injection  40 mg Intravenous Q12H  . ranolazine  500 mg Oral BID  . Warfarin - Pharmacist Dosing Inpatient   Does not apply q1600   Infusions:   PRN: acetaminophen **OR** acetaminophen, meclizine, morphine injection, ondansetron **OR** ondansetron (ZOFRAN) IV, traMADol Anti-infectives (From admission, onward)   None      Assessment: Pharmacy has been consulted for Warfarin dosing in 84yo patient with history of atrial fibrillation. Patient also take Amiodarone PTA that will be continued in-patient. Last home dose taken 4/24.  Home regimen: 1.5mg  on Wednesdays and Saturdays. 2mg  on all other days.    INR Warfarin Dose Given 4/25:  1.9 2.0mg  4/26: 2.0 2.0mg  4/27: 2.3    Goal of Therapy:  INR 2-3 Monitor platelets by anticoagulation protocol: Yes   Plan:  INR 2.3 Therapeutic. Will order Warfarin 2mg  PO x 1 dose to be given this evening.  Will order daily INRs and continue to monitor patient's H&H.  Lu Duffel, PharmD, BCPS Clinical Pharmacist 11/06/2019 7:16 AM

## 2019-11-06 NOTE — Progress Notes (Addendum)
PROGRESS NOTE    Ashley Bird  BZJ:[id number] DOB: 30-Dec-1935 DOA: 11/04/2019 PCP: [place] Pink, MD      Assessment & Plan:   Active Problems:   COPD exacerbation ([place])  Acute hypoxic respiratory failure: likely due to COPD exacerbation, acute on chronic diastolic CHF.  Still w/ shortness of breath w/ exertion. Weaned off of supplemental oxygen. COVID19 & influenza are both neg   COPD exacerbation: still w/ shortness of breath w/ exertion. Continue on steroids, bronchodilators & mucinex. Encourage incentive spirometry & flutter valve.   Likely acute on chronic diastolic CHF exacerbation: continue on lasix. Monitor I/Os. Echo in 2019 shows normal EF  Permanent atrial fibrillation: continue amiodarone, coumadin   Essential hypertension: hold Benicar. Will continue to monitor   CKDIV: Cr is labile. Will continue to monitor   Elevated troponin: likely due demand ischemia. Continue on ranexa for angina.   Weakness: PT recs home health. Home health orders placed     DVT prophylaxis: warfarin  Code Status: DNR Family Communication: discussed w/ pt's daughter who is at bedside and answer her questions  Disposition Plan:  Pt was d/c yesterday but when the pt got up to go to the bathroom she became really short of breath & dizzy so pt's d/c was held.  Likely d/c home w/ home health tomorrow if respiratory status has improved. Unlikely no barriers   Consultants:   None    Procedures:    Antimicrobials:    Subjective: Pt c/o shortness of breath especially w/ exertion.   Objective: Vitals:   11/06/19 0804 11/06/19 0804 11/06/19 1139 11/06/19 1336  BP:  93/63 97/61   Pulse:  69 95   Resp:      Temp:  98.6 F (37 C) 99.1 F (37.3 C)   TempSrc:  Oral Oral   SpO2: 95% 96% 94% 93%  Weight:      Height:        Intake/Output Summary (Last 24 hours) at 11/06/2019 1345 Last data filed at 11/06/2019 1000 Gross per 24 hour  Intake 360 ml  Output 225 ml  Net 135  ml   Filed Weights   11/04/19 1116 11/04/19 2100 11/06/19 0430  Weight: 73.5 kg 74.2 kg 72.4 kg    Examination:  General exam: Appears calm and comfortable  Respiratory system: decreased breath sounds b/l otherwise clear. No rales  Cardiovascular system: irregularly irregular. No rubs, gallops or clicks.  Gastrointestinal system: Abdomen is nondistended, soft and nontender. Normal bowel sounds heard. Central nervous system: Alert and oriented.  Moves all 4 extremities. Psychiatry: Judgement and insight appear normal. Flat mood and affect      Data Reviewed: I have personally reviewed following labs and imaging studies  CBC: Recent Labs  Lab 11/04/19 1119 11/05/19 0521  WBC 9.0 4.2  HGB 9.9* 9.1*  HCT 32.0* 30.2*  MCV 90.4 91.5  PLT 196 017   Basic Metabolic Panel: Recent Labs  Lab 11/04/19 1119 11/05/19 0521  NA 137 139  K 4.4 4.3  CL 102 103  CO2 27 27  GLUCOSE 125* 213*  BUN 27* 37*  CREATININE 1.80* 1.89*  CALCIUM 9.3 9.3  MG  --  2.0   GFR: Estimated Creatinine Clearance: 22.3 mL/min (A) (by C-G formula based on SCr of 1.89 mg/dL (H)). Liver Function Tests: Recent Labs  Lab 11/05/19 0521  AST 17  ALT 14  ALKPHOS 60  BILITOT 0.6  PROT 6.2*  ALBUMIN 3.1*   No results for input(s): LIPASE,  AMYLASE in the last 168 hours. No results for input(s): AMMONIA in the last 168 hours. Coagulation Profile: Recent Labs  Lab 11/04/19 1256 11/05/19 0521 11/06/19 0535  INR 1.9* 2.0* 2.3*   Cardiac Enzymes: No results for input(s): CKTOTAL, CKMB, CKMBINDEX, TROPONINI in the last 168 hours. BNP (last 3 results) No results for input(s): PROBNP in the last 8760 hours. HbA1C: No results for input(s): HGBA1C in the last 72 hours. CBG: Recent Labs  Lab 11/04/19 2159  GLUCAP 184*   Lipid Profile: No results for input(s): CHOL, HDL, LDLCALC, TRIG, CHOLHDL, LDLDIRECT in the last 72 hours. Thyroid Function Tests: No results for input(s): TSH, T4TOTAL,  FREET4, T3FREE, THYROIDAB in the last 72 hours. Anemia Panel: No results for input(s): VITAMINB12, FOLATE, FERRITIN, TIBC, IRON, RETICCTPCT in the last 72 hours. Sepsis Labs: No results for input(s): PROCALCITON, LATICACIDVEN in the last 168 hours.  Recent Results (from the past 240 hour(s))  Respiratory Panel by RT PCR (Flu A&B, Covid) - Nasopharyngeal Swab     Status: None   Collection Time: 11/04/19  7:54 PM   Specimen: Nasopharyngeal Swab  Result Value Ref Range Status   SARS Coronavirus 2 by RT PCR NEGATIVE NEGATIVE Final    Comment: (NOTE) SARS-CoV-2 target nucleic acids are NOT DETECTED. The SARS-CoV-2 RNA is generally detectable in upper respiratoy specimens during the acute phase of infection. The lowest concentration of SARS-CoV-2 viral copies this assay can detect is 131 copies/mL. A negative result does not preclude SARS-Cov-2 infection and should not be used as the sole basis for treatment or other patient management decisions. A negative result may occur with  improper specimen collection/handling, submission of specimen other than nasopharyngeal swab, presence of viral mutation(s) within the areas targeted by this assay, and inadequate number of viral copies (<131 copies/mL). A negative result must be combined with clinical observations, patient history, and epidemiological information. The expected result is Negative. Fact Sheet for Patients:  PinkCheek.be Fact Sheet for Healthcare Providers:  GravelBags.it This test is not yet ap proved or cleared by the [place] FDA and  has been authorized for detection and/or diagnosis of SARS-CoV-2 by FDA under an Emergency Use Authorization (EUA). This EUA will remain  in effect (meaning this test can be used) for the duration of the COVID-19 declaration under Section 564(b)(1) of the Act, 21 U.S.C. section 360bbb-3(b)(1), unless the authorization is terminated or  revoked sooner.    Influenza A by PCR NEGATIVE NEGATIVE Final   Influenza B by PCR NEGATIVE NEGATIVE Final    Comment: (NOTE) The Xpert Xpress SARS-CoV-2/FLU/RSV assay is intended as an aid in  the diagnosis of influenza from Nasopharyngeal swab specimens and  should not be used as a sole basis for treatment. Nasal washings and  aspirates are unacceptable for Xpert Xpress SARS-CoV-2/FLU/RSV  testing. Fact Sheet for Patients: PinkCheek.be Fact Sheet for Healthcare Providers: GravelBags.it This test is not yet approved or cleared by the [place] FDA and  has been authorized for detection and/or diagnosis of SARS-CoV-2 by  FDA under an Emergency Use Authorization (EUA). This EUA will remain  in effect (meaning this test can be used) for the duration of the  Covid-19 declaration under Section 564(b)(1) of the Act, 21  U.S.C. section 360bbb-3(b)(1), unless the authorization is  terminated or revoked. Performed at [facility], [address], [place], [place] [address]          Radiology Studies: [place] [facility] [place] 1 View  Result Date: 11/05/2019 CLINICAL  DATA:  Shortness of breath. EXAM: PORTABLE CHEST 1 VIEW COMPARISON:  Frontal and lateral views yesterday. FINDINGS: Stable cardiomegaly. Aortic atherosclerosis. Increase in right pleural effusion from yesterday. Left pleural effusion may be slightly improved. Associated bibasilar opacities. Peribronchial thickening which is increasing. Possible retrocardiac hiatal hernia. No pneumothorax. No acute osseous abnormalities are seen. IMPRESSION: 1. Increasing right pleural effusion. Left pleural effusion may be slightly improved. Associated bibasilar opacities favor atelectasis. 2. Increasing peribronchial thickening which may be congestive or bronchitic. 3. Stable cardiomegaly. Aortic Atherosclerosis (ICD10-I70.0). Electronically Signed   By: Keith Rake M.D.   On:  11/05/2019 21:30        Scheduled Meds: . amiodarone  200 mg Oral Daily  . budesonide (PULMICORT) nebulizer solution  0.25 mg Nebulization BID  . furosemide  20 mg Intravenous Daily  . ipratropium  0.5 mg Nebulization Q6H  . levalbuterol  1.25 mg Nebulization Q6H  . methylPREDNISolone (SOLU-MEDROL) injection  40 mg Intravenous Q12H  . pantoprazole  40 mg Oral Daily  . ranolazine  500 mg Oral BID  . warfarin  2 mg Oral ONCE-1600  . Warfarin - Pharmacist Dosing Inpatient   Does not apply q1600   Continuous Infusions:   LOS: 0 days    Time spent: 35 mins    Wyvonnia Dusky, MD [facility] Pager [phone]  If 7PM-7AM, please contact night-coverage [email or web address] 11/06/2019, 1:45 PM

## 2019-11-07 DIAGNOSIS — J441 Chronic obstructive pulmonary disease with (acute) exacerbation: Secondary | ICD-10-CM | POA: Diagnosis not present

## 2019-11-07 DIAGNOSIS — I5033 Acute on chronic diastolic (congestive) heart failure: Secondary | ICD-10-CM | POA: Diagnosis not present

## 2019-11-07 DIAGNOSIS — N1832 Chronic kidney disease, stage 3b: Secondary | ICD-10-CM

## 2019-11-07 DIAGNOSIS — N179 Acute kidney failure, unspecified: Secondary | ICD-10-CM | POA: Diagnosis not present

## 2019-11-07 DIAGNOSIS — I209 Angina pectoris, unspecified: Secondary | ICD-10-CM | POA: Diagnosis not present

## 2019-11-07 LAB — CBC
HCT: 29 % — ABNORMAL LOW (ref 36.0–46.0)
Hemoglobin: 9.2 g/dL — ABNORMAL LOW (ref 12.0–15.0)
MCH: 28 pg (ref 26.0–34.0)
MCHC: 31.7 g/dL (ref 30.0–36.0)
MCV: 88.1 fL (ref 80.0–100.0)
Platelets: 228 10*3/uL (ref 150–400)
RBC: 3.29 MIL/uL — ABNORMAL LOW (ref 3.87–5.11)
RDW: 14 % (ref 11.5–15.5)
WBC: 8.8 10*3/uL (ref 4.0–10.5)
nRBC: 0 % (ref 0.0–0.2)

## 2019-11-07 LAB — BASIC METABOLIC PANEL
Anion gap: 11 (ref 5–15)
BUN: 68 mg/dL — ABNORMAL HIGH (ref 8–23)
CO2: 25 mmol/L (ref 22–32)
Calcium: 9.4 mg/dL (ref 8.9–10.3)
Chloride: 98 mmol/L (ref 98–111)
Creatinine, Ser: 2.68 mg/dL — ABNORMAL HIGH (ref 0.44–1.00)
GFR calc Af Amer: 19 mL/min — ABNORMAL LOW (ref >60)
GFR calc non Af Amer: 16 mL/min — ABNORMAL LOW (ref >60)
Glucose, Bld: 188 mg/dL — ABNORMAL HIGH (ref 70–99)
Potassium: 5 mmol/L (ref 3.5–5.1)
Sodium: 134 mmol/L — ABNORMAL LOW (ref 135–145)

## 2019-11-07 LAB — PROTIME-INR
INR: 2.9 — ABNORMAL HIGH (ref 0.8–1.2)
Prothrombin Time: 29.7 seconds — ABNORMAL HIGH (ref 11.4–15.2)

## 2019-11-07 MED ORDER — PANTOPRAZOLE SODIUM 40 MG PO TBEC: 40.0000 mg | DELAYED_RELEASE_TABLET | Freq: Every day | ORAL | 0 refills | Status: AC

## 2019-11-07 MED ORDER — FUROSEMIDE 20 MG PO TABS: 40.0000 mg | ORAL_TABLET | Freq: Every day | ORAL | 0 refills | Status: DC

## 2019-11-07 MED ORDER — WARFARIN SODIUM 1 MG PO TABS
1.0000 mg | ORAL_TABLET | Freq: Once | ORAL | Status: DC
  Filled 2019-11-07: qty 1

## 2019-11-07 MED ORDER — WARFARIN SODIUM 1 MG PO TABS: 1.5000 mg | ORAL_TABLET | Freq: Once | ORAL | Status: DC

## 2019-11-07 MED ORDER — NITROGLYCERIN 0.4 MG SL SUBL: 0.4000 mg | SUBLINGUAL_TABLET | SUBLINGUAL | 0 refills | Status: AC | PRN

## 2019-11-07 MED ORDER — FUROSEMIDE 20 MG PO TABS: 20.0000 mg | ORAL_TABLET | Freq: Every day | ORAL | 0 refills | Status: AC

## 2019-11-07 NOTE — Progress Notes (Signed)
Physical Therapy Treatment Patient Details Name: Ashley Bird MRN: [id number] DOB: 02-13-1936 Today's Date: 11/07/2019    History of Present Illness Pt is an 84 yo female that presented to ED for SOB, chest pain, weakness. Admitted for acute hypoxic respiratory failure due to COPD exacerbation, acute on chronic diastolic CHF. Elevated troponin's attributed to demand ischemia. PMH of CAD, CHF, COPD, DM, HLD, HTN, PAF, CKD.    PT Comments    Pt and daughter in room.  Ready for session.  Stated she needs to use BR.  OOB with no assist.  Pt asks for walker for gait to/from bathroom to void.  She is generally steady with RW. Stated she has one but does not typically use at home.  Encouraged her to do so until strength increases.  Upon return to chair pt c/o chest pain/tightness 5/10.  BP 71/58 P 98.  Rechecked for accuracy and BP 110/68 P 93 both in L arm.  She denies any c/o dizziness and chest pain is relieved with rest.  She stated she wishes she had oxygen for mobility and that she thought that would make her feel better.  Sats 99/100% at rest.  Encouraged her to discuss with her MD.  Discussed response to activity with RN. She remained in recliner after session per her choice.   Follow Up Recommendations  Home health PT;Supervision - Intermittent     Equipment Recommendations  Rolling walker with 5" wheels(has at home)    Recommendations for Other Services       Precautions / Restrictions Precautions Precautions: Fall Restrictions Weight Bearing Restrictions: No    Mobility  Bed Mobility Overal bed mobility: Modified Independent                Transfers Overall transfer level: Needs assistance Equipment used: Rolling walker (2 wheeled) Transfers: Sit to/from Stand Sit to Stand: Supervision;Min guard            Ambulation/Gait Ambulation/Gait assistance: Min guard Gait Distance (Feet): 10 Feet Assistive device: Rolling walker (2 wheeled) Gait Pattern/deviations:  Step-to pattern Gait velocity: decreased   General Gait Details: prefers to use RW for gait today.  Has RW at home that she typically does not use but pt and daughter stated she would.   Stairs             Wheelchair Mobility    Modified Rankin (Stroke Patients Only)       Balance Overall balance assessment: Mild deficits observed, not formally tested                                          Cognition Arousal/Alertness: Awake/alert Behavior During Therapy: WFL for tasks assessed/performed Overall Cognitive Status: Within Functional Limits for tasks assessed                                        Exercises      General Comments        Pertinent Vitals/Pain Pain Assessment: 0-10 Pain Score: 5  Pain Location: chest pain Pain Descriptors / Indicators: Aching Pain Intervention(s): Limited activity within patient's tolerance;Monitored during session    Home Living                      Prior Function  PT Goals (current goals can now be found in the care plan section) Progress towards PT goals: Progressing toward goals    Frequency    Min 2X/week      PT Plan Current plan remains appropriate    Co-evaluation              AM-PAC PT "6 Clicks" Mobility   Outcome Measure  Help needed turning from your back to your side while in a flat bed without using bedrails?: None Help needed moving from lying on your back to sitting on the side of a flat bed without using bedrails?: None Help needed moving to and from a bed to a chair (including a wheelchair)?: None Help needed standing up from a chair using your arms (e.g., wheelchair or bedside chair)?: None Help needed to walk in hospital room?: A Little Help needed climbing 3-5 steps with a railing? : A Little 6 Click Score: 22    End of Session Equipment Utilized During Treatment: Gait belt Activity Tolerance: Patient limited by pain Patient  left: in chair;with chair alarm set;with call bell/phone within reach;with family/visitor present Nurse Communication: Mobility status;Other (comment)(chest pain with mobility)       Time: 8483-5075 PT Time Calculation (min) (ACUTE ONLY): 10 min  Charges:  $Gait Training: 8-22 mins                    Chesley Noon, PTA 11/07/19, 9:40 AM

## 2019-11-07 NOTE — Plan of Care (Signed)

## 2019-11-07 NOTE — Discharge Instructions (Signed)
COPD and Physical Activity Chronic obstructive pulmonary disease (COPD) is a long-term (chronic) condition that affects the lungs. COPD is a general term that can be used to describe many different lung problems that cause lung swelling (inflammation) and limit airflow, including chronic bronchitis and emphysema. The main symptom of COPD is shortness of breath, which makes it harder to do even simple tasks. This can also make it harder to exercise and be active. Talk with your health care provider about treatments to help you breathe better and actions you can take to prevent breathing problems during physical activity. What are the benefits of exercising with COPD? Exercising regularly is an important part of a healthy lifestyle. You can still exercise and do physical activities even though you have COPD. Exercise and physical activity improve your shortness of breath by increasing blood flow (circulation). This causes your heart to pump more oxygen through your body. Moderate exercise can improve your:  Oxygen use.        Energy level.  Shortness of breath.  Strength in your breathing muscles.  Heart health.  Sleep.  Self-esteem and feelings of self-worth.  Depression, stress, and anxiety levels. Exercise can benefit everyone with COPD. The severity of your disease may affect how hard you can exercise, especially at first, but everyone can benefit. Talk with your health care provider about how much exercise is safe for you, and which activities and exercises are safe for you. What actions can I take to prevent breathing problems during physical activity?  Sign up for a pulmonary rehabilitation program. This type of program may include: ? Education about lung diseases. ? Exercise classes that teach you how to exercise and be more active while improving your breathing. This usually involves:  Exercise using your lower extremities, such as a stationary bicycle.  About 30 minutes of  exercise, 2 to 5 times per week, for 6 to 12 weeks  Strength training, such as push ups or leg lifts. ? Nutrition education. ? Group classes in which you can talk with others who also have COPD and learn ways to manage stress.  If you use an oxygen tank, you should use it while you exercise. Work with your health care provider to adjust your oxygen for your physical activity. Your resting flow rate is different from your flow rate during physical activity.  While you are exercising: ? Take slow breaths. ? Pace yourself and do not try to go too fast. ? Purse your lips while breathing out. Pursing your lips is similar to a kissing or whistling position. ? If doing exercise that uses a quick burst of effort, such as weight lifting:  Breathe in before starting the exercise.  Breathe out during the hardest part of the exercise (such as raising the weights). Where to find support You can find support for exercising with COPD from:  Your health care provider.  A pulmonary rehabilitation program.  Your local health department or community health programs.  Support groups, online or in-person. Your health care provider may be able to recommend support groups. Where to find more information You can find more information about exercising with COPD from:  American Lung Association: ClassInsider.se.  COPD Foundation: https://www.rivera.net/. Contact a health care provider if:  Your symptoms get worse.  You have chest pain.  You have nausea.  You have a fever.  You have trouble talking or catching your breath.  You want to start a new exercise program or a new activity. Summary  COPD  is a general term that can be used to describe many different lung problems that cause lung swelling (inflammation) and limit airflow. This includes chronic bronchitis and emphysema.  Exercise and physical activity improve your shortness of breath by increasing blood flow (circulation). This causes your heart to  provide more oxygen to your body.  Contact your health care provider before starting any exercise program or new activity. Ask your health care provider what exercises and activities are safe for you. This information is not intended to replace advice given to you by your health care provider. Make sure you discuss any questions you have with your health care provider. Document Revised: 10/18/2018 Document Reviewed: 07/21/2017 Elsevier Patient Education  [place].    Heart Failure, Diagnosis  Heart failure is a condition in which the heart has trouble pumping blood because it has become weak or stiff. This means that the heart does not pump blood well enough for the body to stay healthy. For some people with heart failure, fluid may back up into the lungs. There may also be swelling (edema) in the lower legs. Heart failure is usually a long-term (chronic) condition. It is important for you to take good care of yourself and follow the treatment plan from your health care provider. What are the causes? This condition may be caused by:  High blood pressure (hypertension). Hypertension causes the heart muscle to work harder than normal. This makes the heart stiff or weak.  Coronary artery disease, or CAD. CAD is the buildup of cholesterol and fat (plaque) in the arteries of the heart.  Heart attack, also called myocardial infarction. This injures the heart muscle, making it hard for the heart to pump blood.  Abnormal heart valves. The valves do not open and close properly, forcing the heart to pump harder to keep the blood flowing.  Heart muscle disease (cardiomyopathy or myocarditis). This is damage to the heart muscle. It can increase the risk of heart failure.  Lung disease. The heart works harder when the lungs are not healthy.  Abnormal heart rhythms. These can lead to heart failure. What increases the risk? The risk of heart failure increases as a person ages. This condition  is also more likely to develop in people who:  Are overweight.  Are female.  Smoke or chew tobacco.  Abuse alcohol or illegal drugs.  Have taken medicines that can damage the heart, such as chemotherapy drugs.  Have diabetes.  Have abnormal heart rhythms.  Have thyroid problems.  Have low blood counts (anemia). What are the signs or symptoms? Symptoms of this condition include:  Shortness of breath with activity, such as when climbing stairs.  A cough that does not go away.  Swelling of the feet, ankles, legs, or abdomen.  Losing weight for no reason.  Trouble breathing when lying flat (orthopnea).  Waking from sleep because of the need to sit up and get more air.  Rapid heartbeat.  Tiredness (fatigue) and loss of energy.  Feeling light-headed, dizzy, or close to fainting.  Loss of appetite.  Nausea.  Waking up more often during the night to urinate (nocturia).  Confusion. How is this diagnosed? This condition is diagnosed based on:  Your medical history, symptoms, and a physical exam.  Diagnostic tests, which may include: ? Echocardiogram. ? Electrocardiogram (ECG). ? Chest X-ray. ? Blood tests. ? Exercise stress test. ? Radionuclide scans. ? Cardiac catheterization and angiogram. How is this treated? Treatment for this condition is aimed at managing  the symptoms of heart failure. Medicines Treatment may include medicines that:  Help lower blood pressure by relaxing (dilating) the blood vessels. These medicines are called ACE inhibitors (angiotensin-converting enzyme) and ARBs (angiotensin receptor blockers).  Cause the kidneys to remove salt and water from the blood through urination (diuretics).  Improve heart muscle strength and prevent the heart from beating too fast (beta blockers).  Increase the force of the heartbeat (digoxin). Healthy behavior changes     Treatment may also include making healthy lifestyle changes, such  as:  Reaching and staying at a healthy weight.  Quitting smoking or chewing tobacco.  Eating heart-healthy foods.  Limiting or avoiding alcohol.  Stopping the use of illegal drugs.  Being physically active.  Other treatments Other treatments may include:  Procedures to open blocked arteries or repair damaged valves.  Placing a pacemaker to improve heart function (cardiac resynchronization therapy).  Placing a device to treat serious abnormal heart rhythms (implantable cardioverter defibrillator, or ICD).  Placing a device to improve the pumping ability of the heart (left ventricular assist device, or LVAD).  Receiving a healthy heart from a donor (heart transplant). This is done when other treatments have not helped. Follow these instructions at home:  Manage other health conditions as told by your health care provider. These may include hypertension, diabetes, thyroid disease, or abnormal heart rhythms.  Get ongoing education and support as needed. Learn as much as you can about heart failure.  Keep all follow-up visits as told by your health care provider. This is important. Summary  Heart failure is a condition in which the heart has trouble pumping blood because it has become weak or stiff.  This condition is caused by high blood pressure and other diseases of the heart and lungs.  Symptoms of this condition include shortness of breath, tiredness (fatigue), nausea, and swelling of the feet, ankles, legs, or abdomen.  Treatments for this condition may include medicines, lifestyle changes, and surgery.  Manage other health conditions as told by your health care provider. This information is not intended to replace advice given to you by your health care provider. Make sure you discuss any questions you have with your health care provider. Document Revised: 09/15/2018 Document Reviewed: 09/15/2018 Elsevier Patient Education  [place].

## 2019-11-07 NOTE — Consult Note (Addendum)
ANTICOAGULATION CONSULT NOTE - Initial Consult  Pharmacy Consult for Warfarin Dosing Indication: atrial fibrillation  Allergies  Allergen Reactions  . Alendronate Other (See Comments)    Reaction: unknown  . Doxycycline Swelling    Swelling and hemmorage     . Effexor [Venlafaxine] Other (See Comments)    Body ached   . Ferralet [Iron-Folic [id number]] Nausea And Vomiting    Vomiting   . Ferrous Sulfate Nausea And Vomiting  . Penicillins Swelling    Per pt: swelling and hemmorage     . Prozac [Fluoxetine] Other (See Comments)    Reaction: unknown  . Simvastatin Other (See Comments)    Muscle pain   . Sulfa Antibiotics Other (See Comments)    Reaction: unknown  . Zoloft [Sertraline Hcl] Other (See Comments)    Reaction: unknown    Patient Measurements: Height: 5\' 3"  (160 cm) Weight: 76.5 kg (168 lb 10.4 oz) IBW/kg (Calculated) : 52.4 Heparin Dosing Weight: 67.9 kg  Vital Signs: Temp: 97.9 F (36.6 C) (04/28 0003) Temp Source: Oral (04/28 0003) BP: 95/60 (04/28 0003) Pulse Rate: 88 (04/28 0003)  Labs: Recent Labs    11/04/19 1119 11/04/19 1256 11/04/19 1256 11/05/19 0521 11/06/19 0535 11/07/19 0548  HGB 9.9*  --    < > 9.1*  --  9.2*  HCT 32.0*  --   --  30.2*  --  29.0*  PLT 196  --   --  181  --  228  LABPROT  --  22.0*   < > 22.5* 25.2* 29.7*  INR  --  1.9*   < > 2.0* 2.3* 2.9*  CREATININE 1.80*  --   --  1.89*  --  2.68*  TROPONINIHS 24* 26*  --   --   --   --    < > = values in this interval not displayed.    Estimated Creatinine Clearance: 16.1 mL/min (A) (by C-G formula based on SCr of 2.68 mg/dL (H)).   Medical History: Past Medical History:  Diagnosis Date  . CAD (coronary artery disease)   . CHF (congestive heart failure) ([place])   . COPD (chronic obstructive pulmonary disease) ([place])   . Diabetes ([place])   . HLD (hyperlipidemia)   . HTN (hypertension)   . PAF (paroxysmal atrial fibrillation) ([facility])     Medications:   Medications Prior to Admission  Medication Sig Dispense Refill Last Dose  . acetaminophen (TYLENOL) 325 MG tablet Take 325 mg by mouth every 6 (six) hours as needed. Takes with Tramadol   prn at prn  . albuterol (ACCUNEB) 1.25 MG/3ML nebulizer solution Inhale 1 ampule into the lungs every 4 (four) hours as needed for wheezing or shortness of breath.    prn at prn  . albuterol (PROVENTIL HFA;VENTOLIN HFA) 108 (90 Base) MCG/ACT inhaler Inhale 2 puffs into the lungs every 4 (four) hours as needed for wheezing or shortness of breath.    prn at prn  . amiodarone (PACERONE) 200 MG tablet Take 200 mg by mouth daily.  1 11/04/2019 at 0800  . b complex vitamins tablet Take 1 tablet by mouth daily.   11/04/2019 at 0800  . calcium carbonate (OSCAL) 1500 (600 Ca) MG TABS tablet Take 600 mg of elemental calcium by mouth daily with breakfast.   11/03/2019 at 0800  . cetirizine (ZYRTEC) 10 MG tablet Take 10 mg by mouth every other day.    11/04/2019 at 0800  . Cholecalciferol (VITAMIN D3) 5000 units TABS Take 1 tablet  by mouth daily.   11/03/2019 at Unknown time  . furosemide (LASIX) 20 MG tablet Take 1 tablet (20 mg total) by mouth 2 (two) times daily. (Patient taking differently: Take 20 mg by mouth 2 (two) times daily as needed. Only takes if gained 2 pounds) 60 tablet 0 prn at prn  . Melatonin 1 MG TABS Take 1 mg by mouth at bedtime as needed (sleep).    11/03/2019 at Unknown time  . Multiple Vitamins-Minerals (MULTIVITAMIN GUMMIES WOMENS) CHEW Chew 1 tablet by mouth daily.   11/03/2019 at Unknown time  . olmesartan-hydrochlorothiazide (BENICAR HCT) 40-25 MG tablet Take 1 tablet by mouth daily.  0 11/04/2019 at 0800  . traMADol (ULTRAM) 50 MG tablet Take 50-100 mg by mouth every 4 (four) hours as needed for moderate pain. Takes with Warfarin  1 11/04/2019 at 0800  . warfarin (COUMADIN) 2 MG tablet Take 1.5-2 mg by mouth daily. 2 mg on Wednesday, Saturday, 1.5 mg all other days  0 11/03/2019 at Unknown time    Scheduled:  . amiodarone  200 mg Oral Daily  . budesonide (PULMICORT) nebulizer solution  0.25 mg Nebulization BID  . furosemide  20 mg Intravenous Daily  . ipratropium  0.5 mg Nebulization Q6H  . levalbuterol  1.25 mg Nebulization Q6H  . methylPREDNISolone (SOLU-MEDROL) injection  40 mg Intravenous Q12H  . pantoprazole  40 mg Oral Daily  . ranolazine  500 mg Oral BID  . Warfarin - Pharmacist Dosing Inpatient   Does not apply q1600   Infusions:   PRN: acetaminophen **OR** acetaminophen, bisacodyl, meclizine, morphine injection, ondansetron **OR** ondansetron (ZOFRAN) IV, traMADol Anti-infectives (From admission, onward)   None      Assessment: Pharmacy has been consulted for Warfarin dosing in 84yo patient with history of atrial fibrillation. Patient also take Amiodarone PTA that will be continued in-patient. Last home dose taken 4/24.  Home regimen: 1.5mg  on Wednesdays and Saturdays. 2mg  on all other days.    INR Warfarin Dose Given 4/25:  1.9 2.0mg  4/26: 2.0 2.0mg  4/27: 2.3 2.0mg  4/28: 2.9   Scr 1.80--->2.68  Goal of Therapy:  INR 2-3 Monitor platelets by anticoagulation protocol: Yes   Plan:  INR 2.9 Therapeutic. Will order Warfarin 1.0 mg PO x 1 dose to be given this evening (slightly below home dose for Wednesday due to bump in Scr and INR trend).   Will order daily INRs and continue to monitor patient's H&H.  Lu Duffel, PharmD, BCPS Clinical Pharmacist 11/07/2019 7:18 AM

## 2019-11-07 NOTE — Progress Notes (Signed)
PT FOR DISCHARGE HOME. [facility] DCD.  ASSISTED TO DRESS FOR HOME  PER NT.  DTR GAIL HERE TO PICK PT  UP. INSTRUCTIONS PER BETH Doctor, hospital. FOR DISCHARGE.

## 2019-11-07 NOTE — Discharge Summary (Signed)
Physician Discharge Summary  Ashley Bird ESP:[id number] DOB: [birth year] DOA: 11/04/2019  PCP: Maryland Pink, MD  Admit date: 11/04/2019 Discharge date: 11/07/2019  Admitted From: Home Disposition: Home  Recommendations for Outpatient Follow-up:  1. Has appointment to follow-up with PCP tomorrow.  Please monitor renal function as outpatient.  (Her home Benicar dose has been held). 2. Needs to see a cardiologist (Dr. Ubaldo Glassing in 2-3 weeks.   Home Health: None Equipment/Devices: Has rolling walker at home  Discharge Condition: Fair CODE STATUS: DNR Diet recommendation: Heart Healthy     Discharge Diagnoses:  Principal Problem:   COPD with acute exacerbation ([place])  Active Problems: Chronic atrial fibrillation (HCC)   Generalized weakness   Acute renal failure superimposed on stage 3b chronic kidney disease (HCC)   Acute on chronic diastolic CHF (congestive heart failure) (HCC)   Angina pectoris (HCC) Prolonged QTC  Brief narrative/HPI 84 year old female with history of COPD not on home O2, CAD, chronic A. fib on Coumadin and amiodarone, hypertension, chronic kidney disease stage IIIb (baseline creatinine around 1.5-1.9) presented with increasing shortness of breath for past few days with difficulty to ambulate.  Also reported having some chest discomfort and palpitations. Patient admitted with COPD with acute exacerbation and acute on chronic diastolic CHF.  Hospital course  Acute respiratory failure with hypoxia (HCC) Secondary to COPD with acute exacerbation and acute on chronic diastolic CHF. Received IV Solu-Medrol, nebs, supplemental oxygen with clinical improvement. Weaned off oxygen and stable on room air.  Prescribed 5 days of oral prednisone and resume home inhalers.  Acute on chronic diastolic CHF. Placed on scheduled daily Lasix 20 mg twice daily and now euvolemic.  Had bilateral pleural effusion on chest x-ray on admission.  Clinically appears much improved and  maintaining sats on room air.  Will discharge on oral Lasix 20 mg daily.  (Her renal function worsened today so did not escalate the dose).  Patient taking Lasix only as needed at home.  BNP mildly elevated from 1 year ago.  Continue statin.  Follow-up with her cardiologist Dr. Ubaldo Glassing as outpatient.  Stable angina versus GERD -Infrequent substernal chest discomfort worsened with exertion.  Nonradiating symptoms.  Without findings of ischemia.  Troponin mildly elevated on admission but remained flat. Was started on Ranexa but discontinued due to worsening renal function today.  Will prescribe as nitroglycerin as needed.  Not on beta-blocker due to COPD.  Continue PPI. She should follow-up with her cardiologist for outpatient work-up and management.  Acute kidney injury superimposed on chronic kidney disease stage IIIb Baseline creatinine around 1.5-1.9.  Worsened to 2.68 today likely due to IV diuretic.  Patient will be discharged on scheduled Lasix 20 mg daily.  Will hold her Benicar upon discharge.  Needs renal function checked during outpatient follow-up.  (Has appointment with her PCP tomorrow).  Chronic A. fib Rate controlled.  Continue amiodarone.  Not on beta-blocker.  Continue Coumadin.  INR therapeutic.  Prolonged QTC (514) Monitor as outpatient while on amiodarone.  Avoid other QT prolonging agents.  Disposition: Dyspnea much improved.  Had some chest discomfort this morning when ambulating with PE with resolved without intervention.  EKG done with no new changes.  Stable to be discharged home with outpatient follow-up.  Family communication: Daughter at bedside     Discharge Instructions  Discharge Instructions    Diet - low sodium heart healthy   Complete by: As directed    Discharge instructions   Complete by: As directed    F/u  PCP in 1-2 weeks; F/u pulmon in 1 week   Increase activity slowly   Complete by: As directed      Allergies as of 11/07/2019      Reactions    Alendronate Other (See Comments)   Reaction: unknown   Doxycycline Swelling   Swelling and hemmorage      Effexor [venlafaxine] Other (See Comments)   Body ached   Ferralet [iron-folic [id number]] Nausea And Vomiting   Vomiting   Ferrous Sulfate Nausea And Vomiting   Penicillins Swelling   Per pt: swelling and hemmorage    Prozac [fluoxetine] Other (See Comments)   Reaction: unknown   Simvastatin Other (See Comments)   Muscle pain   Sulfa Antibiotics Other (See Comments)   Reaction: unknown   Zoloft [sertraline Hcl] Other (See Comments)   Reaction: unknown      Medication List    STOP taking these medications   olmesartan-hydrochlorothiazide 40-25 MG tablet Commonly known as: BENICAR HCT     TAKE these medications   acetaminophen 325 MG tablet Commonly known as: TYLENOL Take 325 mg by mouth every 6 (six) hours as needed. Takes with Tramadol   albuterol 1.25 MG/3ML nebulizer solution Commonly known as: ACCUNEB Inhale 1 ampule into the lungs every 4 (four) hours as needed for wheezing or shortness of breath.   albuterol 108 (90 Base) MCG/ACT inhaler Commonly known as: VENTOLIN HFA Inhale 2 puffs into the lungs every 4 (four) hours as needed for wheezing or shortness of breath.   amiodarone 200 MG tablet Commonly known as: PACERONE Take 200 mg by mouth daily.   b complex vitamins tablet Take 1 tablet by mouth daily.   calcium carbonate 1500 (600 Ca) MG Tabs tablet Commonly known as: OSCAL Take 600 mg of elemental calcium by mouth daily with breakfast.   cetirizine 10 MG tablet Commonly known as: ZYRTEC Take 10 mg by mouth every other day.   furosemide 20 MG tablet Commonly known as: LASIX Take 1 tablet (20 mg total) by mouth daily. What changed: when to take this   melatonin 1 MG Tabs tablet Take 1 mg by mouth at bedtime as needed (sleep).   Multivitamin Gummies Womens [employer] 1 tablet by mouth daily.   nitroGLYCERIN 0.4 MG SL  tablet Commonly known as: Nitrostat Place 1 tablet (0.4 mg total) under the tongue every 5 (five) minutes as needed for chest pain.   pantoprazole 40 MG tablet Commonly known as: Protonix Take 1 tablet (40 mg total) by mouth daily for 14 days.   predniSONE 20 MG tablet Commonly known as: Deltasone Take 2 tablets (40 mg total) by mouth daily for 5 days.   traMADol 50 MG tablet Commonly known as: ULTRAM Take 50-100 mg by mouth every 4 (four) hours as needed for moderate pain. Takes with Warfarin   Vitamin D3 125 MCG (5000 UT) Tabs Take 1 tablet by mouth daily.   warfarin 2 MG tablet Commonly known as: COUMADIN Take 1.5-2 mg by mouth daily. 2 mg on Wednesday, Saturday, 1.5 mg all other days      Follow-up Information    Maryland Pink, MD On 11/08/2019.   Specialty: Family Medicine Why: @ 10:30 am Contact information: [address] [place] [address] [phone]        Erby Pian, MD On 11/12/2019.   Specialty: Specialist Why: @ 11:00 am Contact information: [place] [address] [phone]  Teodoro Spray, MD Follow up in 2 week(s).   Specialty: Cardiology Contact information: [address] [place] [address] [phone]          Allergies  Allergen Reactions  . Alendronate Other (See Comments)    Reaction: unknown  . Doxycycline Swelling    Swelling and hemmorage     . Effexor [Venlafaxine] Other (See Comments)    Body ached   . Ferralet [Iron-Folic [id number]] Nausea And Vomiting    Vomiting   . Ferrous Sulfate Nausea And Vomiting  . Penicillins Swelling    Per pt: swelling and hemmorage     . Prozac [Fluoxetine] Other (See Comments)    Reaction: unknown  . Simvastatin Other (See Comments)    Muscle pain   . Sulfa Antibiotics Other (See Comments)    Reaction: unknown  . Zoloft [Sertraline Hcl] Other (See Comments)    Reaction: unknown       Procedures/Studies: DG Chest 2 View  Result Date: 11/04/2019 CLINICAL DATA:  Chest pain. EXAM: CHEST - 2 VIEW COMPARISON:  03/22/2019 FINDINGS: The heart is borderline enlarged. Moderate tortuosity and calcification of the thoracic aorta. There are small bilateral pleural effusions and overlying bibasilar atelectasis. No edema or infiltrates or pneumothorax. The bony thorax is intact. IMPRESSION: Small bilateral pleural effusions and overlying bibasilar atelectasis. Electronically Signed   By: Marijo Sanes M.D.   On: 11/04/2019 12:31   DG Chest Port 1 View  Result Date: 11/05/2019 CLINICAL DATA:  Shortness of breath. EXAM: PORTABLE CHEST 1 VIEW COMPARISON:  Frontal and lateral views yesterday. FINDINGS: Stable cardiomegaly. Aortic atherosclerosis. Increase in right pleural effusion from yesterday. Left pleural effusion may be slightly improved. Associated bibasilar opacities. Peribronchial thickening which is increasing. Possible retrocardiac hiatal hernia. No pneumothorax. No acute osseous abnormalities are seen. IMPRESSION: 1. Increasing right pleural effusion. Left pleural effusion may be slightly improved. Associated bibasilar opacities favor atelectasis. 2. Increasing peribronchial thickening which may be congestive or bronchitic. 3. Stable cardiomegaly. Aortic Atherosclerosis (ICD10-I70.0). Electronically Signed   By: Keith Rake M.D.   On: 11/05/2019 21:30       Subjective: Seen and examined.  Complained of substernal chest discomfort  Discharge Exam: Vitals:   11/07/19 0745 11/07/19 0814  BP: 102/70   Pulse: 91 92  Resp: 18 16  Temp: 97.8 F (36.6 C)   SpO2: 96% 93%   Vitals:   11/07/19 0003 11/07/19 0500 11/07/19 0745 11/07/19 0814  BP: 95/60  102/70   Pulse: 88  91 92  Resp: 18  18 16   Temp: 97.9 F (36.6 C)  97.8 F (36.6 C)   TempSrc: Oral  Oral   SpO2: 90%  96% 93%  Weight:  76.5 kg    Height:        General: Elderly female, fatigued, not in  distress HEENT: Moist mucosa, supple neck Chest: Clear to auscultation bilaterally CVs: S1-S2 regular, no murmurs rubs or gallop GI: Soft, nondistended, nontender,  Musculoskeletal: Warm, no edema     The results of significant diagnostics from this hospitalization (including imaging, microbiology, ancillary and laboratory) are listed below for reference.     Microbiology: Recent Results (from the past 240 hour(s))  Respiratory Panel by RT PCR (Flu A&B, Covid) - Nasopharyngeal Swab     Status: None   Collection Time: 11/04/19  7:54 PM   Specimen: Nasopharyngeal Swab  Result Value Ref Range Status   SARS Coronavirus 2 by RT PCR NEGATIVE NEGATIVE Final  Comment: (NOTE) SARS-CoV-2 target nucleic acids are NOT DETECTED. The SARS-CoV-2 RNA is generally detectable in upper respiratoy specimens during the acute phase of infection. The lowest concentration of SARS-CoV-2 viral copies this assay can detect is 131 copies/mL. A negative result does not preclude SARS-Cov-2 infection and should not be used as the sole basis for treatment or other patient management decisions. A negative result may occur with  improper specimen collection/handling, submission of specimen other than nasopharyngeal swab, presence of viral mutation(s) within the areas targeted by this assay, and inadequate number of viral copies (<131 copies/mL). A negative result must be combined with clinical observations, patient history, and epidemiological information. The expected result is Negative. Fact Sheet for Patients:  PinkCheek.be Fact Sheet for Healthcare Providers:  GravelBags.it This test is not yet ap proved or cleared by the [place] FDA and  has been authorized for detection and/or diagnosis of SARS-CoV-2 by FDA under an Emergency Use Authorization (EUA). This EUA will remain  in effect (meaning this test can be used) for the duration of  the COVID-19 declaration under Section 564(b)(1) of the Act, 21 U.S.C. section 360bbb-3(b)(1), unless the authorization is terminated or revoked sooner.    Influenza A by PCR NEGATIVE NEGATIVE Final   Influenza B by PCR NEGATIVE NEGATIVE Final    Comment: (NOTE) The Xpert Xpress SARS-CoV-2/FLU/RSV assay is intended as an aid in  the diagnosis of influenza from Nasopharyngeal swab specimens and  should not be used as a sole basis for treatment. Nasal washings and  aspirates are unacceptable for Xpert Xpress SARS-CoV-2/FLU/RSV  testing. Fact Sheet for Patients: PinkCheek.be Fact Sheet for Healthcare Providers: GravelBags.it This test is not yet approved or cleared by the [place] FDA and  has been authorized for detection and/or diagnosis of SARS-CoV-2 by  FDA under an Emergency Use Authorization (EUA). This EUA will remain  in effect (meaning this test can be used) for the duration of the  Covid-19 declaration under Section 564(b)(1) of the Act, 21  U.S.C. section 360bbb-3(b)(1), unless the authorization is  terminated or revoked. Performed at [facility], [address] [place],  [address]      Labs: BNP (last 3 results) Recent Labs    11/04/19 1119  BNP 6,606.3*   Basic Metabolic Panel: Recent Labs  Lab 11/04/19 1119 11/05/19 0521 11/07/19 0548  NA 137 139 134*  K 4.4 4.3 5.0  CL 102 103 98  CO2 27 27 25   GLUCOSE 125* 213* 188*  BUN 27* 37* 68*  CREATININE 1.80* 1.89* 2.68*  CALCIUM 9.3 9.3 9.4  MG  --  2.0  --    Liver Function Tests: Recent Labs  Lab 11/05/19 0521  AST 17  ALT 14  ALKPHOS 60  BILITOT 0.6  PROT 6.2*  ALBUMIN 3.1*   No results for input(s): LIPASE, AMYLASE in the last 168 hours. No results for input(s): AMMONIA in the last 168 hours. CBC: Recent Labs  Lab 11/04/19 1119 11/05/19 0521 11/07/19 0548  WBC 9.0 4.2 8.8  HGB 9.9* 9.1* 9.2*  HCT 32.0*  30.2* 29.0*  MCV 90.4 91.5 88.1  PLT 196 181 228   Cardiac Enzymes: No results for input(s): CKTOTAL, CKMB, CKMBINDEX, TROPONINI in the last 168 hours. BNP: Invalid input(s): POCBNP CBG: Recent Labs  Lab 11/04/19 2159 11/06/19 2358  GLUCAP 184* 191*   D-Dimer No results for input(s): DDIMER in the last 72 hours. Hgb A1c No results for input(s): HGBA1C in the last 72 hours.  Lipid Profile No results for input(s): CHOL, HDL, LDLCALC, TRIG, CHOLHDL, LDLDIRECT in the last 72 hours. Thyroid function studies No results for input(s): TSH, T4TOTAL, T3FREE, THYROIDAB in the last 72 hours.  Invalid input(s): FREET3 Anemia work up No results for input(s): VITAMINB12, FOLATE, FERRITIN, TIBC, IRON, RETICCTPCT in the last 72 hours. Urinalysis    Component Value Date/Time   COLORURINE STRAW (A) 05/01/2018 2329   APPEARANCEUR HAZY (A) 05/01/2018 2329   LABSPEC 1.006 05/01/2018 2329   PHURINE 5.0 05/01/2018 2329   GLUCOSEU NEGATIVE 05/01/2018 2329   HGBUR SMALL (A) 05/01/2018 2329   BILIRUBINUR NEGATIVE 05/01/2018 2329   KETONESUR NEGATIVE 05/01/2018 2329   PROTEINUR NEGATIVE 05/01/2018 2329   NITRITE NEGATIVE 05/01/2018 2329   LEUKOCYTESUR NEGATIVE 05/01/2018 2329   Sepsis Labs Invalid input(s): PROCALCITONIN,  WBC,  LACTICIDVEN Microbiology Recent Results (from the past 240 hour(s))  Respiratory Panel by RT PCR (Flu A&B, Covid) - Nasopharyngeal Swab     Status: None   Collection Time: 11/04/19  7:54 PM   Specimen: Nasopharyngeal Swab  Result Value Ref Range Status   SARS Coronavirus 2 by RT PCR NEGATIVE NEGATIVE Final    Comment: (NOTE) SARS-CoV-2 target nucleic acids are NOT DETECTED. The SARS-CoV-2 RNA is generally detectable in upper respiratoy specimens during the acute phase of infection. The lowest concentration of SARS-CoV-2 viral copies this assay can detect is 131 copies/mL. A negative result does not preclude SARS-Cov-2 infection and should not be used as the sole  basis for treatment or other patient management decisions. A negative result may occur with  improper specimen collection/handling, submission of specimen other than nasopharyngeal swab, presence of viral mutation(s) within the areas targeted by this assay, and inadequate number of viral copies (<131 copies/mL). A negative result must be combined with clinical observations, patient history, and epidemiological information. The expected result is Negative. Fact Sheet for Patients:  PinkCheek.be Fact Sheet for Healthcare Providers:  GravelBags.it This test is not yet ap proved or cleared by the [place] FDA and  has been authorized for detection and/or diagnosis of SARS-CoV-2 by FDA under an Emergency Use Authorization (EUA). This EUA will remain  in effect (meaning this test can be used) for the duration of the COVID-19 declaration under Section 564(b)(1) of the Act, 21 U.S.C. section 360bbb-3(b)(1), unless the authorization is terminated or revoked sooner.    Influenza A by PCR NEGATIVE NEGATIVE Final   Influenza B by PCR NEGATIVE NEGATIVE Final    Comment: (NOTE) The Xpert Xpress SARS-CoV-2/FLU/RSV assay is intended as an aid in  the diagnosis of influenza from Nasopharyngeal swab specimens and  should not be used as a sole basis for treatment. Nasal washings and  aspirates are unacceptable for Xpert Xpress SARS-CoV-2/FLU/RSV  testing. Fact Sheet for Patients: PinkCheek.be Fact Sheet for Healthcare Providers: GravelBags.it This test is not yet approved or cleared by the [place] FDA and  has been authorized for detection and/or diagnosis of SARS-CoV-2 by  FDA under an Emergency Use Authorization (EUA). This EUA will remain  in effect (meaning this test can be used) for the duration of the  Covid-19 declaration under Section 564(b)(1) of the Act, 21  U.S.C.  section 360bbb-3(b)(1), unless the authorization is  terminated or revoked. Performed at [facility], [address], [place], [place] [address]      Time coordinating discharge: 35 minutes  SIGNED:   Louellen Molder, MD  [facility] 11/07/2019, 1:21 PM Pager   If 7PM-7AM, please contact  night-coverage [email or web address] Password [email or web address]

## 2019-11-22 ENCOUNTER — Encounter: Payer: Self-pay | Admitting: Emergency Medicine

## 2019-11-22 ENCOUNTER — Emergency Department: Payer: Medicare Other

## 2019-11-22 ENCOUNTER — Inpatient Hospital Stay
Admission: EM | Admit: 2019-11-22 | Discharge: 2019-12-11 | DRG: 871 | Disposition: E | Payer: Medicare Other | Attending: Internal Medicine | Admitting: Internal Medicine

## 2019-11-22 ENCOUNTER — Other Ambulatory Visit: Payer: Self-pay

## 2019-11-22 DIAGNOSIS — E1122 Type 2 diabetes mellitus with diabetic chronic kidney disease: Secondary | ICD-10-CM | POA: Diagnosis present

## 2019-11-22 DIAGNOSIS — K810 Acute cholecystitis: Secondary | ICD-10-CM | POA: Diagnosis present

## 2019-11-22 DIAGNOSIS — E785 Hyperlipidemia, unspecified: Secondary | ICD-10-CM | POA: Diagnosis present

## 2019-11-22 DIAGNOSIS — E872 Acidosis: Secondary | ICD-10-CM | POA: Diagnosis present

## 2019-11-22 DIAGNOSIS — E871 Hypo-osmolality and hyponatremia: Secondary | ICD-10-CM | POA: Diagnosis present

## 2019-11-22 DIAGNOSIS — Z87891 Personal history of nicotine dependence: Secondary | ICD-10-CM

## 2019-11-22 DIAGNOSIS — I130 Hypertensive heart and chronic kidney disease with heart failure and stage 1 through stage 4 chronic kidney disease, or unspecified chronic kidney disease: Secondary | ICD-10-CM | POA: Diagnosis present

## 2019-11-22 DIAGNOSIS — E875 Hyperkalemia: Secondary | ICD-10-CM | POA: Diagnosis present

## 2019-11-22 DIAGNOSIS — Z881 Allergy status to other antibiotic agents status: Secondary | ICD-10-CM

## 2019-11-22 DIAGNOSIS — I480 Paroxysmal atrial fibrillation: Secondary | ICD-10-CM | POA: Diagnosis present

## 2019-11-22 DIAGNOSIS — Z882 Allergy status to sulfonamides status: Secondary | ICD-10-CM

## 2019-11-22 DIAGNOSIS — E860 Dehydration: Secondary | ICD-10-CM | POA: Diagnosis present

## 2019-11-22 DIAGNOSIS — N179 Acute kidney failure, unspecified: Secondary | ICD-10-CM | POA: Diagnosis not present

## 2019-11-22 DIAGNOSIS — N170 Acute kidney failure with tubular necrosis: Secondary | ICD-10-CM | POA: Diagnosis present

## 2019-11-22 DIAGNOSIS — Z515 Encounter for palliative care: Secondary | ICD-10-CM | POA: Diagnosis present

## 2019-11-22 DIAGNOSIS — Z888 Allergy status to other drugs, medicaments and biological substances status: Secondary | ICD-10-CM

## 2019-11-22 DIAGNOSIS — R6520 Severe sepsis without septic shock: Secondary | ICD-10-CM

## 2019-11-22 DIAGNOSIS — J449 Chronic obstructive pulmonary disease, unspecified: Secondary | ICD-10-CM | POA: Diagnosis present

## 2019-11-22 DIAGNOSIS — Z800 Family history of malignant neoplasm of digestive organs: Secondary | ICD-10-CM | POA: Diagnosis not present

## 2019-11-22 DIAGNOSIS — R6521 Severe sepsis with septic shock: Secondary | ICD-10-CM | POA: Diagnosis present

## 2019-11-22 DIAGNOSIS — I2510 Atherosclerotic heart disease of native coronary artery without angina pectoris: Secondary | ICD-10-CM | POA: Diagnosis present

## 2019-11-22 DIAGNOSIS — E861 Hypovolemia: Secondary | ICD-10-CM | POA: Diagnosis present

## 2019-11-22 DIAGNOSIS — R1110 Vomiting, unspecified: Secondary | ICD-10-CM | POA: Diagnosis not present

## 2019-11-22 DIAGNOSIS — Z7901 Long term (current) use of anticoagulants: Secondary | ICD-10-CM

## 2019-11-22 DIAGNOSIS — R791 Abnormal coagulation profile: Secondary | ICD-10-CM | POA: Diagnosis present

## 2019-11-22 DIAGNOSIS — K819 Cholecystitis, unspecified: Secondary | ICD-10-CM

## 2019-11-22 DIAGNOSIS — A419 Sepsis, unspecified organism: Secondary | ICD-10-CM | POA: Diagnosis present

## 2019-11-22 DIAGNOSIS — N2581 Secondary hyperparathyroidism of renal origin: Secondary | ICD-10-CM | POA: Diagnosis present

## 2019-11-22 DIAGNOSIS — N184 Chronic kidney disease, stage 4 (severe): Secondary | ICD-10-CM | POA: Diagnosis present

## 2019-11-22 DIAGNOSIS — K8000 Calculus of gallbladder with acute cholecystitis without obstruction: Secondary | ICD-10-CM | POA: Diagnosis present

## 2019-11-22 DIAGNOSIS — Z880 Allergy status to penicillin: Secondary | ICD-10-CM

## 2019-11-22 DIAGNOSIS — I5032 Chronic diastolic (congestive) heart failure: Secondary | ICD-10-CM | POA: Diagnosis present

## 2019-11-22 DIAGNOSIS — E878 Other disorders of electrolyte and fluid balance, not elsewhere classified: Secondary | ICD-10-CM | POA: Diagnosis present

## 2019-11-22 DIAGNOSIS — E119 Type 2 diabetes mellitus without complications: Secondary | ICD-10-CM

## 2019-11-22 DIAGNOSIS — D631 Anemia in chronic kidney disease: Secondary | ICD-10-CM | POA: Diagnosis present

## 2019-11-22 DIAGNOSIS — Z66 Do not resuscitate: Secondary | ICD-10-CM | POA: Diagnosis present

## 2019-11-22 DIAGNOSIS — R112 Nausea with vomiting, unspecified: Secondary | ICD-10-CM

## 2019-11-22 DIAGNOSIS — Z833 Family history of diabetes mellitus: Secondary | ICD-10-CM | POA: Diagnosis not present

## 2019-11-22 DIAGNOSIS — Z825 Family history of asthma and other chronic lower respiratory diseases: Secondary | ICD-10-CM

## 2019-11-22 LAB — COMPREHENSIVE METABOLIC PANEL
ALT: 148 U/L — ABNORMAL HIGH (ref 0–44)
AST: 144 U/L — ABNORMAL HIGH (ref 15–41)
Albumin: 2.9 g/dL — ABNORMAL LOW (ref 3.5–5.0)
Alkaline Phosphatase: 78 U/L (ref 38–126)
Anion gap: 18 — ABNORMAL HIGH (ref 5–15)
BUN: 70 mg/dL — ABNORMAL HIGH (ref 8–23)
CO2: 17 mmol/L — ABNORMAL LOW (ref 22–32)
Calcium: 9 mg/dL (ref 8.9–10.3)
Chloride: 95 mmol/L — ABNORMAL LOW (ref 98–111)
Creatinine, Ser: 3.98 mg/dL — ABNORMAL HIGH (ref 0.44–1.00)
GFR calc Af Amer: 12 mL/min — ABNORMAL LOW (ref >60)
GFR calc non Af Amer: 10 mL/min — ABNORMAL LOW (ref >60)
Glucose, Bld: 168 mg/dL — ABNORMAL HIGH (ref 70–99)
Potassium: 5.3 mmol/L — ABNORMAL HIGH (ref 3.5–5.1)
Sodium: 130 mmol/L — ABNORMAL LOW (ref 135–145)
Total Bilirubin: 1.5 mg/dL — ABNORMAL HIGH (ref 0.3–1.2)
Total Protein: 6.1 g/dL — ABNORMAL LOW (ref 6.5–8.1)

## 2019-11-22 LAB — CBC
HCT: 34.2 % — ABNORMAL LOW (ref 36.0–46.0)
Hemoglobin: 10.2 g/dL — ABNORMAL LOW (ref 12.0–15.0)
MCH: 26.8 pg (ref 26.0–34.0)
MCHC: 29.8 g/dL — ABNORMAL LOW (ref 30.0–36.0)
MCV: 90 fL (ref 80.0–100.0)
Platelets: 217 10*3/uL (ref 150–400)
RBC: 3.8 MIL/uL — ABNORMAL LOW (ref 3.87–5.11)
RDW: 14.9 % (ref 11.5–15.5)
WBC: 19 10*3/uL — ABNORMAL HIGH (ref 4.0–10.5)
nRBC: 0.3 % — ABNORMAL HIGH (ref 0.0–0.2)

## 2019-11-22 LAB — PROTIME-INR
INR: 3.5 — ABNORMAL HIGH (ref 0.8–1.2)
Prothrombin Time: 33.7 seconds — ABNORMAL HIGH (ref 11.4–15.2)

## 2019-11-22 LAB — LIPASE, BLOOD: Lipase: 23 U/L (ref 11–51)

## 2019-11-22 LAB — HEMOGLOBIN A1C
Hgb A1c MFr Bld: 6.5 % — ABNORMAL HIGH (ref 4.8–5.6)
Mean Plasma Glucose: 139.85 mg/dL

## 2019-11-22 LAB — LACTIC ACID, PLASMA: Lactic Acid, Venous: >11 mmol/L (ref 0.5–1.9)

## 2019-11-22 MED ORDER — CALCIUM GLUCONATE 10 % IV SOLN
INTRAVENOUS | Status: AC
  Filled 2019-11-22: qty 10

## 2019-11-22 MED ORDER — SODIUM CHLORIDE 0.45 % IV SOLN: INTRAVENOUS | Status: DC

## 2019-11-22 MED ORDER — LEVOFLOXACIN IN D5W 750 MG/150ML IV SOLN: 750.0000 mg | Freq: Once | INTRAVENOUS | Status: DC

## 2019-11-22 MED ORDER — SODIUM CHLORIDE 0.9% FLUSH: 3.0000 mL | Freq: Once | INTRAVENOUS | Status: DC

## 2019-11-22 MED ORDER — PANTOPRAZOLE SODIUM 40 MG IV SOLR: 40.0000 mg | INTRAVENOUS | Status: DC

## 2019-11-22 MED ORDER — SODIUM CHLORIDE 0.9 % IV BOLUS
1000.0000 mL | Freq: Once | INTRAVENOUS | Status: AC
  Administered 2019-11-22: 1000 mL via INTRAVENOUS

## 2019-11-22 MED ORDER — METRONIDAZOLE IN NACL 5-0.79 MG/ML-% IV SOLN
500.0000 mg | Freq: Once | INTRAVENOUS | Status: AC
  Administered 2019-11-22: 500 mg via INTRAVENOUS
  Filled 2019-11-22: qty 100

## 2019-11-22 MED ORDER — ONDANSETRON HCL 4 MG/2ML IJ SOLN: 4.0000 mg | Freq: Four times a day (QID) | INTRAMUSCULAR | Status: DC | PRN

## 2019-11-22 MED ORDER — SODIUM CHLORIDE 0.9 % IV BOLUS
1500.0000 mL | Freq: Once | INTRAVENOUS | Status: AC
  Administered 2019-11-22: 1500 mL via INTRAVENOUS

## 2019-11-22 MED ORDER — MORPHINE SULFATE (PF) 2 MG/ML IV SOLN
2.0000 mg | Freq: Once | INTRAVENOUS | Status: AC
  Administered 2019-11-22: 2 mg via INTRAVENOUS
  Filled 2019-11-22: qty 1

## 2019-11-22 MED ORDER — ONDANSETRON HCL 4 MG PO TABS: 4.0000 mg | ORAL_TABLET | Freq: Four times a day (QID) | ORAL | Status: DC | PRN

## 2019-11-22 MED ORDER — SODIUM CHLORIDE 0.9 % IV SOLN: 1.0000 g | INTRAVENOUS | Status: DC

## 2019-11-22 MED ORDER — MORPHINE SULFATE (PF) 2 MG/ML IV SOLN: 2.0000 mg | INTRAVENOUS | Status: DC | PRN

## 2019-11-22 MED ORDER — INSULIN ASPART 100 UNIT/ML ~~LOC~~ SOLN: 0.0000 [IU] | SUBCUTANEOUS | Status: DC

## 2019-11-22 MED ORDER — SODIUM BICARBONATE 8.4 % IV SOLN
INTRAVENOUS | Status: AC
  Filled 2019-11-22: qty 50

## 2019-11-22 MED ORDER — LORAZEPAM 2 MG/ML IJ SOLN
2.0000 mg | INTRAVENOUS | Status: DC | PRN
  Filled 2019-11-22: qty 1

## 2019-11-22 MED ORDER — SODIUM CHLORIDE 0.9 % IV BOLUS: 1000.0000 mL | Freq: Once | INTRAVENOUS | Status: DC

## 2019-11-22 MED ORDER — MORPHINE SULFATE (PF) 4 MG/ML IV SOLN
4.0000 mg | Freq: Once | INTRAVENOUS | Status: AC
  Administered 2019-11-22: 4 mg via INTRAVENOUS
  Filled 2019-11-22: qty 1

## 2019-11-22 MED ORDER — FENTANYL CITRATE (PF) 100 MCG/2ML IJ SOLN
50.0000 ug | Freq: Once | INTRAMUSCULAR | Status: AC
  Administered 2019-11-22: 50 ug via INTRAVENOUS
  Filled 2019-11-22: qty 2

## 2019-11-22 MED ORDER — SODIUM CHLORIDE 0.9 % IV SOLN
2.0000 g | Freq: Once | INTRAVENOUS | Status: AC
  Administered 2019-11-22: 2 g via INTRAVENOUS
  Filled 2019-11-22: qty 20

## 2019-11-22 MED ORDER — ONDANSETRON HCL 4 MG/2ML IJ SOLN
4.0000 mg | Freq: Once | INTRAMUSCULAR | Status: AC
  Administered 2019-11-22: 4 mg via INTRAVENOUS
  Filled 2019-11-22: qty 2

## 2019-11-26 ENCOUNTER — Other Ambulatory Visit: Payer: Medicare Other | Admitting: Nurse Practitioner

## 2019-11-27 LAB — CULTURE, BLOOD (ROUTINE X 2)
Culture: NO GROWTH
Culture: NO GROWTH
Special Requests: ADEQUATE

## 2019-12-11 NOTE — ED Notes (Signed)
Dr. Archie Balboa alerted to patient changed of condition, to bedside to assess patient.

## 2019-12-11 NOTE — ED Notes (Signed)
Unable to collect blood cultures x 2 and lactic, pt difficult IV stick.

## 2019-12-11 NOTE — ED Notes (Signed)
Unable to establish IV access at this time. IV team consult ordered.

## 2019-12-11 NOTE — ED Notes (Signed)
Pt returned from [place] called lab to collect blood cultures/bloodwork.

## 2019-12-11 NOTE — Progress Notes (Signed)
Patient is admitted to the hospital for septic shock with acute organ dysfunction.  Patient's condition declined after admission and she became lethargic and hypotensive.  Discussed with multiple family members at the bedside who did not want any further aggressive measures and would like patient to be kept comfortable.  They understand her overall prognosis is poor and are in agreement for comfort measures at this time. They also understand that active treatment will be discontinued which include IV fluids and antibiotic therapy and patient will only receive IV medications for pain control and Ativan as needed. Patient will be downgraded to medical floor

## 2019-12-11 NOTE — Consult Note (Signed)
[facility] SURGICAL CONSULTATION NOTE (initial) - cpt: 99244   HISTORY OF PRESENT ILLNESS (HPI):  84 y.o. female with a history of CAD, CHF, PAF on Warfarin, HTN, HLD, DM, and COPD who presented to [facility] ED today for evaluation of abdominal pain. Patient's daughter at bedside helps contribute to the history. Reportedly notice the acute onset of upper abdominal pain around Sunday or Monday of this week. Pain onset suddenly. This has been progressively worsening over the last few days. Now severe and more diffuse. She has associated decreased appetite and nausea. No fevers, chills, CP, SOB, nausea, juandice, urinary changes, or bowel changes. Daughter certain she has had gallbladder problems in the past. Previous abdominal surgeries include abdominal hysterectomy. Work up in the ED was concerning for leukocytosis to 19K, acute on chronic kidney injury with Scr 3.98 (was 2.6 on 04/28), hyperkalemia to 5.3, hyponatremia to 130, hyperbilirubinemia to 1.5, supratherapeutic INR to 3.5, lactic acidosis to >11. RUQ [place] concerning for acute cholecystitis.   Surgery is consulted by emergency medicine physician Dr. Derrell Lolling, MD in this context for evaluation and management of acute cholecystitis.   PAST MEDICAL HISTORY (PMH):  Past Medical History:  Diagnosis Date  . CAD (coronary artery disease)   . CHF (congestive heart failure) ([place])   . COPD (chronic obstructive pulmonary disease) ([place])   . Diabetes ([place])   . HLD (hyperlipidemia)   . HTN (hypertension)   . PAF (paroxysmal atrial fibrillation) ([place])      PAST SURGICAL HISTORY ([place]):  Past Surgical History:  Procedure Laterality Date  . ABDOMINAL HYSTERECTOMY    . BREAST EXCISIONAL BIOPSY Left 40 yrs ago   neg  . BREAST EXCISIONAL BIOPSY Left 40 yrs ago   neg     MEDICATIONS:  Prior to Admission medications   Medication Sig Start Date End Date Taking? Authorizing Provider  acetaminophen (TYLENOL) 325 MG tablet Take 325 mg  by mouth every 6 (six) hours as needed. Takes with Tramadol    [provider]  albuterol (ACCUNEB) 1.25 MG/3ML nebulizer solution Inhale 1 ampule into the lungs every 4 (four) hours as needed for wheezing or shortness of breath.  05/11/18 11/04/19  [provider]  albuterol (PROVENTIL HFA;VENTOLIN HFA) 108 (90 Base) MCG/ACT inhaler Inhale 2 puffs into the lungs every 4 (four) hours as needed for wheezing or shortness of breath.  05/11/18 11/04/19  [provider]  amiodarone (PACERONE) 200 MG tablet Take 200 mg by mouth daily. 03/03/18   [provider]  b complex vitamins tablet Take 1 tablet by mouth daily.    [provider]  calcium carbonate (OSCAL) 1500 (600 Ca) MG TABS tablet Take 600 mg of elemental calcium by mouth daily with breakfast.    [provider]  cetirizine (ZYRTEC) 10 MG tablet Take 10 mg by mouth every other day.     [provider]  Cholecalciferol (VITAMIN D3) 5000 units TABS Take 1 tablet by mouth daily.    [provider]  furosemide (LASIX) 20 MG tablet Take 1 tablet (20 mg total) by mouth daily. 11/07/19 12/07/19  Dhungel, Flonnie Overman, MD  Melatonin 1 MG TABS Take 1 mg by mouth at bedtime as needed (sleep).     [provider]  Multiple Vitamins-Minerals (MULTIVITAMIN GUMMIES WOMENS) CHEW Chew 1 tablet by mouth daily.    [provider]  nitroGLYCERIN (NITROSTAT) 0.4 MG SL tablet Place 1 tablet (0.4 mg total) under the tongue every 5 (five) minutes as needed  for chest pain. 11/07/19 12/07/19  Dhungel, Flonnie Overman, MD  pantoprazole (PROTONIX) 40 MG tablet Take 1 tablet (40 mg total) by mouth daily for 14 days. 11/07/19 11/21/19  Dhungel, Flonnie Overman, MD  traMADol (ULTRAM) 50 MG tablet Take 50-100 mg by mouth every 4 (four) hours as needed for moderate pain. Takes with Warfarin 04/03/18   [provider]  warfarin (COUMADIN) 2 MG tablet Take 1.5-2 mg by mouth daily. 2 mg on Wednesday, Saturday, 1.5  mg all other days 03/02/18   [provider]     ALLERGIES:  Allergies  Allergen Reactions  . Alendronate Other (See Comments)    Reaction: unknown  . Doxycycline Swelling    Swelling and hemmorage     . Effexor [Venlafaxine] Other (See Comments)    Body ached   . Ferralet [Iron-Folic [id number]] Nausea And Vomiting    Vomiting   . Ferrous Sulfate Nausea And Vomiting  . Penicillins Swelling    Per pt: swelling and hemmorage     . Prozac [Fluoxetine] Other (See Comments)    Reaction: unknown  . Simvastatin Other (See Comments)    Muscle pain   . Sulfa Antibiotics Other (See Comments)    Reaction: unknown  . Zoloft [Sertraline Hcl] Other (See Comments)    Reaction: unknown     SOCIAL HISTORY:  Social History   Socioeconomic History  . Marital status: Widowed    Spouse name: Not on file  . Number of children: Not on file  . Years of education: Not on file  . Highest education level: Not on file  Occupational History  . Not on file  Tobacco Use  . Smoking status: Former Smoker    Packs/day: 2.00    Years: 30.00    Pack years: 60.00    Types: Cigarettes    Quit date: 05/1979    Years since quitting: 40.5  . Smokeless tobacco: Never Used  Substance and Sexual Activity  . Alcohol use: Not Currently  . Drug use: Never  . Sexual activity: Not Currently  Other Topics Concern  . Not on file  Social History Narrative  . Not on file   Social Determinants of Health   Financial Resource Strain:   . Difficulty of Paying Living Expenses:   Food Insecurity:   . Worried About Charity fundraiser in the Last Year:   . Arboriculturist in the Last Year:   Transportation Needs:   . Film/video editor (Medical):   Marland Kitchen Lack of Transportation (Non-Medical):   Physical Activity:   . Days of Exercise per Week:   . Minutes of Exercise per Session:   Stress:   . Feeling of Stress :   Social Connections:   . Frequency of Communication with Friends and  Family:   . Frequency of Social Gatherings with Friends and Family:   . Attends Religious Services:   . Active Member of Clubs or Organizations:   . Attends Archivist Meetings:   Marland Kitchen Marital Status:   Intimate Partner Violence:   . Fear of Current or Ex-Partner:   . Emotionally Abused:   Marland Kitchen Physically Abused:   . Sexually Abused:      FAMILY HISTORY:  Family History  Problem Relation Age of Onset  . Colon cancer Mother   . Diabetes Mother   . Asthma Father       REVIEW OF SYSTEMS:  Review of Systems  Constitutional: Negative for chills and fever.       +  Decreased Appetite  HENT: Negative for congestion and sore throat.   Respiratory: Negative for cough and shortness of breath.   Cardiovascular: Negative for chest pain and palpitations.  Gastrointestinal: Positive for abdominal pain and nausea. Negative for blood in stool, constipation, diarrhea and vomiting.  Genitourinary: Negative for dysuria and urgency.  All other systems reviewed and are negative.   VITAL SIGNS:  Temp:  [97.4 F (36.3 C)] 97.4 F (36.3 C) (05/13 1057) Pulse Rate:  [96] 96 (05/13 1057) Resp:  [16] 16 (05/13 1057) BP: (125)/(80) 125/80 (05/13 1057) SpO2:  [100 %] 100 % (05/13 1057) Weight:  [73.5 kg] 73.5 kg (05/13 1055)     Height: 5\' 3"  (160 cm) Weight: 73.5 kg BMI (Calculated): 28.7   INTAKE/OUTPUT:  No intake/output data recorded.  PHYSICAL EXAM:  Physical Exam Vitals and nursing note reviewed.  Constitutional:      General: She is not in acute distress.    Appearance: She is obese. She is ill-appearing.     Comments: Very ill appearing female  HENT:     Head: Normocephalic and atraumatic.  Eyes:     General: No scleral icterus.    Conjunctiva/sclera: Conjunctivae normal.  Cardiovascular:     Rate and Rhythm: Normal rate. Rhythm irregular.     Pulses: Normal pulses.     Heart sounds: No murmur.  Pulmonary:     Effort: Pulmonary effort is normal. No respiratory distress.      Breath sounds: Normal breath sounds.     Comments: [place] Abdominal:     General: Abdomen is flat. There is no distension.     Palpations: Abdomen is soft.     Tenderness: There is generalized abdominal tenderness. There is no guarding or rebound. Negative signs include Murphy's sign.     Comments: Abdomen is diffusely tender, worse in RUQ, unable to appreciate Murphy's Sign, no rebound.guarding  Genitourinary:    Comments: Deferred Musculoskeletal:        General: Normal range of motion.     Right lower leg: No edema.     Left lower leg: No edema.  Skin:    General: Skin is warm and dry.     Coloration: Skin is not jaundiced.  Neurological:     General: No focal deficit present.     Mental Status: She is alert.     Comments: Alert       Labs:  CBC Latest Ref Rng & Units 12-10-19 11/07/2019 11/05/2019  WBC 4.0 - 10.5 K/uL 19.0(H) 8.8 4.2  Hemoglobin 12.0 - 15.0 g/dL 10.2(L) 9.2(L) 9.1(L)  Hematocrit 36.0 - 46.0 % 34.2(L) 29.0(L) 30.2(L)  Platelets 150 - 400 K/uL 217 228 181   CMP Latest Ref Rng & Units 12-10-19 11/07/2019 11/05/2019  Glucose 70 - 99 mg/dL 168(H) 188(H) 213(H)  BUN 8 - 23 mg/dL 70(H) 68(H) 37(H)  Creatinine 0.44 - 1.00 mg/dL 3.98(H) 2.68(H) 1.89(H)  Sodium 135 - 145 mmol/L 130(L) 134(L) 139  Potassium 3.5 - 5.1 mmol/L 5.3(H) 5.0 4.3  Chloride 98 - 111 mmol/L 95(L) 98 103  CO2 22 - 32 mmol/L 17(L) 25 27  Calcium 8.9 - 10.3 mg/dL 9.0 9.4 9.3  Total Protein 6.5 - 8.1 g/dL 6.1(L) - 6.2(L)  Total Bilirubin 0.3 - 1.2 mg/dL 1.5(H) - 0.6  Alkaline Phos 38 - 126 U/L 78 - 60  AST 15 - 41 U/L 144(H) - 17  ALT 0 - 44 U/L 148(H) - 14     Imaging studies:  RUQ [place] (11-29-19) personally reviewed concerning for acute cholecystitis, and radiologist report reviewed:  IMPRESSION: 1. Sonographic findings compatible with acute calculus cholecystitis. 2. Right pleural effusion. 3. The appearance of the liver suggests underlying cirrhosis. Correlate with liver  function tests.   Assessment/Plan: (ICD-10's: K81.0) 84 y.o. female with sepsis, acute on chronic kidney injury, electrolyte derangement, and lactic acidosis likely from cholecystitis with CT pending to rule out any additional intra-abdominal source, complicated by a multitude of comorbid conditions including PAF on Warfarin.    - Appreciate medicine admission; plan for ICU admission  - Will get STAT CT Abdomen/Pelvis  - She is very sick and given her current condition and comorbid conditions she is a poor surgical candidate, we recommend IR placement of percutaneous cholecystostomy tube   - Will need to reverse INR; monitor  - Continue aggressive IVF resuscitation; monitor UO; assess need for foley  - Continue Aggressive IV Abx  - Trend leukocytosis; renal function; electrolytes; lactic acidosis  - If her bilirubin continues to increase she may require MRCP at some point this admission  - Further management per primary service; we will closely follow  All of the above findings and recommendations were discussed with the patient and her daughter at bedside, and all of the patient's family's questions were answered to their expressed satisfaction.  Thank you for the opportunity to participate in this patient's care.   -- Edison Simon, PA-C [facility] 11-29-19, 3:20 PM [phone] M-F: 7am - 4pm

## 2019-12-11 NOTE — ED Notes (Signed)
Pt trx to [place]

## 2019-12-11 NOTE — H&P (Addendum)
History and Physical    Ashley Bird PZW:[id number] DOB: 03/01/1936 DOA: 12/21/2019  PCP: Maryland Pink, MD   Patient coming from: Home  I have personally briefly reviewed patient's old medical records in [place]  Chief Complaint: Abdominal pain HPI: Ashley Bird is a 84 y.o. female with medical history significant for paroxysmal A. fib on chronic anticoagulation therapy with Coumadin, diastolic CHF, COPD and coronary artery disease who presents to the emergency room for evaluation of abdominal pain, nausea and vomiting.  Symptoms have been going on for about 3 days and have progressively worsened.  Patient is unable to tolerate anything by mouth.  Pain is located in the right upper quadrant and is described as a dull sensation which she rates an 8 x 10 in intensity at its worst.  Abdominal pain is associated with multiple episodes of emesis and persistent nausea.  She denies having any fever or chills. According to her daughter who is at the bedside she has episodes of abdominal pain with some meals and they knew she had gallbladder disease but patient was hesitant to get her gallbladder taken out. She denies having any chest pain, shortness of breath, urinary frequency, nocturia, headache or changes in her bowel habits. Patient's labs reveal an acute kidney injury, serum creatinine increased from 1.892 weeks ago to 3.98, transaminitis, hyperkalemia,  serum bicarbonate level of 17 and lactate of 11.  She has a white cell count of 19K with a left shift. Patient had a gallbladder ultrasound which showed findings compatible with acute calculus cholecystitis.  ED Course: Patient is seen in the emergency room for evaluation of abdominal pain.  Right upper quadrant ultrasound is compatible with acute calculus cholecystitis.  Appearance of the liver suggest underlying cirrhosis.  Patient is septic and has a lactate level of 11.  She received IV Rocephin and Flagyl as well as sepsis IV fluid  bolus.  Review of Systems: As per HPI otherwise 10 point review of systems negative.    Past Medical History:  Diagnosis Date  . CAD (coronary artery disease)   . CHF (congestive heart failure) ([place])   . COPD (chronic obstructive pulmonary disease) ([place])   . Diabetes ([place])   . HLD (hyperlipidemia)   . HTN (hypertension)   . PAF (paroxysmal atrial fibrillation) ([place])     Past Surgical History:  Procedure Laterality Date  . ABDOMINAL HYSTERECTOMY    . BREAST EXCISIONAL BIOPSY Left 40 yrs ago   neg  . BREAST EXCISIONAL BIOPSY Left 40 yrs ago   neg     reports that she quit smoking about 40 years ago. Her smoking use included cigarettes. She has a 60.00 pack-year smoking history. She has never used smokeless tobacco. She reports previous alcohol use. She reports that she does not use drugs.  Allergies  Allergen Reactions  . Alendronate Other (See Comments)    Reaction: unknown  . Doxycycline Swelling    Swelling and hemmorage     . Effexor [Venlafaxine] Other (See Comments)    Body ached   . Ferralet [Iron-Folic [id number]] Nausea And Vomiting    Vomiting   . Ferrous Sulfate Nausea And Vomiting  . Penicillins Swelling    Per pt: swelling and hemmorage     . Prozac [Fluoxetine] Other (See Comments)    Reaction: unknown  . Simvastatin Other (See Comments)    Muscle pain   . Sulfa Antibiotics Other (See Comments)    Reaction: unknown  . Zoloft [Sertraline Hcl]  Other (See Comments)    Reaction: unknown    Family History  Problem Relation Age of Onset  . Colon cancer Mother   . Diabetes Mother   . Asthma Father      Prior to Admission medications   Medication Sig Start Date End Date Taking? Authorizing Provider  acetaminophen (TYLENOL) 325 MG tablet Take 325 mg by mouth every 6 (six) hours as needed. Takes with Tramadol    [provider]  albuterol (ACCUNEB) 1.25 MG/3ML nebulizer solution Inhale 1 ampule into the lungs every 4 (four) hours  as needed for wheezing or shortness of breath.  05/11/18 11/04/19  [provider]  albuterol (PROVENTIL HFA;VENTOLIN HFA) 108 (90 Base) MCG/ACT inhaler Inhale 2 puffs into the lungs every 4 (four) hours as needed for wheezing or shortness of breath.  05/11/18 11/04/19  [provider]  amiodarone (PACERONE) 200 MG tablet Take 200 mg by mouth daily. 03/03/18   [provider]  b complex vitamins tablet Take 1 tablet by mouth daily.    [provider]  calcium carbonate (OSCAL) 1500 (600 Ca) MG TABS tablet Take 600 mg of elemental calcium by mouth daily with breakfast.    [provider]  cetirizine (ZYRTEC) 10 MG tablet Take 10 mg by mouth every other day.     [provider]  Cholecalciferol (VITAMIN D3) 5000 units TABS Take 1 tablet by mouth daily.    [provider]  furosemide (LASIX) 20 MG tablet Take 1 tablet (20 mg total) by mouth daily. 11/07/19 12/07/19  Dhungel, Flonnie Overman, MD  Melatonin 1 MG TABS Take 1 mg by mouth at bedtime as needed (sleep).     [provider]  Multiple Vitamins-Minerals (MULTIVITAMIN GUMMIES WOMENS) CHEW Chew 1 tablet by mouth daily.    [provider]  nitroGLYCERIN (NITROSTAT) 0.4 MG SL tablet Place 1 tablet (0.4 mg total) under the tongue every 5 (five) minutes as needed for chest pain. 11/07/19 12/07/19  Dhungel, Flonnie Overman, MD  pantoprazole (PROTONIX) 40 MG tablet Take 1 tablet (40 mg total) by mouth daily for 14 days. 11/07/19 11/21/19  Dhungel, Flonnie Overman, MD  traMADol (ULTRAM) 50 MG tablet Take 50-100 mg by mouth every 4 (four) hours as needed for moderate pain. Takes with Warfarin 04/03/18   [provider]  warfarin (COUMADIN) 2 MG tablet Take 1.5-2 mg by mouth daily. 2 mg on Wednesday, Saturday, 1.5 mg all other days 03/02/18   [provider]    Physical Exam: Vitals:   2019-11-25 1055 11-25-2019 1057  BP:  125/80  Pulse:  96  Resp:  16  Temp:  (!) 97.4 F (36.3 C)  TempSrc:   Oral  SpO2:  100%  Weight: 73.5 kg   Height: 5\' 3"  (1.6 m)      Vitals:   25-Nov-2019 1055 11/25/2019 1057  BP:  125/80  Pulse:  96  Resp:  16  Temp:  (!) 97.4 F (36.3 C)  TempSrc:  Oral  SpO2:  100%  Weight: 73.5 kg   Height: 5\' 3"  (1.6 m)     Constitutional: Acutely ill-appearing, lethargic but arouses to verbal stimuli Eyes: PERRL, lids and conjunctivae pale ENMT: Mucous membranes are dry  Neck: normal, supple, no masses, no thyromegaly Respiratory: clear to auscultation bilaterally, no wheezing, no crackles. Normal respiratory effort. No accessory muscle use.  Cardiovascular: Regular rate and rhythm, no murmurs / rubs / gallops. No extremity edema. 2+ pedal pulses. No carotid bruits.  Abdomen: tenderness RUQ,  no masses palpated. No hepatosplenomegaly. Bowel sounds positive.  Musculoskeletal: no clubbing / cyanosis. No joint deformity upper and lower extremities.  Skin: no rashes, lesions, ulcers.  Neurologic: No gross focal neurologic deficit. Psychiatric: Normal mood and affect.   Labs on Admission: I have personally reviewed following labs and imaging studies  CBC: Recent Labs  Lab 11-29-2019 1104  WBC 19.0*  HGB 10.2*  HCT 34.2*  MCV 90.0  PLT 160   Basic Metabolic Panel: Recent Labs  Lab 2019/11/29 1104  NA 130*  K 5.3*  CL 95*  CO2 17*  GLUCOSE 168*  BUN 70*  CREATININE 3.98*  CALCIUM 9.0   GFR: Estimated Creatinine Clearance: 10.6 mL/min (A) (by C-G formula based on SCr of 3.98 mg/dL (H)). Liver Function Tests: Recent Labs  Lab Nov 29, 2019 1104  AST 144*  ALT 148*  ALKPHOS 78  BILITOT 1.5*  PROT 6.1*  ALBUMIN 2.9*   Recent Labs  Lab 11/29/19 1104  LIPASE 23   No results for input(s): AMMONIA in the last 168 hours. Coagulation Profile: Recent Labs  Lab 11-29-2019 1418  INR 3.5*   Cardiac Enzymes: No results for input(s): CKTOTAL, CKMB, CKMBINDEX, TROPONINI in the last 168 hours. BNP (last 3 results) No results for input(s): PROBNP in  the last 8760 hours. HbA1C: No results for input(s): HGBA1C in the last 72 hours. CBG: No results for input(s): GLUCAP in the last 168 hours. Lipid Profile: No results for input(s): CHOL, HDL, LDLCALC, TRIG, CHOLHDL, LDLDIRECT in the last 72 hours. Thyroid Function Tests: No results for input(s): TSH, T4TOTAL, FREET4, T3FREE, THYROIDAB in the last 72 hours. Anemia Panel: No results for input(s): VITAMINB12, FOLATE, FERRITIN, TIBC, IRON, RETICCTPCT in the last 72 hours. Urine analysis:    Component Value Date/Time   COLORURINE STRAW (A) 05/01/2018 2329   APPEARANCEUR HAZY (A) 05/01/2018 2329   LABSPEC 1.006 05/01/2018 2329   PHURINE 5.0 05/01/2018 2329   GLUCOSEU NEGATIVE 05/01/2018 2329   HGBUR SMALL (A) 05/01/2018 2329   BILIRUBINUR NEGATIVE 05/01/2018 2329   KETONESUR NEGATIVE 05/01/2018 2329   PROTEINUR NEGATIVE 05/01/2018 2329   NITRITE NEGATIVE 05/01/2018 2329   LEUKOCYTESUR NEGATIVE 05/01/2018 2329    Radiological Exams on Admission: US ABDOMEN LIMITED RUQ  Result Date: 11/29/19 CLINICAL DATA:  Right upper quadrant pain EXAM: ULTRASOUND ABDOMEN LIMITED RIGHT UPPER QUADRANT COMPARISON:  None. FINDINGS: Gallbladder: Mildly distended gallbladder with multiple hyperdense, shadowing stones. Largest stone measures up to 7 mm. Pericholecystic edema with wall thickening measuring up to 9 mm. Sonographer reports a positive Murphy sign. Common bile duct: Diameter: 5 mm Liver: No focal lesion identified. Echogenic hepatic parenchyma with a subtly nodular surface contour. Portal vein is patent on color Doppler imaging with normal direction of blood flow towards the liver. Other: Visualized right kidney demonstrates mild cortical thinning with increased parenchymal echogenicity. A small cyst is noted at the upper pole. A right pleural effusion is partially visualized. IMPRESSION: 1. Sonographic findings compatible with acute calculus cholecystitis. 2. Right pleural effusion. 3. The  appearance of the liver suggests underlying cirrhosis. Correlate with liver function tests. Electronically Signed   By: Davina Poke D.O.   On: Nov 29, 2019 14:43    EKG: Independently reviewed.   Assessment/Plan Principal Problem:   Severe sepsis (HCC) Active Problems:   Diabetes (HCC)   PAF (paroxysmal atrial fibrillation) (HCC)   Chronic diastolic heart failure (HCC)   Acute cholecystitis   AKI (acute kidney injury) ([place])     Severe sepsis (POA) with  acute organ dysfunction (AKI) Etiology appears to be from acute cholecystitis Patient presents with a 3-day history of nausea, vomiting and right upper quadrant pain Patient has a lactate of greater than 11 She has transaminitis and right upper quadrant ultrasound shows acute calculus cholecystitis Aggressive IV fluid resuscitation Place patient on empiric antibiotic therapy with Invanz for gram-negative and anaerobes Consult surgery   Acute on chronic kidney injury Prerenal and secondary to poor oral intake as well as GI fluid loss from nausea and vomiting At baseline patient has a serum creatinine of 1.89 and today on admission it is 3.98 Patient also has an anion gap metabolic acidosis as well as mild hyperkalemia Hold Furosemide We will place patient on a bicarbonate infusion Consult nephrology Keep MAP > 34mmHg   Paroxysmal atrial fibrillation Patient is on anticoagulation with Coumadin and INR is supra therapeutic Hold Coumadin for now Monitor daily PT/INR Hold amiodarone    Diabetes mellitus Keep patient n.p.o. for now Glycemic control with sliding scale coverage   Chronic diastolic dysfunction CHF Not acutely exacerbated Hold Lasix due to acute kidney injury Monitor closely for signs of fluid overload   DVT prophylaxis: SCD Code Status: DO NOT RESUSCITATE Family Communication: Greater than 50% of time was spent discussing patient's condition and plan of care with her and her daughter at the bedside.   They verbalized understanding and agree with the plan.  CODE STATUS was discussed and she is a DO NOT RESUSCITATE Disposition Plan: Back to previous home environment Consults called: Surgery, Nephrology    Philicia Heyne MD [facility]     11/30/19, 4:08 PM

## 2019-12-11 NOTE — ED Notes (Signed)
Attempted to call Intensivist, voice mail left.

## 2019-12-11 NOTE — ED Notes (Signed)
Dr. Francine Graven called and alerted to change of condition.  Increased work of breathing, widening QRS.

## 2019-12-11 NOTE — ED Triage Notes (Signed)
C/O abdominal pain, nausea x 3 days.  STates hasn't eaten much x 3 days.  22g saline line to left hand in place.  4 mg zofran given by EMS,  AAOx3.  Skin warm and dry. NAD

## 2019-12-11 NOTE — ED Notes (Signed)
Family members at bedside at this time.

## 2019-12-11 NOTE — ED Notes (Signed)
Provided pt w/ ice chips  

## 2019-12-11 NOTE — Death Summary Note (Signed)
Death Summary  Ashley Bird GQQ:[id number] DOB: 11-08-1935 DOA: 11-26-2019  PCP: Maryland Pink, MD  Admit date: 2019-11-26 Date of Death: 11-26-2019 Time of Death: 10-03-2022 Notification: Maryland Pink, MD notified of death of 11-27-19   History of present illness:  Ashley Bird is a 84 y.o. female with a history of paroxysmal atrial fibrillation on chronic anticoagulation therapy, chronic diastolic dysfunction CHF, COPD, chronic kidney disease and coronary artery disease. Ashley Bird presented with complaint of right upper quadrant abdominal pain associated with nausea and vomiting for 3 days.  She rated her pain an 8 x 10 in intensity at its worst and had been unable to tolerate any oral intake.  Imaging done in the emergency room showed acute calculus cholecystitis.   Patient was septic on admission and had a lactate level of 11, white cell count of 19,000, worsening renal function from her baseline of 1.89-3.98, hyperkalemia and anion gap metabolic acidosis. Her overall poor prognosis was discussed with her daughter in detail and CODE STATUS was discussed with patient who wanted to be placed anything resuscitate status. Patient was seen in consultation by surgery and nephrology. Ashley Bird did not improve after IV fluid resuscitation, antibiotic therapy and supportive care.  Patient continued to decline in the emergency room and became hypotensive requiring pressors and lethargic. After meeting with the family they decided to place patient on comfort measures.  All aggressive treatment measures were discontinued and patient was transitioned to comfort measures.  Time of death was  10.06pm on 11-26-2019(very brief description of intervention)   Final Diagnoses:  1.   Severe sepsis with shock  2.  Acute kidney injury 3.  Acute calculus cholecystitis 4.  Paroxysmal atrial fibrillation 5.  Diabetes mellitus 6.  COPD  The results of significant diagnostics from this hospitalization  (including imaging, microbiology, ancillary and laboratory) are listed below for reference.    Significant Diagnostic Studies: CT ABDOMEN PELVIS WO CONTRAST  Result Date: 11-26-19 CLINICAL DATA:  Abdominal pain and nausea for 3 days. EXAM: CT ABDOMEN AND PELVIS WITHOUT CONTRAST TECHNIQUE: Multidetector CT imaging of the abdomen and pelvis was performed following the standard protocol without IV contrast. COMPARISON:  Right upper quadrant ultrasound on 11/26/2018 FINDINGS: Lower chest: Small right pleural effusion. Bibasilar atelectasis. Hepatobiliary: No mass visualized on this unenhanced exam. Mild left hepatic lobe hypertrophy and capsular nodularity is suspicious for cirrhosis. The gallbladder contains high attenuation sludge and a tiny less than 1 cm calcified gallstone. No evidence of gallbladder wall thickening or pericholecystic inflammatory changes. No evidence of biliary ductal dilatation. Pancreas: No mass or inflammatory process visualized on this unenhanced exam. Spleen:  Within normal limits in size. Adrenals/Urinary tract: No evidence of urolithiasis or hydronephrosis. A few tiny fluid attenuation renal cysts are noted bilaterally. Unremarkable unopacified urinary bladder. Stomach/Bowel: Moderate hiatal hernia is seen. No evidence of obstruction, inflammatory process, or abnormal fluid collections. Diverticulosis is seen involving the descending and sigmoid colon. Mild soft tissue stranding is seen in the pericolonic fat adjacent to the descending proximal sigmoid colon, suspicious for mild diverticulitis. No evidence of extraluminal air or abscess. Vascular/Lymphatic: No pathologically enlarged lymph nodes identified. No evidence of abdominal aortic aneurysm. Aortic atherosclerosis noted. Reproductive: Prior hysterectomy noted. Adnexal regions are unremarkable in appearance. Other: Diffuse mesenteric and body wall edema noted. No evidence of ascites. Musculoskeletal:  No suspicious bone lesions  identified. IMPRESSION: 1. Suspect mild diverticulitis involving the descending and proximal sigmoid colon. No evidence of abscess or other complication.  2. Probable hepatic cirrhosis.  No evidence of ascites. 3. Diffuse mesenteric and body wall edema and small right pleural effusion. 4. Moderate hiatal hernia. 5. Cholelithiasis and gallbladder sludge. No radiographic evidence of cholecystitis. Aortic Atherosclerosis (ICD10-I70.0). Electronically Signed   By: Marlaine Hind M.D.   On: Nov 25, 2019 18:07   DG Chest 2 View  Result Date: 11/04/2019 CLINICAL DATA:  Chest pain. EXAM: CHEST - 2 VIEW COMPARISON:  03/22/2019 FINDINGS: The heart is borderline enlarged. Moderate tortuosity and calcification of the thoracic aorta. There are small bilateral pleural effusions and overlying bibasilar atelectasis. No edema or infiltrates or pneumothorax. The bony thorax is intact. IMPRESSION: Small bilateral pleural effusions and overlying bibasilar atelectasis. Electronically Signed   By: Marijo Sanes M.D.   On: 11/04/2019 12:31   DG Chest Port 1 View  Result Date: 11/05/2019 CLINICAL DATA:  Shortness of breath. EXAM: PORTABLE CHEST 1 VIEW COMPARISON:  Frontal and lateral views yesterday. FINDINGS: Stable cardiomegaly. Aortic atherosclerosis. Increase in right pleural effusion from yesterday. Left pleural effusion may be slightly improved. Associated bibasilar opacities. Peribronchial thickening which is increasing. Possible retrocardiac hiatal hernia. No pneumothorax. No acute osseous abnormalities are seen. IMPRESSION: 1. Increasing right pleural effusion. Left pleural effusion may be slightly improved. Associated bibasilar opacities favor atelectasis. 2. Increasing peribronchial thickening which may be congestive or bronchitic. 3. Stable cardiomegaly. Aortic Atherosclerosis (ICD10-I70.0). Electronically Signed   By: Keith Rake M.D.   On: 11/05/2019 21:30   US ABDOMEN LIMITED RUQ  Result Date: 11-25-19 CLINICAL  DATA:  Right upper quadrant pain EXAM: ULTRASOUND ABDOMEN LIMITED RIGHT UPPER QUADRANT COMPARISON:  None. FINDINGS: Gallbladder: Mildly distended gallbladder with multiple hyperdense, shadowing stones. Largest stone measures up to 7 mm. Pericholecystic edema with wall thickening measuring up to 9 mm. Sonographer reports a positive Murphy sign. Common bile duct: Diameter: 5 mm Liver: No focal lesion identified. Echogenic hepatic parenchyma with a subtly nodular surface contour. Portal vein is patent on color Doppler imaging with normal direction of blood flow towards the liver. Other: Visualized right kidney demonstrates mild cortical thinning with increased parenchymal echogenicity. A small cyst is noted at the upper pole. A right pleural effusion is partially visualized. IMPRESSION: 1. Sonographic findings compatible with acute calculus cholecystitis. 2. Right pleural effusion. 3. The appearance of the liver suggests underlying cirrhosis. Correlate with liver function tests. Electronically Signed   By: Davina Poke D.O.   On: Nov 25, 2019 14:43    Microbiology: No results found for this or any previous visit (from the past 240 hour(s)).   Labs: Basic Metabolic Panel: Recent Labs  Lab 25-Nov-2019 1104  NA 130*  K 5.3*  CL 95*  CO2 17*  GLUCOSE 168*  BUN 70*  CREATININE 3.98*  CALCIUM 9.0   Liver Function Tests: Recent Labs  Lab Nov 25, 2019 1104  AST 144*  ALT 148*  ALKPHOS 78  BILITOT 1.5*  PROT 6.1*  ALBUMIN 2.9*   Recent Labs  Lab Nov 25, 2019 1104  LIPASE 23   No results for input(s): AMMONIA in the last 168 hours. CBC: Recent Labs  Lab Nov 25, 2019 1104  WBC 19.0*  HGB 10.2*  HCT 34.2*  MCV 90.0  PLT 217   Cardiac Enzymes: No results for input(s): CKTOTAL, CKMB, CKMBINDEX, TROPONINI in the last 168 hours. D-Dimer No results for input(s): DDIMER in the last 72 hours. BNP: Invalid input(s): POCBNP CBG: No results for input(s): GLUCAP in the last 168 hours. Anemia work  up No results for input(s): VITAMINB12, FOLATE, FERRITIN, TIBC, IRON,  RETICCTPCT in the last 72 hours. Urinalysis    Component Value Date/Time   COLORURINE STRAW (A) 05/01/2018 2329   APPEARANCEUR HAZY (A) 05/01/2018 2329   LABSPEC 1.006 05/01/2018 2329   PHURINE 5.0 05/01/2018 2329   GLUCOSEU NEGATIVE 05/01/2018 2329   HGBUR SMALL (A) 05/01/2018 2329   BILIRUBINUR NEGATIVE 05/01/2018 2329   KETONESUR NEGATIVE 05/01/2018 2329   PROTEINUR NEGATIVE 05/01/2018 2329   NITRITE NEGATIVE 05/01/2018 2329   LEUKOCYTESUR NEGATIVE 05/01/2018 2329   Sepsis Labs Invalid input(s): PROCALCITONIN,  WBC,  LACTICIDVEN     SIGNED:  Collier Bullock, MD  [facility] 11/20/2019, 7:21 AM Pager   If 7PM-7AM, please contact night-coverage [email or web address] Password [email or web address]

## 2019-12-11 NOTE — Progress Notes (Signed)
PHARMACY -  BRIEF ANTIBIOTIC NOTE   Pharmacy has received consult(s) for levofloxacin from an ED provider. Patient also ordered metronidazole. The patient's profile has been reviewed for ht/wt/allergies/indication/available labs.   Patient has allergy to PCN (swelling / hemorrhage) in chart. She has tolerated cephalosporins in the past. Per consult, authority to change levofloxacin to ceftriaxone if documented history of cephalosporin use.  Will discontinue levofloxacin and order ceftriaxone  One time order(s) placed for -Ceftriaxone 2 g   Further antibiotics/pharmacy consults should be ordered by admitting physician if indicated.                       Thank you, [place] Resident 12-08-19  1:05 PM

## 2019-12-11 NOTE — ED Notes (Signed)
Loss of heart rhythm verified by ausculation and palpation of pulses by Karn Pickler RN and Asael Pann RN. Dr. Damita Dunnings made aware, pt does have a nurse may pronounce order. Family at bedside and is aware, chaplain paged.

## 2019-12-11 NOTE — ED Provider Notes (Signed)
[facility] Emergency Department Provider Note  ____________________________________________   First MD Initiated Contact with Patient 12-17-19 1249     (approximate)  I have reviewed the triage vital signs and the nursing notes.  History  Chief Complaint Vomiting   HPI Ashley Bird is a 84 y.o. female with a history of CAD, diastolic HF, COPD, paroxysmal AF on anticoagulation, who presents to the emergency department for abdominal pain, nausea, vomiting.  Symptoms have been present for the past 3 to 4 days, constant and progressively worsening.  She says she is not able to tolerate anything by mouth.  She reports generalized diffuse abdominal pain, but most locally in the right upper quadrant. Describes it as dull, currently 4/10 in severity. No alleviating/aggravating factors. Too many episodes of vomiting to count.  No blood in the vomit.  Denies any diarrhea or any black/bloody stools.  No known fevers.   Past Medical Hx Past Medical History:  Diagnosis Date  . CAD (coronary artery disease)   . CHF (congestive heart failure) ([place])   . COPD (chronic obstructive pulmonary disease) ([place])   . Diabetes ([place])   . HLD (hyperlipidemia)   . HTN (hypertension)   . PAF (paroxysmal atrial fibrillation) [facility])     Problem List Patient Active Problem List   Diagnosis Date Noted  . Acute renal failure superimposed on stage 3b chronic kidney disease ([place]) 11/07/2019  . Acute on chronic diastolic CHF (congestive heart failure) ([place]) 11/07/2019  . Angina pectoris ([place]) 11/07/2019  . Generalized weakness   . COPD exacerbation ([place]) 03/22/2019  . Palliative care encounter 11/28/2018  . Shortness of breath 11/28/2018  . Chronic diastolic heart failure ([place]) 05/22/2018  . Acute systolic CHF (congestive heart failure) ([place]) 05/01/2018  . CAP (community acquired pneumonia) 05/01/2018  . COPD with acute exacerbation ([place]) 05/01/2018  . Diabetes ([place]) 05/01/2018  .  HTN (hypertension) 05/01/2018  . HLD (hyperlipidemia) 05/01/2018  . PAF (paroxysmal atrial fibrillation) ([place]) 05/01/2018    Past Surgical Hx Past Surgical History:  Procedure Laterality Date  . ABDOMINAL HYSTERECTOMY    . BREAST EXCISIONAL BIOPSY Left 40 yrs ago   neg  . BREAST EXCISIONAL BIOPSY Left 40 yrs ago   neg    Medications Prior to Admission medications   Medication Sig Start Date End Date Taking? Authorizing Provider  acetaminophen (TYLENOL) 325 MG tablet Take 325 mg by mouth every 6 (six) hours as needed. Takes with Tramadol    [provider]  albuterol (ACCUNEB) 1.25 MG/3ML nebulizer solution Inhale 1 ampule into the lungs every 4 (four) hours as needed for wheezing or shortness of breath.  05/11/18 11/04/19  [provider]  albuterol (PROVENTIL HFA;VENTOLIN HFA) 108 (90 Base) MCG/ACT inhaler Inhale 2 puffs into the lungs every 4 (four) hours as needed for wheezing or shortness of breath.  05/11/18 11/04/19  [provider]  amiodarone (PACERONE) 200 MG tablet Take 200 mg by mouth daily. 03/03/18   [provider]  b complex vitamins tablet Take 1 tablet by mouth daily.    [provider]  calcium carbonate (OSCAL) 1500 (600 Ca) MG TABS tablet Take 600 mg of elemental calcium by mouth daily with breakfast.    [provider]  cetirizine (ZYRTEC) 10 MG tablet Take 10 mg by mouth every other day.     [provider]  Cholecalciferol (VITAMIN D3) 5000 units TABS Take 1 tablet by mouth daily.    [provider]  furosemide (  LASIX) 20 MG tablet Take 1 tablet (20 mg total) by mouth daily. 11/07/19 12/07/19  Dhungel, Flonnie Overman, MD  Melatonin 1 MG TABS Take 1 mg by mouth at bedtime as needed (sleep).     [provider]  Multiple Vitamins-Minerals (MULTIVITAMIN GUMMIES WOMENS) CHEW Chew 1 tablet by mouth daily.    [provider]  nitroGLYCERIN (NITROSTAT) 0.4 MG SL tablet Place 1 tablet (0.4 mg  total) under the tongue every 5 (five) minutes as needed for chest pain. 11/07/19 12/07/19  Dhungel, Flonnie Overman, MD  pantoprazole (PROTONIX) 40 MG tablet Take 1 tablet (40 mg total) by mouth daily for 14 days. 11/07/19 11/21/19  Dhungel, Flonnie Overman, MD  traMADol (ULTRAM) 50 MG tablet Take 50-100 mg by mouth every 4 (four) hours as needed for moderate pain. Takes with Warfarin 04/03/18   [provider]  warfarin (COUMADIN) 2 MG tablet Take 1.5-2 mg by mouth daily. 2 mg on Wednesday, Saturday, 1.5 mg all other days 03/02/18   [provider]    Allergies Alendronate, Doxycycline, Effexor [venlafaxine], Ferralet [iron-folic [id number]], Ferrous sulfate, Penicillins, Prozac [fluoxetine], Simvastatin, Sulfa antibiotics, and Zoloft [sertraline hcl]  Family Hx Family History  Problem Relation Age of Onset  . Colon cancer Mother   . Diabetes Mother   . Asthma Father     Social Hx Social History   Tobacco Use  . Smoking status: Former Smoker    Packs/day: 2.00    Years: 30.00    Pack years: 60.00    Types: Cigarettes    Quit date: 05/1979    Years since quitting: 40.5  . Smokeless tobacco: Never Used  Substance Use Topics  . Alcohol use: Not Currently  . Drug use: Never     Review of Systems  Constitutional: Negative for fever. Negative for chills. Eyes: Negative for visual changes. ENT: Negative for sore throat. Cardiovascular: Negative for chest pain. Respiratory: Negative for shortness of breath. Gastrointestinal: + vomiting, abdominal pain Genitourinary: Negative for dysuria. Musculoskeletal: Negative for leg swelling. Skin: Negative for rash. Neurological: Negative for headaches.   Physical Exam  Vital Signs: ED Triage Vitals  Enc Vitals Group     BP December 16, 2019 1057 125/80     Pulse Rate Dec 16, 2019 1057 96     Resp 12-16-2019 1057 16     Temp 12/16/19 1057 (!) 97.4 F (36.3 C)     Temp Source 12-16-19 1057 Oral     SpO2 16-Dec-2019 1057 100 %     Weight  Dec 16, 2019 1055 162 lb (73.5 kg)     Height Dec 16, 2019 1055 5\' 3"  (1.6 m)     Head Circumference --      Peak Flow --      Pain Score 12-16-19 1054 4     Pain Loc --      Pain Edu? --      Excl. in [place]? --     Constitutional: Alert and oriented. Looks uncomfortable. Emesis bag at bedside.  Head: Normocephalic. Atraumatic. Eyes: Conjunctivae clear. Sclera anicteric. Pupils equal and symmetric. Nose: No masses or lesions. No congestion or rhinorrhea. Mouth/Throat: MM dry.  Neck: No stridor. Trachea midline.  Cardiovascular: Normal rate. Extremities well perfused. Respiratory: Normal respiratory effort.  Lungs CTAB. Gastrointestinal: Soft. Non-distended. TTP across upper abdomen, focal tenderness in RUQ. Genitourinary: Deferred. Musculoskeletal: No lower extremity edema. No deformities. Neurologic:  Normal speech and language. No gross focal or lateralizing neurologic deficits are appreciated.  Skin: Skin is warm, dry and intact. No rash noted. Psychiatric:  Mood and affect are appropriate for situation.    Radiology  Personally reviewed available imaging myself.   RUQ US - IMPRESSION:  1. Sonographic findings compatible with acute calculus  cholecystitis.  2. Right pleural effusion.  3. The appearance of the liver suggests underlying cirrhosis.  Correlate with liver function tests.    Procedures  Procedure(s) performed (including critical care):  .Critical Care Performed by: Lilia Pro., MD Authorized by: Lilia Pro., MD   Critical care provider statement:    Critical care time (minutes):  35   Critical care was necessary to treat or prevent imminent or life-threatening deterioration of the following conditions:  Sepsis   Critical care was time spent personally by me on the following activities:  Discussions with consultants, evaluation of patient's response to treatment, examination of patient, ordering and performing treatments and interventions, ordering and  review of laboratory studies, ordering and review of radiographic studies, pulse oximetry, re-evaluation of patient's condition, obtaining history from patient or surrogate and review of old charts     Initial Impression / Assessment and Plan / MDM / ED Course  84 y.o. female who presents to the ED for nausea, vomiting, abdominal pain x 3 days.  Labs initiated in triage reveal multiple abnormalities including leukocytosis to 19.  Hyponatremia, hypochloremia, and mild hyperkalemia at 5.3.  Creatinine increased from prior 3.98 from 1.89 two weeks ago. Consistent with vomiting and dehydration & acute on chronic kidney injury. LFTs elevated into the 140s and bilirubin 1.5. Normal lipase.   Bicarb 17.  Concern for acute intra-abdominal infectious etiology, as well as dehydration and acute on chronic kidney disease.  Will plan for fluids, empiric antibiotics.  Obtain cultures and lactic.  Will obtain RUQ ultrasound imaging given tenderness on exam, if unrevealing will proceed with CT.  Clinical Course as of Nov 21 1520  Thu Nov 22, 2019  1503 [place] consistent with acute chole. Discussed with surgery who will evaluate (will likely need a perc tube), advised hospitalist admission given her multiple comorbidities (including AF on anticoagulation) and severity of her illness. Will discuss w/ hospitalist for admission.     [SM]  1508 Lactic significantly elevated, >11. Has received 1 L IVF thus far as well as 200 cc volume ordered from her antibiotics. Will order additional fluids and recheck.   [SM]  1520 Discussed with hospitalist for admission.    [SM]    Clinical Course User Index [SM] Lilia Pro., MD       _______________________________   As part of my medical decision making I have reviewed available labs, radiology tests, reviewed old records/performed chart review, obtained additional history from family, and discussed with consultants (surgery, Dr. Dahlia Byes).     Final Clinical  Impression(s) / ED Diagnosis  Final diagnoses:  Nausea and vomiting  AKI (acute kidney injury) ([place])  Dehydration  Cholecystitis       Note:  This document was prepared using Dragon voice recognition software and may include unintentional dictation errors.   Lilia Pro., MD 2019/11/26 [phone]

## 2019-12-11 NOTE — Consult Note (Addendum)
Ashley Bird MRN: [id number] DOB/AGE: 1936/03/07 84 y.o. Primary Care Physician:Hedrick, Jeneen Rinks, MD Admit date: 27-Nov-2019 Chief Complaint: No chief complaint on file.  HPI: Patient is a 84 year old Caucasian female with a past medical history of  paroxysmal atrial fibrillation  on chronic anticoagulation therapy with Coumadin, diastolic CHF, COPD and coronary artery disease who came to the ER with chief complaint of abdominal pain.  History of present illness dates back to around 3 to 4 days ago when patient started having  abdominal pain, nausea and vomiting.  Abdominal pain is located in the right upper quadrant, it is dull and sensation it is around 8-10 in intensity and has been getting progressively worse  Abdominal pain is associated with multiple episodes of emesis and persistent nausea.   Patient offers no complaint of fever cough or chills No complaint of recent Covid exposure She denies having any fever or chills. As per patient's daughter Patient is unable to tolerate anything by mouth.  Patient daughter is at bedside and informed us that patient knew she had gallbladder disease but patient was hesitant to get her gallbladder taken out.   Upon evaluation in the ER patient was found to have acute kidney injury, transaminitis, hyperkalemia,   acidosis  and lactate of 11 Patient had a gallbladder ultrasound which showed findings compatible with acute calculus cholecystitis.  Nephrology was consulted Patient was seen in the ER Patient main concern continues to be abdominal pain as noted above I had extensive discussion with the patient and her daughter regarding her kidney related issues  Past Medical History:  Diagnosis Date  . CAD (coronary artery disease)   . CHF (congestive heart failure) ([place])   . COPD (chronic obstructive pulmonary disease) ([place])   . Diabetes ([place])   . HLD (hyperlipidemia)   . HTN (hypertension)   . PAF (paroxysmal atrial fibrillation) ([facility])          Family History  Problem Relation Age of Onset  . Colon cancer Mother   . Diabetes Mother   . Asthma Father     Social History:  reports that she quit smoking about 40 years ago. Her smoking use included cigarettes. She has a 60.00 pack-year smoking history. She has never used smokeless tobacco. She reports previous alcohol use. She reports that she does not use drugs.   Allergies:  Allergies  Allergen Reactions  . Alendronate Other (See Comments)    Reaction: unknown  . Doxycycline Swelling    Swelling and hemmorage     . Effexor [Venlafaxine] Other (See Comments)    Body ached   . Ferralet [Iron-Folic [id number]] Nausea And Vomiting    Vomiting   . Ferrous Sulfate Nausea And Vomiting  . Penicillins Swelling    Per pt: swelling and hemmorage     . Prozac [Fluoxetine] Other (See Comments)    Reaction: unknown  . Simvastatin Other (See Comments)    Muscle pain   . Sulfa Antibiotics Other (See Comments)    Reaction: unknown  . Zoloft [Sertraline Hcl] Other (See Comments)    Reaction: unknown    (Not in a hospital admission)      ESP:QZRAQ from the symptoms mentioned above,there are no other symptoms referable to all systems reviewed.  . insulin aspart  0-15 Units Subcutaneous Q4H  . pantoprazole (PROTONIX) IV  40 mg Intravenous Q24H  . sodium chloride flush  3 mL Intravenous Once         TMA:UQJFH from the symptoms mentioned above,there are  no other symptoms referable to all systems reviewed.  Physical Exam: Vital signs in last 24 hours: Temp:  [97.4 F (36.3 C)] 97.4 F (36.3 C) (05/13 1057) Pulse Rate:  [96] 96 (05/13 1057) Resp:  [16] 16 (05/13 1057) BP: (113-125)/(80-84) 113/84 (05/13 1639) SpO2:  [100 %] 100 % (05/13 1057) Weight:  [73.5 kg] 73.5 kg (05/13 1055) Weight change:     Intake/Output from previous day: No intake/output data recorded. Total I/O In: 100 [IV Piggyback:100] Out: -    Physical  Exam: General- pt is awake,alert, oriented to time place and person Resp- No acute REsp distress, CTA B/L NO Rhonchi CVS- S1S2 regular in rate and rhythm GIT- BS+, soft, nondistended, tender in the right quadrant EXT- NO LE Edema, Cyanosis CNS- CN 2-12 grossly intact. Moving all 4 extremities Psych- normal mood and affect   Lab Results: CBC Recent Labs    2019-12-18 1104  WBC 19.0*  HGB 10.2*  HCT 34.2*  PLT 217    BMET Recent Labs    2019/12/18 1104  NA 130*  K 5.3*  CL 95*  CO2 17*  GLUCOSE 168*  BUN 70*  CREATININE 3.98*  CALCIUM 9.0    Anion gap 130-112=18  Albumin 2.9 Delta anion gap 18-8 is equal to 10  Delta bicarb 24-17 equal to 7  Creatinine trend 2021 1.8--2.6 as baseline and now 3.98 2020 1.5--1.7 2019 1.6--2.0   MICRO No results found for this or any previous visit (from the past 240 hour(s)).    Lab Results  Component Value Date   CALCIUM 9.0 2019/12/18   PHOS 3.7 05/03/2018      Impression: 1)Renal  AKI secondary to ATN Patient has AKI secondary to sepsis Patient has AKI on CKD Patient has CKD stage IV Patient has CKD stage IV since 2019-most likely before that Patient CKD progression has now been marked with acute kidney injury Patient CKD is most likely secondary to hypertension. There is possible contribution from age so she declined as patient is more than 84 years old. Educated patient and her daughter about possibly requiring renal replacement therapy   2)HTN Blood pressure stable   3)Anemia of chronic disease  HGb at goal (9--11)   4) secondary hyperparathyroidism CKD Mineral-Bone Disorder  Secondary Hyperparathyroidism  Present. Intact PTH level was elevated with work-up was done as outpatient    5) atrial fibrillation Patient is on Coumadin Primary team is following   6) electrolytes   Hyponatremia Most likely secondary to hypovolemia  Hyperkalemia Hyperkalemia secondary to AKI on CKD  7)Acid  base Co2 not at goal  Patient has a high anion gap secondary to lactic acidosis  8) sepsis Patient has sepsis secondary to acute cholecystitis Patient has multiorgan dysfunction-elevated LFTs, acute kidney injury Patient is currently on broad-spectrum antibiotics  Plan:   Patient has had CT abdomen done-We will wait for the formal report No need for the ultrasound at this time  Agree with the current IV fluids We will ask for FENA   Agree with the IV bicarb drip at this time to help with acidosis We will suggest to ask for ABG  We will follow I's and O's She does not need dialysis today but will most likely require  require renal placement therapy/CRRT   I had extensive discussion regarding this with the patient and patient family.    Addendum Patient clinical condition worsened Patient became tachypneic and hypotensive. I had a discussion with patient and her extended family. Patient  and family do not wish for aggressive treatment. They understand the poor prognosis of septic shock. Family and patient has asked for for palliative care/hospice Patient and family do not wish dialysis/renal replacement therapy  Lamarr Feenstra s Theador Hawthorne December 18, 2019, 5:10 PM

## 2019-12-11 NOTE — Consult Note (Signed)
Pharmacy Antibiotic Note  Ashley Bird is a 84 y.o. female admitted on 12/20/2019 with sepsis complicated by acute renal failure. She further has transaminitis and right upper quadrant US showed acute calculus cholecystitis. Pharmacy has been consulted for meropenem dosing. Patient received ceftriaxone + metronidazole in the ED.   Nephrology is following. No plan for dialysis today but likely will require RRT.  Plan: Meropenem 1 g q24h  Continue to follow nephrology plan / renal function and adjust antibiotics as indicated.   Height: 5\' 3"  (160 cm) Weight: 73.5 kg (162 lb) IBW/kg (Calculated) : 52.4  Temp (24hrs), Avg:97.4 F (36.3 C), Min:97.4 F (36.3 C), Max:97.4 F (36.3 C)  Recent Labs  Lab 20-Dec-2019 1104 December 20, 2019 1418  WBC 19.0*  --   CREATININE 3.98*  --   LATICACIDVEN  --  >11.0*    Estimated Creatinine Clearance: 10.6 mL/min (A) (by C-G formula based on SCr of 3.98 mg/dL (H)).    Allergies  Allergen Reactions  . Alendronate Other (See Comments)    Reaction: unknown  . Doxycycline Swelling    Swelling and hemmorage     . Effexor [Venlafaxine] Other (See Comments)    Body ached   . Ferralet [Iron-Folic [id number]] Nausea And Vomiting    Vomiting   . Ferrous Sulfate Nausea And Vomiting  . Penicillins Swelling    Per pt: swelling and hemmorage     . Prozac [Fluoxetine] Other (See Comments)    Reaction: unknown  . Simvastatin Other (See Comments)    Muscle pain   . Sulfa Antibiotics Other (See Comments)    Reaction: unknown  . Zoloft [Sertraline Hcl] Other (See Comments)    Reaction: unknown    Antimicrobials this admission: 5/13 Ceftriaxone 2 g x 1 5/13 Metronidazole 500 mg x 1   Meropenem 5/13 >>   Dose adjustments this admission: n/a  Microbiology results: 5/13 BCx: pending  Thank you for allowing pharmacy to be a part of this patient's care.  Aneth Resident 2019-12-20 5:49 PM

## 2019-12-11 NOTE — Progress Notes (Signed)
   2019-12-10 1613  Clinical Encounter Type  Visited With Patient and family together  Visit Type Initial  Referral From Nurse  Consult/Referral To Chaplain  Spiritual Encounters  Spiritual Needs Prayer;Emotional  [facility] received page at 1613 to visit patient in ED-2. Met with patient's RN and he informed [place] that pt was about to be placed on comfort care. Pt was awake upon arrival but seemed tired. Was with daughters and pt's pastor was on his way. Pt was able to hold meaningful conversation. Family members were teary eyed throughout visit. [place] prayed with pt upon request. Pt thanked [facility] for prayer. No further needs were expressed at this time.

## 2019-12-11 DEATH — deceased

## 2020-12-16 IMAGING — US US ABDOMEN LIMITED
1 series · 14 of 25 positions shown · non-contrast
Comparison: None.

CLINICAL DATA: Right upper quadrant pain

EXAM:
ULTRASOUND ABDOMEN LIMITED RIGHT UPPER QUADRANT

[Series 1: us abdomen limited ruq · 14 of 61 slices shown]
[im 1/61]
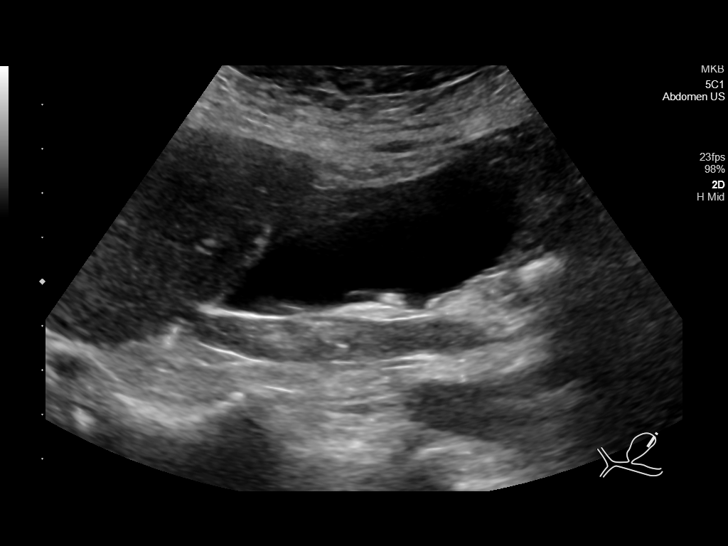
[im 6/61]
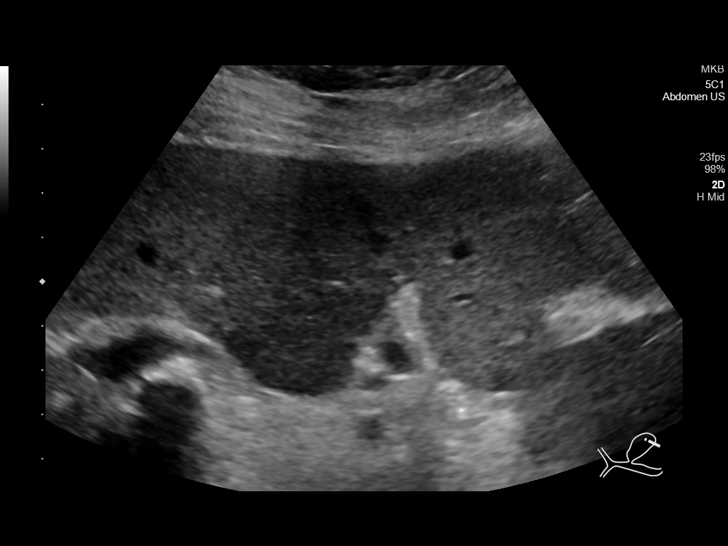
[im 11/61]
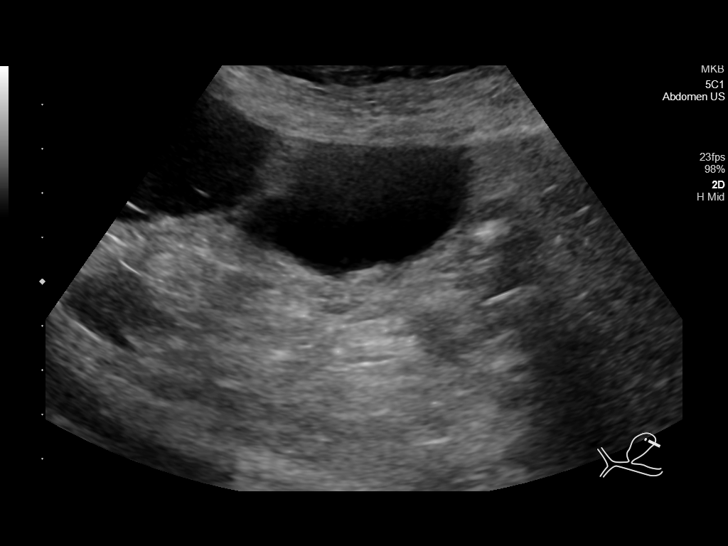
[im 16/61]
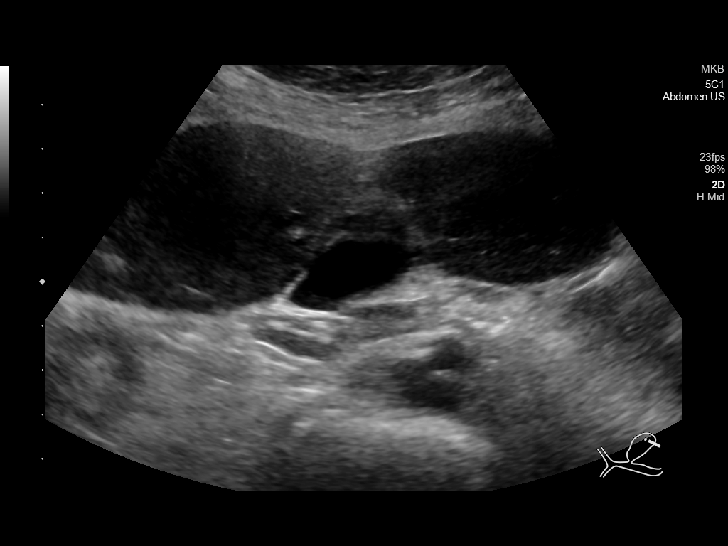
[im 21/61]
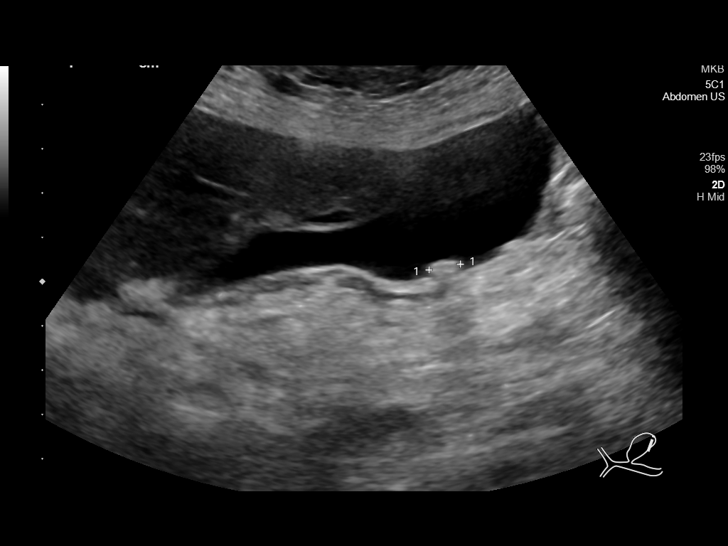
[im 23/61]
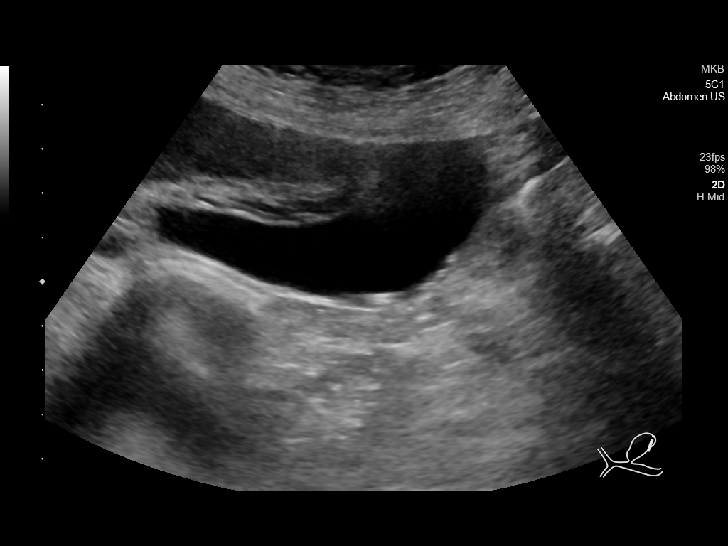
[im 28/61]
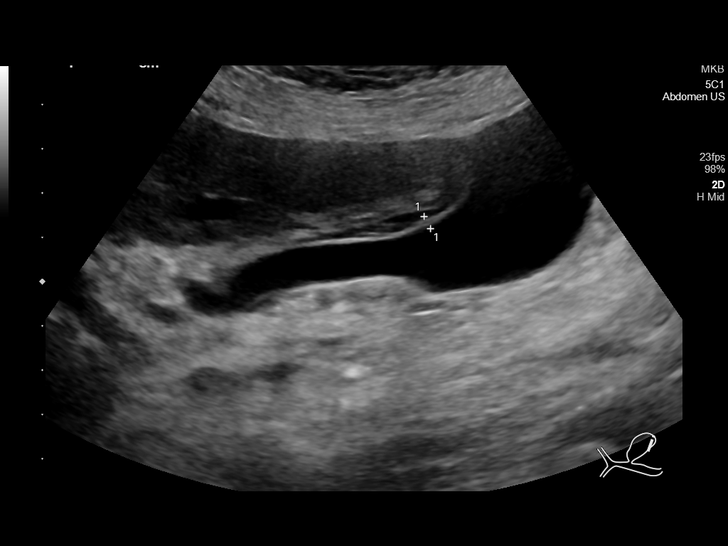
[im 33/61]
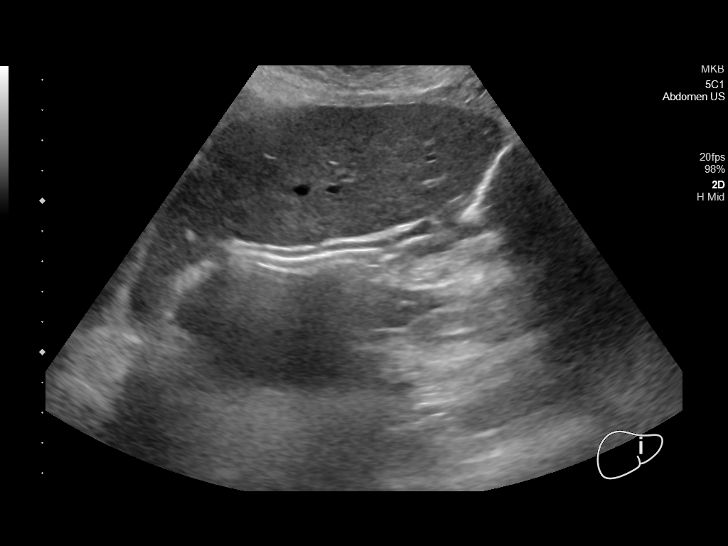
[im 38/61]
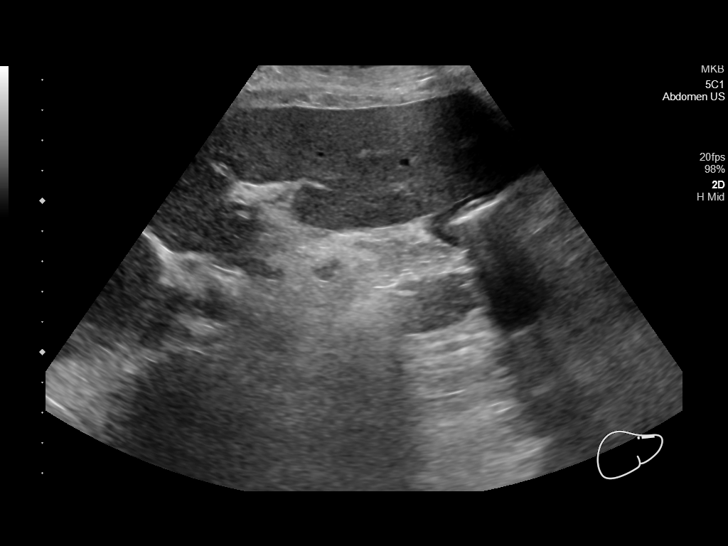
[im 41/61]
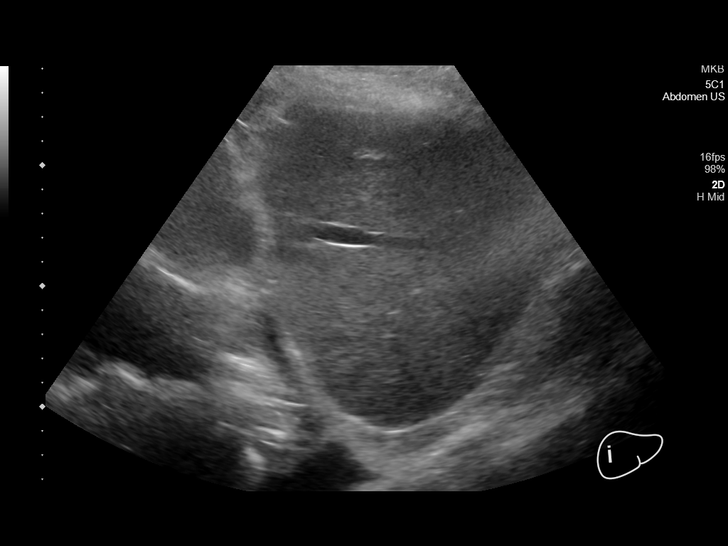
[im 46/61]
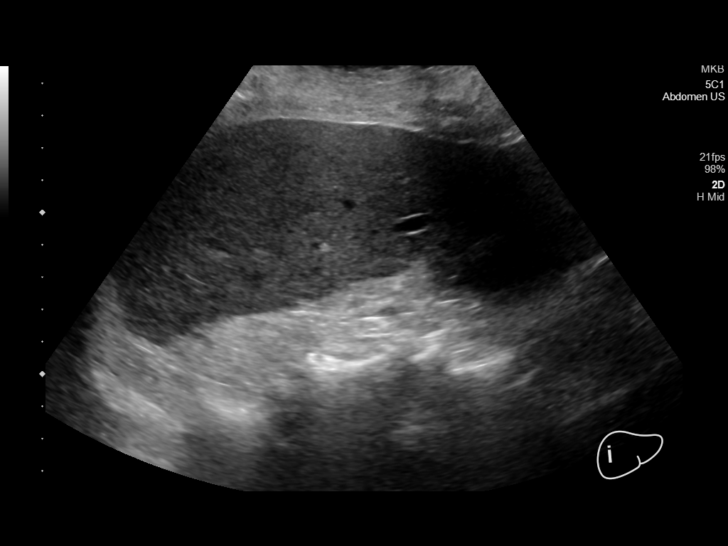
[im 51/61]
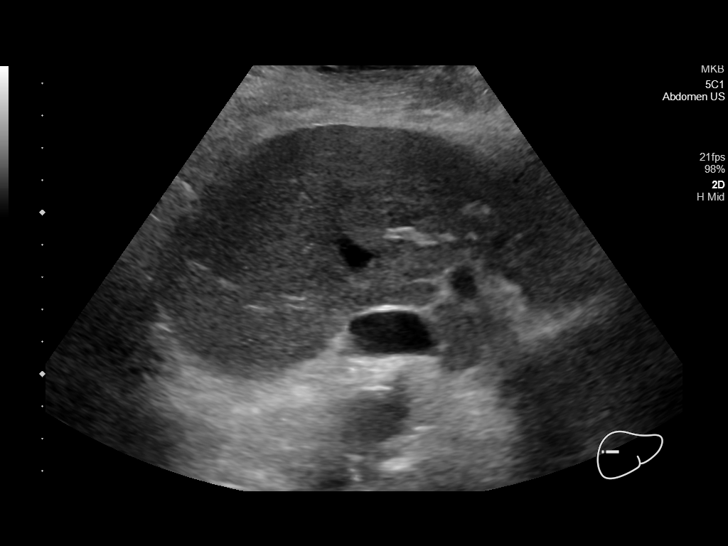
[im 56/61]
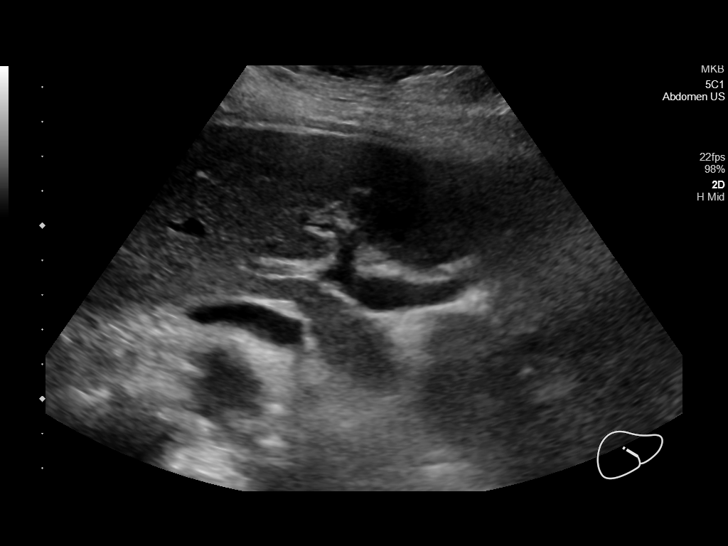
[im 61/61]
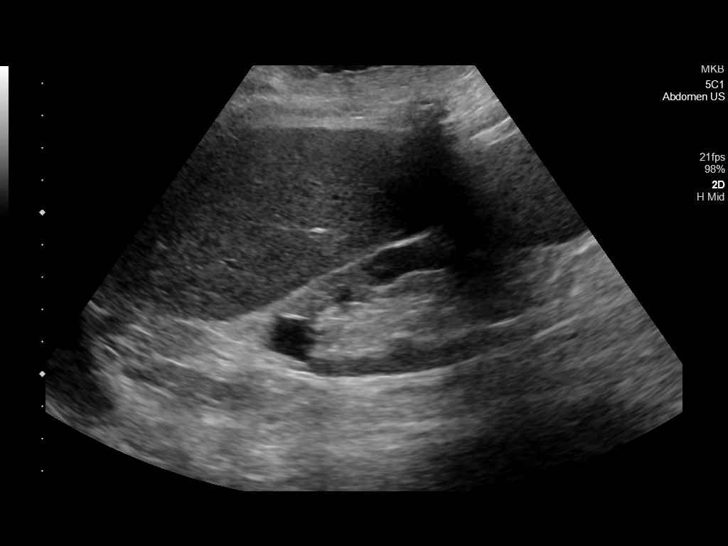

[14 of 25 positions shown; findings below may reference images not displayed]

FINDINGS: Gallbladder:

Mildly distended gallbladder with multiple hyperdense, shadowing
stones. Largest stone measures up to 7 mm. Pericholecystic edema
with wall thickening measuring up to 9 mm. Sonographer reports a
positive Murphy sign.

Common bile duct:

Diameter: 5 mm

Liver:

No focal lesion identified. Echogenic hepatic parenchyma with a
subtly nodular surface contour. Portal vein is patent on color
Doppler imaging with normal direction of blood flow towards the
liver.

Other: Visualized right kidney demonstrates mild cortical thinning
with increased parenchymal echogenicity. A small cyst is noted at
the upper pole. A right pleural effusion is partially visualized.
IMPRESSION: 1. Sonographic findings compatible with acute calculus
cholecystitis.
2. Right pleural effusion.
3. The appearance of the liver suggests underlying cirrhosis.
Correlate with liver function tests.

## 2020-12-16 IMAGING — CT CT ABD-PELV W/O CM
2 of 4 series · 16 of 46 positions shown, 18 images · non-contrast
Comparison: Right upper quadrant ultrasound on 11/22/2018

CLINICAL DATA: Abdominal pain and nausea for 3 days.

EXAM:
CT ABDOMEN AND PELVIS WITHOUT CONTRAST
TECHNIQUE: Multidetector CT imaging of the abdomen and pelvis was performed
following the standard protocol without IV contrast.

[Series 2: axial st · axial · 0.97mm/px · z∈[-368,+12]mm · 13 of 84 slices shown, 15 images]
[im 4/84  soft-tissue]
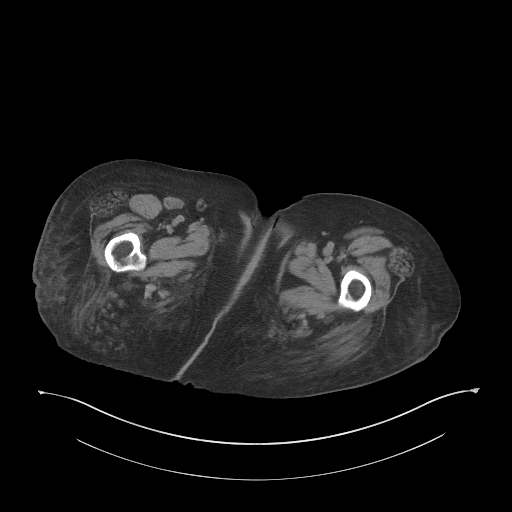
[im 4/84  bone]
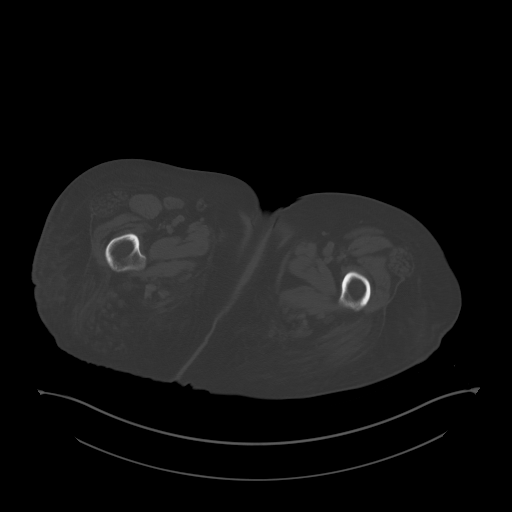
[im 10/84  soft-tissue]
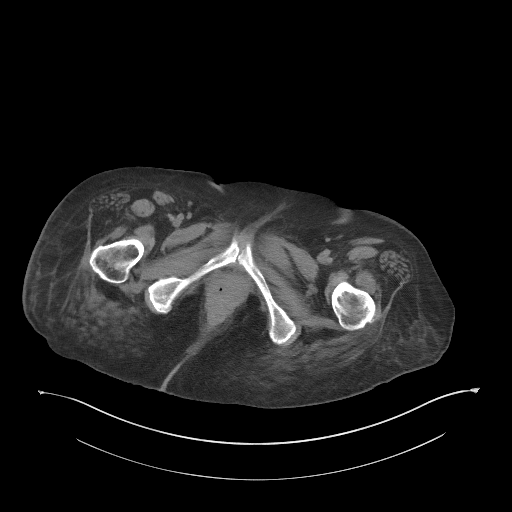
[im 16/84  soft-tissue]
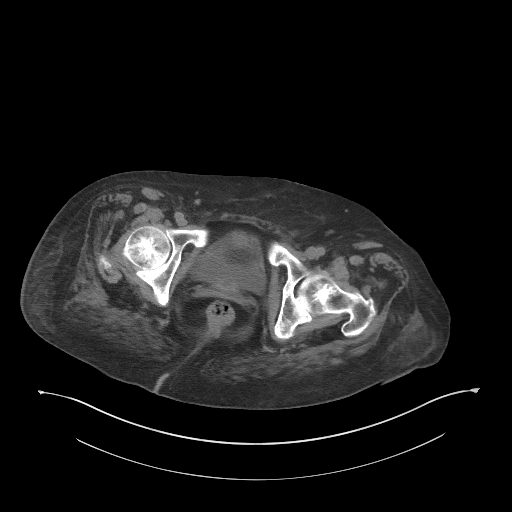
[im 23/84  soft-tissue]
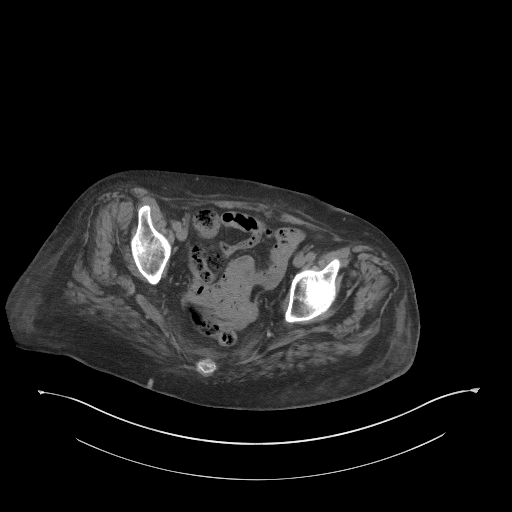
[im 29/84  soft-tissue]
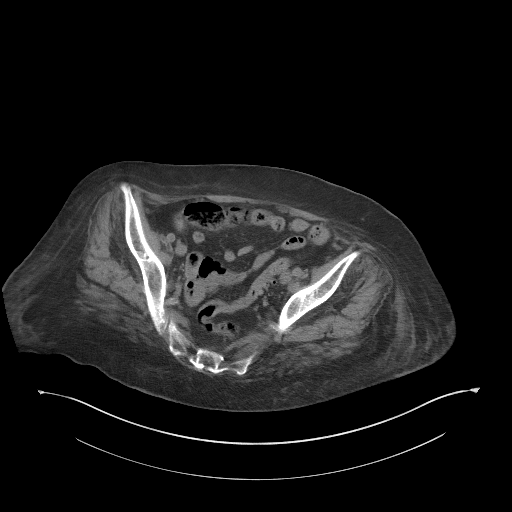
[im 36/84  soft-tissue]
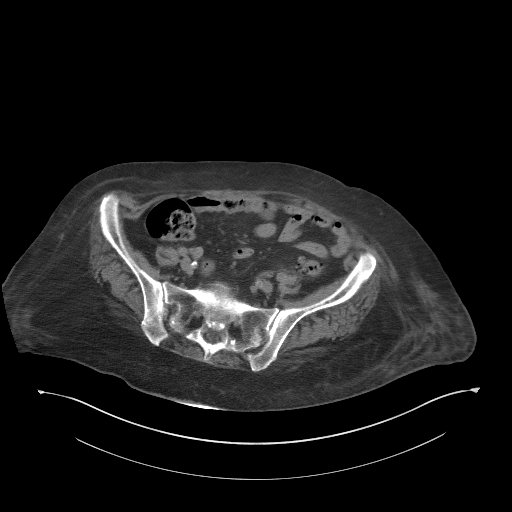
[im 42/84  soft-tissue]
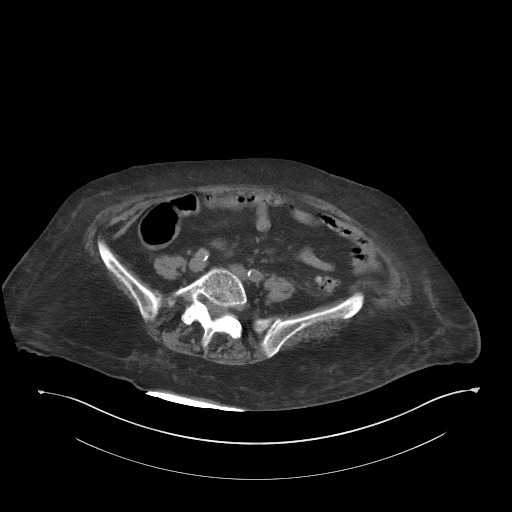
[im 48/84  soft-tissue]
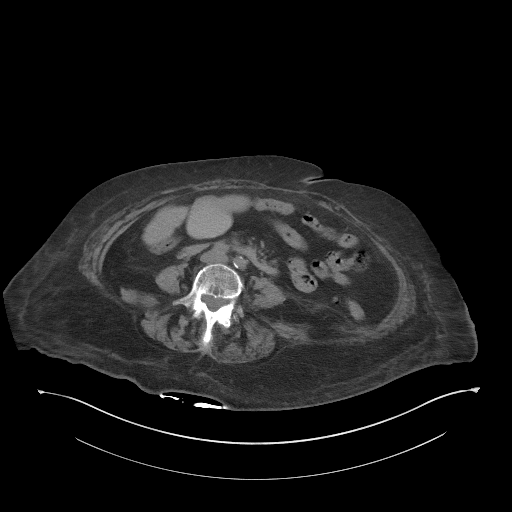
[im 55/84  soft-tissue]
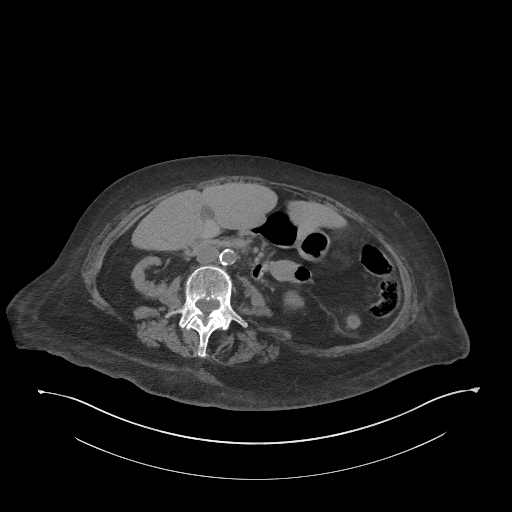
[im 55/84  bone]
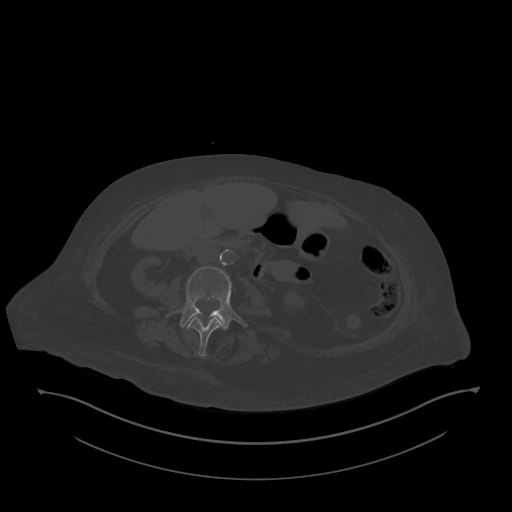
[im 61/84  soft-tissue]
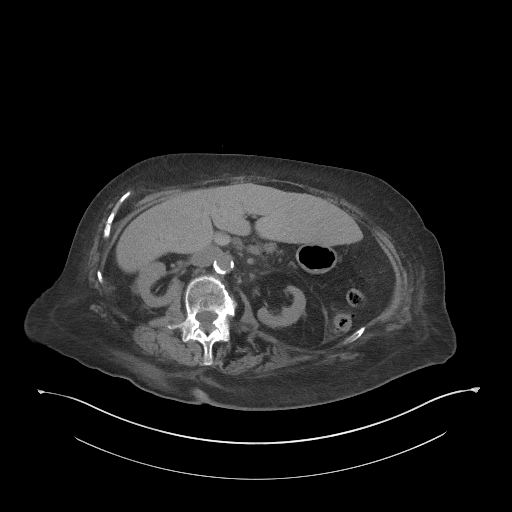
[im 68/84  soft-tissue]
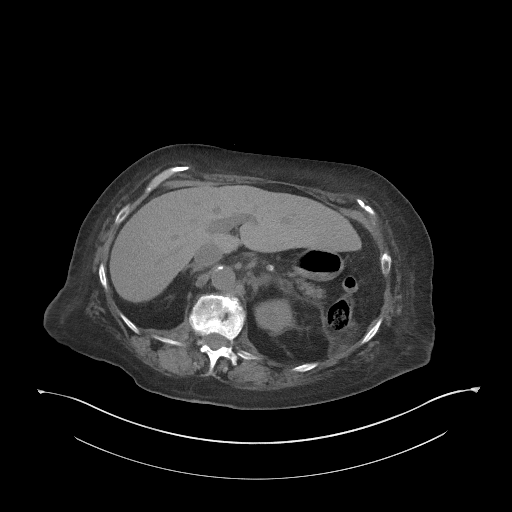
[im 74/84  soft-tissue]
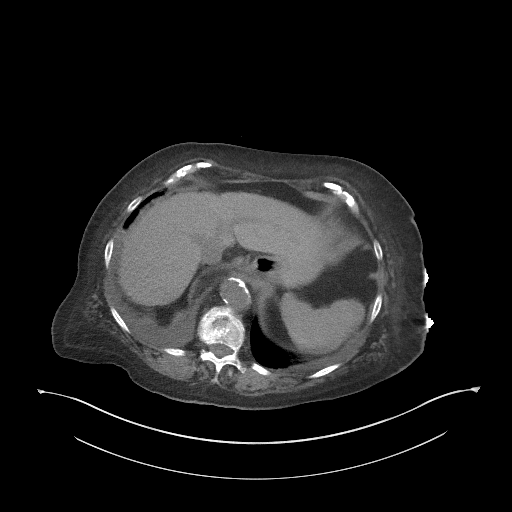
[im 80/84  soft-tissue]
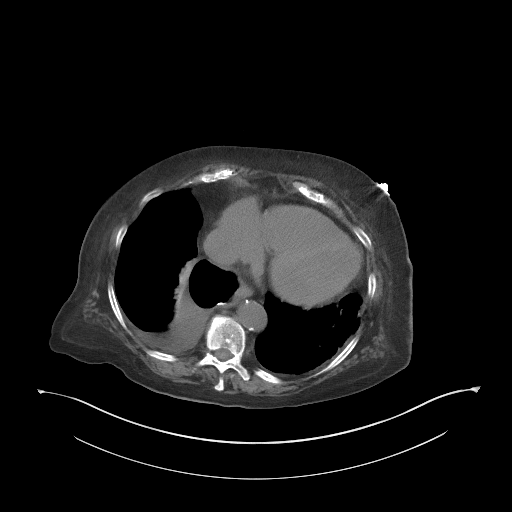

[Series 5: coronal st · coronal · 0.85mm/px · 3 of 81 slices shown]
[im 27/81  soft-tissue]
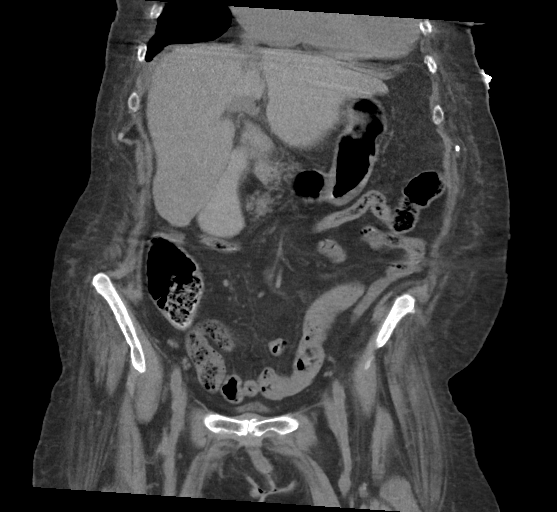
[im 36/81  soft-tissue]
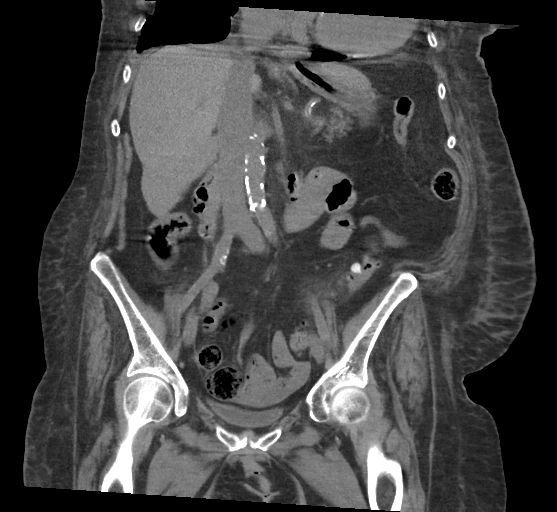
[im 45/81  soft-tissue]
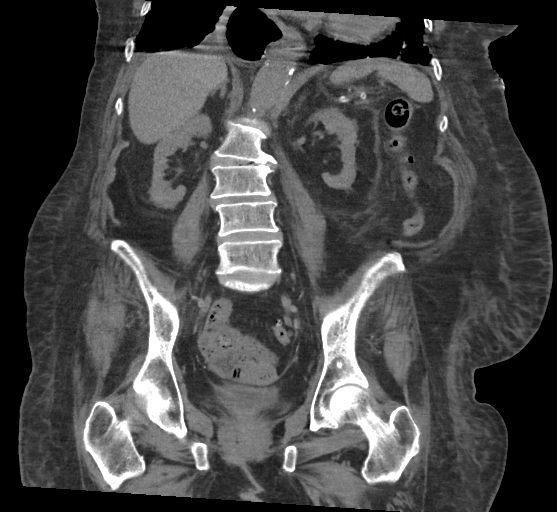

[16 of 46 positions shown; findings below may reference images not displayed]

FINDINGS: Lower chest: Small right pleural effusion. Bibasilar atelectasis.

Hepatobiliary: No mass visualized on this unenhanced exam. Mild left
hepatic lobe hypertrophy and capsular nodularity is suspicious for
cirrhosis. The gallbladder contains high attenuation sludge and a
tiny less than 1 cm calcified gallstone. No evidence of gallbladder
wall thickening or pericholecystic inflammatory changes. No evidence
of biliary ductal dilatation.

Pancreas: No mass or inflammatory process visualized on this
unenhanced exam.

Spleen:  Within normal limits in size.

Adrenals/Urinary tract: No evidence of urolithiasis or
hydronephrosis. A few tiny fluid attenuation renal cysts are noted
bilaterally. Unremarkable unopacified urinary bladder.

Stomach/Bowel: Moderate hiatal hernia is seen. No evidence of
obstruction, inflammatory process, or abnormal fluid collections.
Diverticulosis is seen involving the descending and sigmoid colon.
Mild soft tissue stranding is seen in the pericolonic fat adjacent
to the descending proximal sigmoid colon, suspicious for mild
diverticulitis. No evidence of extraluminal air or abscess.

Vascular/Lymphatic: No pathologically enlarged lymph nodes
identified. No evidence of abdominal aortic aneurysm. Aortic
atherosclerosis noted.

Reproductive: Prior hysterectomy noted. Adnexal regions are
unremarkable in appearance.

Other: Diffuse mesenteric and body wall edema noted. No evidence of
ascites.

Musculoskeletal:  No suspicious bone lesions identified.
IMPRESSION: 1. Suspect mild diverticulitis involving the descending and proximal
sigmoid colon. No evidence of abscess or other complication.
2. Probable hepatic cirrhosis.  No evidence of ascites.
3. Diffuse mesenteric and body wall edema and small right pleural
effusion.
4. Moderate hiatal hernia.
5. Cholelithiasis and gallbladder sludge. No radiographic evidence
of cholecystitis.

Aortic Atherosclerosis (ST1G3-USI.I).
